# Patient Record
Sex: Female | Born: 1950 | Race: Black or African American | Hispanic: No | Smoking: Never smoker
Health system: Southern US, Community
[De-identification: ages and names within clinical notes are randomized; demographics above are authoritative.]

## PROBLEM LIST (undated history)

## (undated) DIAGNOSIS — E785 Hyperlipidemia, unspecified: Secondary | ICD-10-CM

## (undated) DIAGNOSIS — I10 Essential (primary) hypertension: Secondary | ICD-10-CM

## (undated) DIAGNOSIS — D72829 Elevated white blood cell count, unspecified: Secondary | ICD-10-CM

## (undated) DIAGNOSIS — E119 Type 2 diabetes mellitus without complications: Secondary | ICD-10-CM

## (undated) DIAGNOSIS — M1990 Unspecified osteoarthritis, unspecified site: Secondary | ICD-10-CM

## (undated) DIAGNOSIS — R1310 Dysphagia, unspecified: Secondary | ICD-10-CM

## (undated) DIAGNOSIS — R413 Other amnesia: Secondary | ICD-10-CM

## (undated) HISTORY — PX: TUBAL LIGATION: SHX77

## (undated) HISTORY — DX: Type 2 diabetes mellitus without complications: E11.9

## (undated) HISTORY — DX: Unspecified osteoarthritis, unspecified site: M19.90

## (undated) HISTORY — DX: Other amnesia: R41.3

## (undated) HISTORY — DX: Essential (primary) hypertension: I10

## (undated) HISTORY — DX: Hyperlipidemia, unspecified: E78.5

## (undated) HISTORY — DX: Dysphagia, unspecified: R13.10

## (undated) HISTORY — DX: Elevated white blood cell count, unspecified: D72.829

---

## 1997-03-07 DIAGNOSIS — M1990 Unspecified osteoarthritis, unspecified site: Secondary | ICD-10-CM

## 1997-03-07 HISTORY — DX: Unspecified osteoarthritis, unspecified site: M19.90

## 1998-07-24 ENCOUNTER — Encounter: Payer: Self-pay | Admitting: Emergency Medicine

## 1998-07-24 ENCOUNTER — Emergency Department (HOSPITAL_COMMUNITY): Admission: EM | Admit: 1998-07-24 | Discharge: 1998-07-24 | Payer: Self-pay | Admitting: Emergency Medicine

## 1999-04-16 ENCOUNTER — Encounter: Payer: Self-pay | Admitting: Obstetrics and Gynecology

## 1999-04-16 ENCOUNTER — Encounter: Admission: RE | Admit: 1999-04-16 | Discharge: 1999-04-16 | Payer: Self-pay | Admitting: Obstetrics and Gynecology

## 1999-10-22 ENCOUNTER — Encounter: Payer: Self-pay | Admitting: Obstetrics and Gynecology

## 1999-10-22 ENCOUNTER — Other Ambulatory Visit: Admission: RE | Admit: 1999-10-22 | Discharge: 1999-10-22 | Payer: Self-pay | Admitting: Obstetrics and Gynecology

## 1999-10-22 ENCOUNTER — Encounter: Admission: RE | Admit: 1999-10-22 | Discharge: 1999-10-22 | Payer: Self-pay | Admitting: Obstetrics and Gynecology

## 2000-11-27 ENCOUNTER — Encounter: Admission: RE | Admit: 2000-11-27 | Discharge: 2000-11-27 | Payer: Self-pay | Admitting: Family Medicine

## 2000-11-27 ENCOUNTER — Encounter: Payer: Self-pay | Admitting: Family Medicine

## 2002-02-13 ENCOUNTER — Ambulatory Visit (HOSPITAL_COMMUNITY): Admission: RE | Admit: 2002-02-13 | Discharge: 2002-02-13 | Payer: Self-pay | Admitting: Family Medicine

## 2003-03-08 DIAGNOSIS — E119 Type 2 diabetes mellitus without complications: Secondary | ICD-10-CM

## 2003-03-08 HISTORY — DX: Type 2 diabetes mellitus without complications: E11.9

## 2003-05-29 ENCOUNTER — Ambulatory Visit (HOSPITAL_COMMUNITY): Admission: RE | Admit: 2003-05-29 | Discharge: 2003-05-29 | Payer: Self-pay | Admitting: Family Medicine

## 2003-06-11 ENCOUNTER — Ambulatory Visit (HOSPITAL_COMMUNITY): Admission: RE | Admit: 2003-06-11 | Discharge: 2003-06-11 | Payer: Self-pay | Admitting: Family Medicine

## 2003-07-03 ENCOUNTER — Ambulatory Visit (HOSPITAL_COMMUNITY): Admission: RE | Admit: 2003-07-03 | Discharge: 2003-07-03 | Payer: Self-pay | Admitting: Family Medicine

## 2003-09-08 ENCOUNTER — Inpatient Hospital Stay (HOSPITAL_COMMUNITY): Admission: EM | Admit: 2003-09-08 | Discharge: 2003-09-11 | Payer: Self-pay | Admitting: Emergency Medicine

## 2003-09-09 ENCOUNTER — Encounter (INDEPENDENT_AMBULATORY_CARE_PROVIDER_SITE_OTHER): Payer: Self-pay | Admitting: Cardiology

## 2003-11-24 ENCOUNTER — Ambulatory Visit: Payer: Self-pay | Admitting: Family Medicine

## 2003-11-27 ENCOUNTER — Ambulatory Visit: Payer: Self-pay | Admitting: Family Medicine

## 2004-02-23 ENCOUNTER — Ambulatory Visit: Payer: Self-pay | Admitting: Family Medicine

## 2004-03-16 ENCOUNTER — Ambulatory Visit: Payer: Self-pay | Admitting: Family Medicine

## 2004-03-23 ENCOUNTER — Ambulatory Visit: Payer: Self-pay | Admitting: Family Medicine

## 2004-03-30 ENCOUNTER — Ambulatory Visit: Payer: Self-pay | Admitting: Family Medicine

## 2004-06-10 ENCOUNTER — Ambulatory Visit: Payer: Self-pay | Admitting: Family Medicine

## 2004-06-22 ENCOUNTER — Ambulatory Visit (HOSPITAL_COMMUNITY): Admission: RE | Admit: 2004-06-22 | Discharge: 2004-06-22 | Payer: Self-pay | Admitting: Family Medicine

## 2004-07-30 ENCOUNTER — Ambulatory Visit: Payer: Self-pay | Admitting: *Deleted

## 2004-10-01 ENCOUNTER — Ambulatory Visit: Payer: Self-pay | Admitting: Family Medicine

## 2004-12-21 ENCOUNTER — Ambulatory Visit: Payer: Self-pay | Admitting: Family Medicine

## 2005-05-05 ENCOUNTER — Ambulatory Visit: Payer: Self-pay | Admitting: Family Medicine

## 2005-05-05 LAB — CONVERTED CEMR LAB: Urinalysis: NORMAL

## 2005-08-09 ENCOUNTER — Ambulatory Visit (HOSPITAL_COMMUNITY): Admission: RE | Admit: 2005-08-09 | Discharge: 2005-08-09 | Payer: Self-pay | Admitting: Family Medicine

## 2005-09-19 ENCOUNTER — Encounter (INDEPENDENT_AMBULATORY_CARE_PROVIDER_SITE_OTHER): Payer: Self-pay | Admitting: Family Medicine

## 2005-09-19 ENCOUNTER — Ambulatory Visit: Payer: Self-pay | Admitting: Nurse Practitioner

## 2005-09-19 LAB — CONVERTED CEMR LAB
TSH: 1.048 microintl units/mL
WBC, blood: 9.6 10*3/uL

## 2005-10-20 ENCOUNTER — Ambulatory Visit: Payer: Self-pay | Admitting: Family Medicine

## 2006-01-16 ENCOUNTER — Ambulatory Visit: Payer: Self-pay | Admitting: Family Medicine

## 2006-02-22 ENCOUNTER — Ambulatory Visit: Payer: Self-pay | Admitting: Family Medicine

## 2006-03-27 ENCOUNTER — Ambulatory Visit: Payer: Self-pay | Admitting: Internal Medicine

## 2006-05-22 ENCOUNTER — Ambulatory Visit: Payer: Self-pay | Admitting: Family Medicine

## 2006-05-22 LAB — CONVERTED CEMR LAB
Hgb A1c MFr Bld: 5.7 %
Microalbumin U total vol: 0.53 mg/L

## 2006-07-27 ENCOUNTER — Ambulatory Visit: Payer: Self-pay | Admitting: Family Medicine

## 2006-08-15 ENCOUNTER — Ambulatory Visit (HOSPITAL_COMMUNITY): Admission: RE | Admit: 2006-08-15 | Discharge: 2006-08-15 | Payer: Self-pay | Admitting: Family Medicine

## 2006-11-16 ENCOUNTER — Encounter (INDEPENDENT_AMBULATORY_CARE_PROVIDER_SITE_OTHER): Payer: Self-pay | Admitting: Family Medicine

## 2006-11-16 DIAGNOSIS — E7800 Pure hypercholesterolemia, unspecified: Secondary | ICD-10-CM | POA: Insufficient documentation

## 2006-11-22 ENCOUNTER — Encounter (INDEPENDENT_AMBULATORY_CARE_PROVIDER_SITE_OTHER): Payer: Self-pay | Admitting: *Deleted

## 2006-12-21 ENCOUNTER — Ambulatory Visit: Payer: Self-pay | Admitting: Family Medicine

## 2006-12-21 LAB — CONVERTED CEMR LAB
ALT: 19 units/L (ref 0–35)
Calcium: 9.3 mg/dL (ref 8.4–10.5)
Creatinine, Ser: 0.69 mg/dL (ref 0.40–1.20)
HDL: 62 mg/dL (ref >39)
LDL Cholesterol: 88 mg/dL (ref 0–99)
Total CHOL/HDL Ratio: 2.6
VLDL: 13 mg/dL (ref 0–40)

## 2006-12-25 DIAGNOSIS — E669 Obesity, unspecified: Secondary | ICD-10-CM | POA: Insufficient documentation

## 2006-12-25 DIAGNOSIS — E119 Type 2 diabetes mellitus without complications: Secondary | ICD-10-CM | POA: Insufficient documentation

## 2006-12-25 DIAGNOSIS — M76899 Other specified enthesopathies of unspecified lower limb, excluding foot: Secondary | ICD-10-CM | POA: Insufficient documentation

## 2006-12-25 DIAGNOSIS — J8409 Other alveolar and parieto-alveolar conditions: Secondary | ICD-10-CM | POA: Insufficient documentation

## 2007-03-19 ENCOUNTER — Ambulatory Visit: Payer: Self-pay | Admitting: Internal Medicine

## 2007-03-21 ENCOUNTER — Ambulatory Visit: Payer: Self-pay | Admitting: Family Medicine

## 2007-04-26 ENCOUNTER — Ambulatory Visit: Payer: Self-pay | Admitting: Family Medicine

## 2007-04-26 ENCOUNTER — Encounter (INDEPENDENT_AMBULATORY_CARE_PROVIDER_SITE_OTHER): Payer: Self-pay | Admitting: Family Medicine

## 2007-07-02 ENCOUNTER — Ambulatory Visit: Payer: Self-pay | Admitting: Internal Medicine

## 2007-07-02 ENCOUNTER — Encounter (INDEPENDENT_AMBULATORY_CARE_PROVIDER_SITE_OTHER): Payer: Self-pay | Admitting: Family Medicine

## 2007-07-02 LAB — CONVERTED CEMR LAB
ALT: 16 units/L (ref 0–35)
AST: 19 units/L (ref 0–37)
Albumin: 4.3 g/dL (ref 3.5–5.2)
CO2: 24 meq/L (ref 19–32)
Calcium: 9.2 mg/dL (ref 8.4–10.5)
Potassium: 4.5 meq/L (ref 3.5–5.3)
Sodium: 140 meq/L (ref 135–145)

## 2007-08-21 ENCOUNTER — Ambulatory Visit (HOSPITAL_COMMUNITY): Admission: RE | Admit: 2007-08-21 | Discharge: 2007-08-21 | Payer: Self-pay | Admitting: Family Medicine

## 2007-08-28 ENCOUNTER — Emergency Department (HOSPITAL_COMMUNITY): Admission: EM | Admit: 2007-08-28 | Discharge: 2007-08-28 | Payer: Self-pay | Admitting: Family Medicine

## 2007-09-21 ENCOUNTER — Ambulatory Visit: Payer: Self-pay | Admitting: Family Medicine

## 2007-09-28 ENCOUNTER — Ambulatory Visit (HOSPITAL_COMMUNITY): Admission: RE | Admit: 2007-09-28 | Discharge: 2007-09-28 | Payer: Self-pay | Admitting: Family Medicine

## 2008-02-08 ENCOUNTER — Ambulatory Visit: Payer: Self-pay | Admitting: Internal Medicine

## 2008-05-19 ENCOUNTER — Ambulatory Visit: Payer: Self-pay | Admitting: Internal Medicine

## 2008-05-19 ENCOUNTER — Encounter: Payer: Self-pay | Admitting: Internal Medicine

## 2008-05-19 DIAGNOSIS — J019 Acute sinusitis, unspecified: Secondary | ICD-10-CM | POA: Insufficient documentation

## 2008-05-19 LAB — CONVERTED CEMR LAB: Blood Glucose, Fingerstick: 140

## 2008-06-07 ENCOUNTER — Emergency Department (HOSPITAL_COMMUNITY): Admission: EM | Admit: 2008-06-07 | Discharge: 2008-06-07 | Payer: Self-pay | Admitting: Emergency Medicine

## 2008-06-09 ENCOUNTER — Ambulatory Visit: Payer: Self-pay | Admitting: Family Medicine

## 2008-06-09 ENCOUNTER — Encounter: Payer: Self-pay | Admitting: Internal Medicine

## 2008-06-09 DIAGNOSIS — R519 Headache, unspecified: Secondary | ICD-10-CM | POA: Insufficient documentation

## 2008-06-09 DIAGNOSIS — R51 Headache: Secondary | ICD-10-CM | POA: Insufficient documentation

## 2008-06-09 LAB — CONVERTED CEMR LAB: Blood Glucose, Fingerstick: 127

## 2008-06-12 ENCOUNTER — Telehealth: Payer: Self-pay | Admitting: Internal Medicine

## 2008-06-19 ENCOUNTER — Ambulatory Visit: Payer: Self-pay | Admitting: Family Medicine

## 2008-06-19 LAB — CONVERTED CEMR LAB
ALT: 18 units/L (ref 0–35)
LDL Cholesterol: 112 mg/dL — ABNORMAL HIGH (ref 0–99)

## 2008-08-26 ENCOUNTER — Ambulatory Visit (HOSPITAL_COMMUNITY): Admission: RE | Admit: 2008-08-26 | Discharge: 2008-08-26 | Payer: Self-pay | Admitting: Family Medicine

## 2008-09-18 ENCOUNTER — Ambulatory Visit: Payer: Self-pay | Admitting: Family Medicine

## 2009-01-05 ENCOUNTER — Ambulatory Visit: Payer: Self-pay | Admitting: Family Medicine

## 2009-01-06 ENCOUNTER — Encounter (INDEPENDENT_AMBULATORY_CARE_PROVIDER_SITE_OTHER): Payer: Self-pay | Admitting: Family Medicine

## 2009-01-06 LAB — CONVERTED CEMR LAB
Chloride: 107 meq/L (ref 96–112)
Cholesterol: 145 mg/dL (ref 0–200)
HDL: 46 mg/dL (ref >39)
Potassium: 4.1 meq/L (ref 3.5–5.3)
Sodium: 143 meq/L (ref 135–145)
Total CHOL/HDL Ratio: 3.2
Triglycerides: 67 mg/dL (ref <150)
VLDL: 13 mg/dL (ref 0–40)

## 2009-01-20 ENCOUNTER — Ambulatory Visit: Payer: Self-pay | Admitting: Family Medicine

## 2009-04-07 ENCOUNTER — Ambulatory Visit: Payer: Self-pay | Admitting: Internal Medicine

## 2009-05-20 ENCOUNTER — Ambulatory Visit: Payer: Self-pay | Admitting: Family Medicine

## 2009-05-20 LAB — CONVERTED CEMR LAB
BUN: 18 mg/dL (ref 6–23)
CO2: 30 meq/L (ref 19–32)
Creatinine, Ser: 0.77 mg/dL (ref 0.40–1.20)
Glucose, Bld: 100 mg/dL — ABNORMAL HIGH (ref 70–99)
Sodium: 142 meq/L (ref 135–145)
Total Bilirubin: 0.4 mg/dL (ref 0.3–1.2)
Total Protein: 6.3 g/dL (ref 6.0–8.3)

## 2009-06-11 ENCOUNTER — Ambulatory Visit: Payer: Self-pay | Admitting: Family Medicine

## 2009-06-12 ENCOUNTER — Encounter (INDEPENDENT_AMBULATORY_CARE_PROVIDER_SITE_OTHER): Payer: Self-pay | Admitting: Family Medicine

## 2009-06-23 ENCOUNTER — Encounter (INDEPENDENT_AMBULATORY_CARE_PROVIDER_SITE_OTHER): Payer: Self-pay | Admitting: Adult Health

## 2009-06-23 ENCOUNTER — Ambulatory Visit (HOSPITAL_COMMUNITY): Admission: RE | Admit: 2009-06-23 | Discharge: 2009-06-23 | Payer: Self-pay | Admitting: Family Medicine

## 2009-06-23 ENCOUNTER — Telehealth (INDEPENDENT_AMBULATORY_CARE_PROVIDER_SITE_OTHER): Payer: Self-pay | Admitting: *Deleted

## 2009-06-23 ENCOUNTER — Ambulatory Visit: Payer: Self-pay | Admitting: Family Medicine

## 2009-06-23 LAB — CONVERTED CEMR LAB
Eosinophils Absolute: 0 10*3/uL (ref 0.0–0.7)
Hemoglobin: 14 g/dL (ref 12.0–15.0)
Lymphs Abs: 3.3 10*3/uL (ref 0.7–4.0)
MCV: 90.2 fL (ref 78.0–100.0)
Monocytes Relative: 10 % (ref 3–12)
Neutrophils Relative %: 57 % (ref 43–77)
RBC: 4.78 M/uL (ref 3.87–5.11)
WBC: 9.9 10*3/uL (ref 4.0–10.5)

## 2009-06-25 ENCOUNTER — Ambulatory Visit: Payer: Self-pay | Admitting: Internal Medicine

## 2009-06-25 ENCOUNTER — Emergency Department (HOSPITAL_COMMUNITY): Admission: EM | Admit: 2009-06-25 | Discharge: 2009-06-25 | Payer: Self-pay | Admitting: Emergency Medicine

## 2009-08-28 ENCOUNTER — Ambulatory Visit (HOSPITAL_COMMUNITY): Admission: RE | Admit: 2009-08-28 | Discharge: 2009-08-28 | Payer: Self-pay | Admitting: Family Medicine

## 2010-02-02 ENCOUNTER — Emergency Department (HOSPITAL_COMMUNITY)
Admission: EM | Admit: 2010-02-02 | Discharge: 2010-02-02 | Payer: Self-pay | Source: Home / Self Care | Admitting: Emergency Medicine

## 2010-02-22 ENCOUNTER — Ambulatory Visit (HOSPITAL_COMMUNITY)
Admission: RE | Admit: 2010-02-22 | Discharge: 2010-02-22 | Payer: Self-pay | Source: Home / Self Care | Attending: Family Medicine | Admitting: Family Medicine

## 2010-03-04 ENCOUNTER — Encounter (INDEPENDENT_AMBULATORY_CARE_PROVIDER_SITE_OTHER): Payer: Self-pay | Admitting: Family Medicine

## 2010-03-04 LAB — CONVERTED CEMR LAB
ALT: 9 units/L (ref 0–35)
CO2: 29 meq/L (ref 19–32)
Chloride: 103 meq/L (ref 96–112)
Creatinine, Ser: 0.75 mg/dL (ref 0.40–1.20)
HDL: 55 mg/dL (ref >39)
LDL Cholesterol: 85 mg/dL (ref 0–99)
Sodium: 142 meq/L (ref 135–145)
Total CHOL/HDL Ratio: 2.8

## 2010-03-28 ENCOUNTER — Encounter: Payer: Self-pay | Admitting: Internal Medicine

## 2010-04-06 NOTE — Progress Notes (Signed)
Summary: triage/weak dizzy  Phone Note Call from Patient   Caller: Patient Reason for Call: Talk to Nurse Summary of Call: patient states she "feels bad" dizzy and weak...patient is lying down and sounds weak over the phone..Voice strained.Marland KitchenMarland KitchenShe denies sore throat, coughing, and states she started feeling bad on Saturday night after eating 2 hotdogs at a friends.Marland KitchenMarland KitchenShe denies any ETOH use.  She says her son visited last week and he had a stomach virus..Patient denies diarrhea and says she vomited one Sunday morning but none since. Her urine is straw yellow and he last BM was 4/18 and normal..She denies any pain but states her chest feels full...she denies any heart problems or numbness or tingling in arms or hands.Marland KitchenMarland KitchenPatient is a diabetic and states her CBG's have been 93-130...she lives alone.Marland KitchenMarland KitchenShe was in the office on 06/11/09 with greenish sputum and sore throat cough/and feeling dizzy then.Marland KitchenMarland KitchenAppointment made for patient today..possible pneumonia or heart problems.Marland KitchenMarland KitchenShe hasn't taken her blood pressure medicine because her blood pressures have been around 112/70.. Initial call taken by: Conchita Paris,  June 23, 2009 9:21 AM

## 2010-05-25 LAB — URINALYSIS, ROUTINE W REFLEX MICROSCOPIC
Glucose, UA: NEGATIVE mg/dL
Ketones, ur: NEGATIVE mg/dL
Nitrite: NEGATIVE
Protein, ur: NEGATIVE mg/dL

## 2010-05-25 LAB — DIFFERENTIAL
Basophils Relative: 0 % (ref 0–1)
Eosinophils Absolute: 0 10*3/uL (ref 0.0–0.7)
Lymphs Abs: 3 10*3/uL (ref 0.7–4.0)
Monocytes Absolute: 0.8 10*3/uL (ref 0.1–1.0)
Monocytes Relative: 7 % (ref 3–12)
Neutro Abs: 6.8 10*3/uL (ref 1.7–7.7)

## 2010-05-25 LAB — BASIC METABOLIC PANEL
CO2: 24 mEq/L (ref 19–32)
Chloride: 103 mEq/L (ref 96–112)
GFR calc Af Amer: >60 mL/min (ref >60)
Sodium: 135 mEq/L (ref 135–145)

## 2010-05-25 LAB — CBC
Hemoglobin: 13.4 g/dL (ref 12.0–15.0)
MCHC: 33.9 g/dL (ref 30.0–36.0)
MCV: 91.1 fL (ref 78.0–100.0)
RBC: 4.33 MIL/uL (ref 3.87–5.11)
WBC: 10.7 10*3/uL — ABNORMAL HIGH (ref 4.0–10.5)

## 2010-05-25 LAB — GLUCOSE, CAPILLARY: Glucose-Capillary: 81 mg/dL (ref 70–99)

## 2010-05-25 LAB — URINE MICROSCOPIC-ADD ON

## 2010-05-25 LAB — POCT CARDIAC MARKERS: CKMB, poc: 1.6 ng/mL (ref 1.0–8.0)

## 2010-06-16 LAB — DIFFERENTIAL
Eosinophils Absolute: 0 10*3/uL (ref 0.0–0.7)
Eosinophils Relative: 0 % (ref 0–5)
Lymphocytes Relative: 4 % — ABNORMAL LOW (ref 12–46)
Lymphs Abs: 0.4 10*3/uL — ABNORMAL LOW (ref 0.7–4.0)
Monocytes Absolute: 0.2 10*3/uL (ref 0.1–1.0)
Monocytes Relative: 2 % — ABNORMAL LOW (ref 3–12)

## 2010-06-16 LAB — CBC
HCT: 38.8 % (ref 36.0–46.0)
Hemoglobin: 13.2 g/dL (ref 12.0–15.0)
MCV: 89.8 fL (ref 78.0–100.0)
Platelets: 178 10*3/uL (ref 150–400)
RBC: 4.32 MIL/uL (ref 3.87–5.11)
WBC: 10.4 10*3/uL (ref 4.0–10.5)

## 2010-06-16 LAB — POCT I-STAT, CHEM 8
BUN: 11 mg/dL (ref 6–23)
Creatinine, Ser: 0.8 mg/dL (ref 0.4–1.2)
Glucose, Bld: 123 mg/dL — ABNORMAL HIGH (ref 70–99)
Hemoglobin: 13.9 g/dL (ref 12.0–15.0)
Sodium: 139 mEq/L (ref 135–145)
TCO2: 25 mmol/L (ref 0–100)

## 2010-06-16 LAB — URINALYSIS, ROUTINE W REFLEX MICROSCOPIC
Bilirubin Urine: NEGATIVE
Glucose, UA: NEGATIVE mg/dL
Hgb urine dipstick: NEGATIVE
Ketones, ur: NEGATIVE mg/dL
Nitrite: NEGATIVE
Specific Gravity, Urine: 1.008 (ref 1.005–1.030)
pH: 8 (ref 5.0–8.0)

## 2010-06-16 LAB — POCT CARDIAC MARKERS
CKMB, poc: <1 ng/mL — ABNORMAL LOW (ref 1.0–8.0)
Myoglobin, poc: 53.4 ng/mL (ref 12–200)

## 2010-06-16 LAB — GLUCOSE, CAPILLARY

## 2010-07-23 NOTE — Discharge Summary (Signed)
NAME:  Linda Arnold, Linda Arnold                         ACCOUNT NO.:  [id number]   MEDICAL RECORD NO.:  [id number]                   PATIENT TYPE:  INP   LOCATION:  5506                                 FACILITY:  [facility]   PHYSICIAN:  Isla Pence, M.D.             DATE OF BIRTH:  12-12-1950   DATE OF ADMISSION:  09/05/2003  DATE OF DISCHARGE:  09/11/2003                                 DISCHARGE SUMMARY   DISCHARGE DIAGNOSES:  1. Newly diagnosed diabetes mellitus, type 2, aggravated by high-dose     prednisone.  2. 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  3. Gastrointestinal reflux disease by symptoms.  4. Hyperlipidemia.  5. Chronic prednisone use with risk for osteoporosis.  6. The patient is status post tubal ligation in 1980.  7. Mild pneumomediastinum with no further intervention recommended by     pulmonary.   DISCHARGE MEDICATIONS:  1. The patient is to continue her Pravachol as she has been taking at home.     She did not tell us the dose, but per Dr. Thurston Hole notes says 10 mg p.o.     daily.  2. Nexium 40 mg p.o. b.i.d.  3. Ecotrin 81 mg p.o. daily since she has hyperlipidemia.  4. Calcium carbonate one tablet p.o. t.i.d.  The patient is to take it with     meals.  5. Vitamin D 400 international units p.o. daily.  6. Miacalcin nasal spray one spray alternating nostrils daily.  7. Glyburide 2.5 mg p.o. b.i.d.  8. Lantus 20 units subcutaneously q.h.s.  Both the glyburide and the Lantus     are to be adjusted once the prednisone is tapered down.  This adjustment     will be made by Dr. Audria Nine.  9. Prednisone 20 mg p.o. t.i.d. x 5 more days, then 40 mg p.o. q.a.m. and 10     mg p.o. each evening for three days, then 40 mg p.o. daily for four days,     then 30 mg p.o. daily for three days, then 20 mg p.o. daily for three     days, then 10 mg p.o. daily for two days, and then to discontinue.  The     patient is to take the prednisone with  meals.   DISCHARGE ACTIVITY:  As tolerated.   DISCHARGE DIET:  An 1800 calorie diabetic diet.   SPECIAL INSTRUCTIONS:  The patient has been taught how to check her sugars  and also to give herself her insulin.  The patient is to check her sugars  twice a day and she has been instructed that once her prednisone is tapered  down her diabetes medications will need to be adjusted.   FOLLOWUP:  Follow up with Dr. Audria Nine at [facility] in one week.  She is  to bring her sugar readings with her.  Follow up with Dr. Sherene Sires in two weeks.  The patient has been  given his clinic number to set up the appointment with  Dr. Sherene Sires.   HOSPITAL COURSE:  This is a 60 year old African American female with a past  medical history of hyperlipidemia.  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  Now that we have  Dr. 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  The patient was supposed to have followed up with him  and somehow missed that appointment.  In any case, the patient had been on  prednisone 20 mg p.o. q.i.d.  The patient presented to the emergency room  with high sugars.  Please refer to Dr. Bobbye Charleston initial H&P for  the details of her presentation.  In any case, the patient was brought in  and started on IV fluids and insulin.  The patient was subsequently switched  to Lantus.  Her sugars have trended down very nicely.  On the day of  discharge they are running in the mid 200 to upper 200 range.  The patient's  Lantus was subsequently increased.  At the time of discharge, she is to be  taking 20 units subcutaneously q.h.s.  In addition, she was started on  glyburide.  She is to remain on 2.5 mg p.o. b.i.d. for the time being.  I  temporarily started her on metformin, however, since we needed to do IV  contrast for her CT scan, I have since taken her off of metformin.  The  decision  for discontinuing the metformin was also based on the fact that  since her prednisone is going to be tapered down as was recommended by Dr.  Sherene Sires, her sugars will come down nicely and therefore she may not necessarily  need two oral medications for her diabetes mellitus.  Her hemoglobin A1C of  11.9 certainly suggests that she will need medications for treatment of her  diabetes mellitus.  Dr. Audria Nine can make further adjustments on her  diabetes medications.   In regards to her underlying respiratory disease, the patient was continued  on her high-dose prednisone since she had apparently told us she had been on  this for at least two months.  Reviewing Dr. Thurston Hole records, I gather this  prednisone was actually started on Jul 16, 2003, and she was to take 40 mg  b.i.d.  At that time, when he had initially seen her on Jul 16, 2003, he had  diagnosed her with the possibility of having bronchitis obliterans with  organized pneumonia.  He had also at that time recommended that she take  Nexium 40 mg p.o. b.i.d., which she had failed to do.  Some of this was  because most of her presenting symptoms were cough in addition to the chest  discomfort that she has continued to have.  I officially consulted pulmonary  on Jul 11, 2003, because of the fact that she has been on high-dose  prednisone and the patient was concerned about this, especially with her now  being a diabetic.  Pulmonary had recommended that we go ahead and start  tapering her prednisone and to do a CT of her chest.  She had an initial CT  done in April of 2005.  The repeat CT was done on Jul 12, 2003, and the  unofficial report that we have shows mild pneumomediastinum, no  pneumothorax, and pulmonary fibrosis similar to the one in April.  There was  no adenopathy or infiltrate.  This CT was read by Dr. Purcell Mouton.  This information was discussed with Dr. Sherene Sires and he was okay with the patient  being discharged.  In regards to her  pneumomediastinum, he did not recommend  any further treatment.  He was also comfortable with discharging the patient  since she is medically stable for that.  He had recommended sedimentation  rate and BNP to be done.  The BNP had come back at 46.1, which is  essentially unremarkable.  Her sedimentation rate is still pending.  She did  have an echocardiogram done as part of the workup to ensure that this was  not suggestive of congestive heart failure and overall her echocardiogram  showed no evidence of cardiac involvement causing changes in her pulmonary  status.  Her echocardiogram showed left ventricular size was normal, overall  left ventricular systolic function was normal, her EF was 55-65%, and her  left ventricular wall thickness was also normal.  There was just mild  tricuspid valvular regurgitation, but otherwise negative.  There was mention  of trivial pericardial effusion circumferential to the heart, but it  certainly did not effect her left ventricular function at all.  Per my  discussion with Dr. Sherene Sires, he recommended continued tapering off of her  prednisone to off and for her to follow up with him in his clinic in  approximately two weeks.  The patient was placed on O2 just because of the  diagnosis of pneumomediastinum as noted on the chest x-ray.  The repeat  chest x-ray did show decrease in the pneumomediastinum and as mentioned in  the CT scan from today this is very mild and pulmonary and recommended no  further treatment.  The patient will be discharged without O2 as I was  advised that this was no longer needed.  There should be resolution of the  pneumomediastinum with time.   Her BMP done on the day of discharge shows a sodium of 135, potassium 3.8,  chloride 99, CO2 31, BUN 13, creatinine 0.7, and glucose 263.   Since the patient has been on chronic prednisone for a period of time, it  puts her at higher risk for osteoporosis.  Therefore, calcium and vitamin  D  were initiated.  Because of the concern for heartburn, which she describes,  instead of using Fosamax, I have given her a prescription for Miacalcin  nasal spray.   The patient is stable for discharge.  Her sugars have come down into a more  manageable level.  She has been once again taught on how to give herself  insulin and to check sugars.  We do know that she has done well with  previous administration of IV contrast and except no further trouble with  this.  I had started her on IV fluids an hour before the contrast and had  kept her on IV fluids for a few hours post contrast to ensure everything  will be okay.  Once again, her BNP on the morning of discharge is 0.7.  Dr.  Audria Nine can certainly do a repeat BMET on her at the time of her visit  with her.  Once again, the patient has been advised to bring her sugar  readings at the appointment date with Dr. Audria Nine.  The patient is being discharged to home in stable condition.  Isla Pence, M.D.    RRV/MEDQ  D:  09/11/2003  T:  09/12/2003  Job:  [id number]   cc:   Maurice March, M.D.  [address] [place]  [place] [address]  Fax: [phone]   Charlaine Dalton. Sherene Sires, M.D. [facility]

## 2010-07-23 NOTE — H&P (Signed)
NAME:  Linda Arnold, Linda Arnold                         ACCOUNT NO.:  [id number]   MEDICAL RECORD NO.:  [id number]                   PATIENT TYPE:  OBV   LOCATION:  1823                                 FACILITY:  [facility]   PHYSICIAN:  Hollice Espy, M.D.            DATE OF BIRTH:  [birth year]   DATE OF ADMISSION:  09/05/2003  DATE OF DISCHARGE:                                HISTORY & PHYSICAL   PRIMARY CARE PHYSICIAN:  Dr. Dow Adolph, [facility].   HISTORY OF PRESENT ILLNESS:  This is a 60 year old African American female  with a past medical history of hyperlipidemia and was recently diagnosed  with rheumatic disease of the lung two months ago.  At that time, she  started being followed by Dr. Sherene Sires of [facility] and was started on  high-dose prednisone, specifically 20 mg p.o. four times a day.  The patient  tells me that since that time, she has continued to have episodes of chest  pain, but they sort of come and go.  She describes it as just sort of a  spasm or sharp pain in her chest occuring for pretty much the past two  months.  She has noticed it increasing and worsening as of late, in  severity, but again almost continuous.  She followed up with her primary  care physician today, Dr. Audria Nine.  At that time during routine lab work,  her blood sugar was noted to be in the 400's.  Reported Dr. Audria Nine had  done a screening diabetes test in the patient a few months back, and it was  normal.  They were concerned because of the high sugars, the patient was  brought into the emergency room.  In the emergency room, the patient was  noted to have a blood sugar of 939.  In addition, also of concern is the  patient's chest x-ray showed evidence of a pneumomediastinum.  The patient  had a blood gas checked which was actually found to be normal essentially  with a pO2 of 79 which is at the low-end of normal.  The patient was started  on oxygen and [facility] were  consulted.  The patient currently  feels okay. She denies any current chest pains.  She does have some  occasional spasms in the same area.  She denies any shortness of breath,  pain radiation, diaphoresis, or syncope or presyncope with these symptoms.  An EKG and cardiac enzymes were checked and found to be normal.  The patient  otherwise had no complaints.  She denies any shortness of breath, wheeze or  cough.  She denies any palpitations.  She denies any headaches, visual  changes, dysphagia.  She denies any abdominal pain, hematuria, dysuria,  constipation, diarrhea, nausea, vomiting or focal extremity weakness.  She  does feel quite drained.  She has had a poor appetite, and tells me she has  lost weight over the  past few months since being on the steroids.   PAST MEDICAL HISTORY:  Hyperlipidemia, recently diagnoses rheumatic disease  of the lung.   MEDICATIONS:  1. Prednisone 20 mg four times a day.  2. Pravachol although she is not sure of the dose.   ALLERGIES:  No known drug allergies.   SOCIAL HISTORY:  She denies any tobacco, alcohol or drug use.   FAMILY HISTORY:  Noncontributory.   PHYSICAL EXAMINATION:  VITAL SIGNS:  On admission, temperature 97.8, heart  rate 97, blood pressure 131/82, respirations initially were 44, but now at  20.  O2 saturations 98% on room air.  GENERAL:  She is in no apparent distress, appeared slightly fatigued, alert  and oriented x3.  HEENT:  Normocephalic, atraumatic.  She has slightly dry mucous membranes.  She has no carotid bruits.  HEART:  Regular rate and rhythm. S1, S2.  LUNGS:  She has some slightly-decreased breath sounds on the upper apices of  her left lung, but otherwise clear to auscultation bilaterally.  ABDOMEN:  Soft, nontender, nondistended.  Positive bowel sounds.  EXTREMITIES:  No cyanosis, clubbing or edema.  PULSES:  She has approximately 1+ pulses and no sensory deficits.   LABORATORY DATA:  Lipase 29, sodium 135,  potassium 4.4, chloride 100,  bicarbonate 24, BUN 12, creatinine 1, glucose 592, calcium 8.7.  Total  protein 6.6, and the rest of her LFT's are within normal limits.  White  count 9.8.  H&H 13.2 and 39.3.  MCV of 86.  Platelet count 231,000.  She had  a 79% shift.  CPK-MB is 31.9 and 1.9.  Troponin I is less than 0.05.  PH is  7.44, PCO2 of 38, PO2 79, bicarbonate 26, O2 saturations 96%.  Further set  of cardiac enzymes are normal as well.  EKG is normal sinus rhythm.  A chest  x-ray shows again evidence of some bibasilar atelectasis which is present on  previous chest x-rays checked in the past. However, she does have new  pneumomediastinum.   ASSESSMENT/PLAN:  1. Pneumomediastinum, likely a complications of her rheumatic lung disease.     It appears to be stable.  We will repeat a chest x-ray in the morning,     put her on oxygen, and ask pulmonary, Dr. Sherene Sires, to see who already     follows he as an outpatient setting.  2. Rheumatic lung disease diagnosed two months ago.  She is on high-dose     prednisone.  We will continue this for now.  3. Hyperglycemia, likely secondary to the patient's chronic high-dose     steroids.  We will start with 15 units of subcu insulin, plus sliding     scale.  If we still have continued persistent problems with her sugars,     we may have to change her over to an insulin glucomander, although     suspect it is chronic, and that is a problem with checking an A1c.  I can     not rule out diabetes at this point, given that if she has been on high-     dose prednisone for the past two months, likely her A1c will be elevated.  4. Hyperlipidemia.  We will go ahead and continue a statin.  Hollice Espy, M.D.    SKK/MEDQ  D:  09/06/2003  T:  09/06/2003  Job:  [id number]   cc:   Maurice March, M.D.  [address] [place]  [place] [address]  Fax: [phone]  Charlaine Dalton. Sherene Sires, M.D. [facility]

## 2010-07-28 ENCOUNTER — Other Ambulatory Visit (HOSPITAL_COMMUNITY): Payer: Self-pay | Admitting: Family Medicine

## 2010-07-28 DIAGNOSIS — Z1231 Encounter for screening mammogram for malignant neoplasm of breast: Secondary | ICD-10-CM

## 2010-08-03 ENCOUNTER — Other Ambulatory Visit: Payer: Self-pay | Admitting: Family Medicine

## 2010-08-31 ENCOUNTER — Ambulatory Visit (HOSPITAL_COMMUNITY)
Admission: RE | Admit: 2010-08-31 | Discharge: 2010-08-31 | Disposition: A | Discharge status: Home / Self Care | Payer: Self-pay | Source: Ambulatory Visit | Attending: Family Medicine | Admitting: Family Medicine

## 2010-08-31 DIAGNOSIS — Z1231 Encounter for screening mammogram for malignant neoplasm of breast: Secondary | ICD-10-CM | POA: Insufficient documentation

## 2010-12-02 LAB — POCT URINALYSIS DIP (DEVICE)
Bilirubin Urine: NEGATIVE
Glucose, UA: NEGATIVE
Nitrite: NEGATIVE
Operator id: 235561
Specific Gravity, Urine: 1.025
Urobilinogen, UA: 0.2

## 2011-07-26 ENCOUNTER — Other Ambulatory Visit (HOSPITAL_COMMUNITY): Payer: Self-pay | Admitting: Family Medicine

## 2011-07-26 DIAGNOSIS — Z1231 Encounter for screening mammogram for malignant neoplasm of breast: Secondary | ICD-10-CM

## 2011-09-01 ENCOUNTER — Ambulatory Visit (HOSPITAL_COMMUNITY)
Admission: RE | Admit: 2011-09-01 | Discharge: 2011-09-01 | Disposition: A | Discharge status: Home / Self Care | Payer: Self-pay | Source: Ambulatory Visit | Attending: Family Medicine | Admitting: Family Medicine

## 2011-09-01 DIAGNOSIS — Z1231 Encounter for screening mammogram for malignant neoplasm of breast: Secondary | ICD-10-CM | POA: Insufficient documentation

## 2012-02-06 ENCOUNTER — Ambulatory Visit (INDEPENDENT_AMBULATORY_CARE_PROVIDER_SITE_OTHER): Payer: Self-pay | Admitting: Family Medicine

## 2012-02-06 ENCOUNTER — Encounter: Payer: Self-pay | Admitting: Family Medicine

## 2012-02-06 VITALS — BP 147/85 | HR 81 | Temp 98.0°F | Ht 63.0 in | Wt 234.9 lb

## 2012-02-06 DIAGNOSIS — J8409 Other alveolar and parieto-alveolar conditions: Secondary | ICD-10-CM

## 2012-02-06 DIAGNOSIS — M7989 Other specified soft tissue disorders: Secondary | ICD-10-CM

## 2012-02-06 DIAGNOSIS — I10 Essential (primary) hypertension: Secondary | ICD-10-CM

## 2012-02-06 DIAGNOSIS — E119 Type 2 diabetes mellitus without complications: Secondary | ICD-10-CM

## 2012-02-06 DIAGNOSIS — E669 Obesity, unspecified: Secondary | ICD-10-CM

## 2012-02-06 LAB — CBC WITH DIFFERENTIAL/PLATELET
Basophils Absolute: 0 10*3/uL (ref 0.0–0.1)
Basophils Relative: 0 % (ref 0–1)
Eosinophils Absolute: 0.1 10*3/uL (ref 0.0–0.7)
Eosinophils Relative: 1 % (ref 0–5)
MCH: 30.6 pg (ref 26.0–34.0)
MCHC: 33.1 g/dL (ref 30.0–36.0)
MCV: 92.5 fL (ref 78.0–100.0)
Platelets: 192 10*3/uL (ref 150–400)
RDW: 13.3 % (ref 11.5–15.5)

## 2012-02-06 LAB — COMPREHENSIVE METABOLIC PANEL
ALT: 18 U/L (ref 0–35)
Albumin: 4.4 g/dL (ref 3.5–5.2)
Alkaline Phosphatase: 61 U/L (ref 39–117)
Potassium: 4.1 mEq/L (ref 3.5–5.3)
Sodium: 143 mEq/L (ref 135–145)
Total Bilirubin: 0.5 mg/dL (ref 0.3–1.2)
Total Protein: 6.8 g/dL (ref 6.0–8.3)

## 2012-02-06 LAB — POCT GLYCOSYLATED HEMOGLOBIN (HGB A1C): Hemoglobin A1C: 5.9

## 2012-02-06 MED ORDER — INSULIN GLARGINE 100 UNIT/ML ~~LOC~~ SOLN: 15.0000 [IU] | Freq: Every day | SUBCUTANEOUS | Status: DC

## 2012-02-06 MED ORDER — CELECOXIB 200 MG PO CAPS: 200.0000 mg | ORAL_CAPSULE | Freq: Two times a day (BID) | ORAL | Status: DC

## 2012-02-06 MED ORDER — LISINOPRIL 10 MG PO TABS: 10.0000 mg | ORAL_TABLET | Freq: Every day | ORAL | Status: DC

## 2012-02-06 MED ORDER — PRAVASTATIN SODIUM 20 MG PO TABS: 20.0000 mg | ORAL_TABLET | Freq: Every day | ORAL | Status: DC

## 2012-02-06 NOTE — Assessment & Plan Note (Signed)
Uncertain etiology, could be a bunion type reaction. Does not appear infectious or inflammatory. Check plain film and f/u results for now.

## 2012-02-06 NOTE — Assessment & Plan Note (Addendum)
Slightly above goal but only for past week or so during time of over-eating. We discussed trial of diet and exercise. F/u in one month and consider medications changes if indicated, could increase ACEi. Check labs today.

## 2012-02-06 NOTE — Assessment & Plan Note (Signed)
A1c is well below goal. Given lack of benefit from over-treatment, we agree to decrease lantus to 15 units from 20 units. Will re-assess in 1 month with CBG log. Check renal function, CBC, etc. Unsure why metformin was stopped, may be a candidate for this if control worsens.

## 2012-02-06 NOTE — Assessment & Plan Note (Signed)
Off steroids for some time now. She denies pulmonary symptoms. Is UTD on pneumovax and she refuses flu shot today.

## 2012-02-06 NOTE — Patient Instructions (Addendum)
Make an appointment with Linda Arnold for new orange card. Go to the Health department or [facility] for your medications. Apply for Medication Assistance Program at [facility] Department for Lantus prescription. Please submit stool card to check for blood yearly. Try to return to your healthy pre-holiday diet to see if blood pressure gets better. You can decrease your lantus to 15 units daily because your A1c is very low goal is <7.0. Get your foot xray done when you can. Make appointment in 1-2 months for check up.

## 2012-02-06 NOTE — Assessment & Plan Note (Signed)
Offered nutrition counseling, patient prefers to implement her knowledge before she returns to this as she thinks is more a will power issue for dietary control. F/u in 1-2 months.

## 2012-02-06 NOTE — Progress Notes (Signed)
  Subjective:    Patient ID: Linda Arnold, female    DOB: 1950/09/30, 61 y.o.   MRN: [id number]  HPI  New patient to establish care. Previous [facility].  1. DM2. Diagnosed in 2005 after hospitalization for bad pneumonia requiring steroids. She has been on lantus 30 and now down to 20 units 3 months ago. Has poor dietary compliance during thanksgiving but states CBGs range at highest in 200 range. Had pneumonia vaccine in 2011. No episodes of hypoglycemia. Denies polyuria, polyphagia, polydipsia.  2. HTN. Normally is well controlled in 110s systolic. Brings log from past one month and notices elevation to 140s over the past 2 weeks only. She admits has been eating salty and heavy foods over thanksgiving.   3. Knot on left foot. She is concerned noticing a prominent knot on dorsum of left foot. This is non-tender but is getting larger. Denies any trauma, tissue edema, redness, falls, numbness, tingling.   Past Medical History  Diagnosis Date  . Arthritis 1999    knees  . Diabetes mellitus without complication 2005  . Hyperlipidemia   . Hypertension    Family History  Problem Relation Age of Onset  . Diabetes Mother   . Diabetes Sister   . Hyperlipidemia Sister   . Hypertension Sister   . Cancer Brother 51    Lung cancer x2 brothers  . Hypertension Brother    Past Surgical History  Procedure Date  . Cesarean section   . Tubal ligation    History   Social History  . Marital Status: Widowed    Spouse Name: N/A    Number of Children: N/A  . Years of Education: N/A   Occupational History  . Not on file.   Social History Main Topics  . Smoking status: Not on file  . Smokeless tobacco: Not on file  . Alcohol Use: No  . Drug Use: No  . Sexually Active: Not Currently   Other Topics Concern  . Not on file   Social History Narrative   Widowed and unemployed. Attended some college. Walks infrequently for exercise. No tobacco or drug use. Previously went to [employer]  until 2013.   Review of Systems Denies dyspnea, cough, chest pain, abd pain, diarrhea, blood in stool, weight changes, edema, visual changes, fatigue, fever, chills, rash, mood changes, depression, dry mouth, myalgias.    Objective:   Physical Exam  Vitals reviewed. Constitutional: She is oriented to person, place, and time. She appears well-developed and well-nourished. No distress.  HENT:  Head: Normocephalic and atraumatic.  Mouth/Throat: Oropharynx is clear and moist.  Eyes: Pupils are equal, round, and reactive to light.       Right eye exotropia  Neck: Neck supple. No tracheal deviation present. No thyromegaly present.  Cardiovascular: Normal rate, regular rhythm and normal heart sounds.   No murmur heard. Pulmonary/Chest: Effort normal and breath sounds normal. No respiratory distress. She has no wheezes. She has no rales.  Abdominal: Soft.  Musculoskeletal: She exhibits no edema and no tenderness.       Left foot dorsum has nodular/bony prominence at dorsum near distal 3rd metatarsal. Nontender, no edema or skin changes. Distal toe movement intact.   Neurological: She is alert and oriented to person, place, and time.  Skin: No rash noted. She is not diaphoretic.  Psychiatric: She has a normal mood and affect. Her behavior is normal.        Assessment & Plan:

## 2012-02-07 ENCOUNTER — Encounter: Payer: Self-pay | Admitting: Family Medicine

## 2012-02-08 ENCOUNTER — Ambulatory Visit (HOSPITAL_COMMUNITY)
Admission: RE | Admit: 2012-02-08 | Discharge: 2012-02-08 | Disposition: A | Discharge status: Home / Self Care | Payer: Self-pay | Source: Ambulatory Visit | Attending: Family Medicine | Admitting: Family Medicine

## 2012-02-08 DIAGNOSIS — M7989 Other specified soft tissue disorders: Secondary | ICD-10-CM | POA: Insufficient documentation

## 2012-02-14 LAB — POC HEMOCCULT BLD/STL (HOME/3-CARD/SCREEN)
Card #2 Fecal Occult Blod, POC: NEGATIVE
Card #3 Fecal Occult Blood, POC: NEGATIVE
Fecal Occult Blood, POC: NEGATIVE

## 2012-02-14 NOTE — Addendum Note (Signed)
Addended by: [place], Tatem Holsonback on: 02/14/2012 01:42 PM   Modules accepted: Orders

## 2012-03-14 ENCOUNTER — Other Ambulatory Visit: Payer: Self-pay | Admitting: Family Medicine

## 2012-03-14 MED ORDER — CELECOXIB 200 MG PO CAPS: 200.0000 mg | ORAL_CAPSULE | Freq: Every day | ORAL | Status: DC | PRN

## 2012-03-15 ENCOUNTER — Encounter: Payer: Self-pay | Admitting: Family Medicine

## 2012-03-15 ENCOUNTER — Ambulatory Visit (INDEPENDENT_AMBULATORY_CARE_PROVIDER_SITE_OTHER): Payer: Self-pay | Admitting: Family Medicine

## 2012-03-15 VITALS — BP 137/69 | HR 71 | Temp 98.6°F | Ht 63.0 in | Wt 228.2 lb

## 2012-03-15 DIAGNOSIS — Z23 Encounter for immunization: Secondary | ICD-10-CM

## 2012-03-15 DIAGNOSIS — E119 Type 2 diabetes mellitus without complications: Secondary | ICD-10-CM

## 2012-03-15 DIAGNOSIS — I10 Essential (primary) hypertension: Secondary | ICD-10-CM

## 2012-03-15 NOTE — Assessment & Plan Note (Signed)
At goal, no changes today. Labs look good last month. F/u 3 months.

## 2012-03-15 NOTE — Progress Notes (Signed)
  Subjective:    Patient ID: Linda Arnold, female    DOB: 1950/07/24, 62 y.o.   MRN: [id number]  HPI  1. F/u DM2. Reports good glycemic values 155 post prandial in evening and fasting 90-102 after decreasing her lantus dose. Feels well. No polyuria, polydipsia, foot wounds, numbness or extremitiy pain. Has seen opthalmologist in November-has cataracts but no retinopathy. Requests dental referral for cleaning.   2. HTN. Improved today without any increased activity or med changes. No visual changes, edema, chest pain, palplitiations.   Review of Systems See HPI otherwise negative.  reports that she has never smoked. She does not have any smokeless tobacco history on file.     Objective:   Physical Exam  Vitals reviewed. Constitutional: She appears well-developed and well-nourished. No distress.  HENT:  Head: Normocephalic and atraumatic.  Mouth/Throat: Oropharynx is clear and moist.  Pulmonary/Chest: Effort normal.  Musculoskeletal: She exhibits no edema and no tenderness.       Left bone spur on dorsum of foot. No edema or tenderness. Left bunion-no tenderness. Normal monofilament test B feet.  Skin: She is not diaphoretic.          Assessment & Plan:

## 2012-03-15 NOTE — Patient Instructions (Signed)
Your blood pressure is good today. Goal is <140/90. Sounds like your blood sugars are well controlled. Your feet look good. No sign of neuropathy. Your foot xray shows spur.  Make follow in 4 months for diabetes.  Diabetes and Exercise Regular exercise is important and can help:   Control blood glucose (sugar).  Decrease blood pressure.    Control blood lipids (cholesterol, triglycerides).  Improve overall health. BENEFITS FROM EXERCISE  Improved fitness.  Improved flexibility.  Improved endurance.  Increased bone density.  Weight control.  Increased muscle strength.  Decreased body fat.  Improvement of the body's use of insulin, a hormone.  Increased insulin sensitivity.  Reduction of insulin needs.  Reduced stress and tension.  Helps you feel better. People with diabetes who add exercise to their lifestyle gain additional benefits, including:  Weight loss.  Reduced appetite.  Improvement of the body's use of blood glucose.  Decreased risk factors for heart disease:  Lowering of cholesterol and triglycerides.  Raising the level of good cholesterol (high-density lipoproteins, HDL).  Lowering blood sugar.  Decreased blood pressure. TYPE 1 DIABETES AND EXERCISE  Exercise will usually lower your blood glucose.  If blood glucose is greater than 240 mg/dl, check urine ketones. If ketones are present, do not exercise.  Location of the insulin injection sites may need to be adjusted with exercise. Avoid injecting insulin into areas of the body that will be exercised. For example, avoid injecting insulin into:  The arms when playing tennis.  The legs when jogging. For more information, discuss this with your caregiver.  Keep a record of:  Food intake.  Type and amount of exercise.  Expected peak times of insulin action.  Blood glucose levels. Do this before, during, and after exercise. Review your records with your caregiver. This will help you  to develop guidelines for adjusting food intake and insulin amounts.  TYPE 2 DIABETES AND EXERCISE  Regular physical activity can help control blood glucose.  Exercise is important because it may:  Increase the body's sensitivity to insulin.  Improve blood glucose control.  Exercise reduces the risk of heart disease. It decreases serum cholesterol and triglycerides. It also lowers blood pressure.  Those who take insulin or oral hypoglycemic agents should watch for signs of hypoglycemia. These signs include dizziness, shaking, sweating, chills, and confusion.  Body water is lost during exercise. It must be replaced. This will help to avoid loss of body fluids (dehydration) or heat stroke. Be sure to talk to your caregiver before starting an exercise program to make sure it is safe for you. Remember, any activity is better than none.  Document Released: 05/14/2003 Document Revised: 05/16/2011 Document Reviewed: 08/28/2008 [facility] Patient Information 2013 [place], [place].

## 2012-03-15 NOTE — Assessment & Plan Note (Signed)
Good glycemic control with decrease in lantus. F/u in 3-4 months for A1c. Encouraged exercise. Flu shot today. Foot exam wnl.

## 2012-08-06 ENCOUNTER — Other Ambulatory Visit: Payer: Self-pay | Admitting: Family Medicine

## 2012-08-06 DIAGNOSIS — Z1231 Encounter for screening mammogram for malignant neoplasm of breast: Secondary | ICD-10-CM

## 2012-08-06 MED ORDER — LISINOPRIL 10 MG PO TABS: 10.0000 mg | ORAL_TABLET | Freq: Every day | ORAL | Status: DC

## 2012-08-06 NOTE — Telephone Encounter (Signed)
Faxed back refill request to [facility].

## 2012-08-08 ENCOUNTER — Encounter: Payer: Self-pay | Admitting: Family Medicine

## 2012-08-08 ENCOUNTER — Ambulatory Visit (INDEPENDENT_AMBULATORY_CARE_PROVIDER_SITE_OTHER): Payer: PRIVATE HEALTH INSURANCE | Admitting: Family Medicine

## 2012-08-08 VITALS — BP 115/72 | HR 75 | Temp 98.7°F | Ht 63.0 in | Wt 228.1 lb

## 2012-08-08 DIAGNOSIS — M159 Polyosteoarthritis, unspecified: Secondary | ICD-10-CM | POA: Insufficient documentation

## 2012-08-08 DIAGNOSIS — E119 Type 2 diabetes mellitus without complications: Secondary | ICD-10-CM

## 2012-08-08 DIAGNOSIS — I10 Essential (primary) hypertension: Secondary | ICD-10-CM

## 2012-08-08 DIAGNOSIS — M25569 Pain in unspecified knee: Secondary | ICD-10-CM

## 2012-08-08 DIAGNOSIS — M25561 Pain in right knee: Secondary | ICD-10-CM | POA: Insufficient documentation

## 2012-08-08 LAB — BASIC METABOLIC PANEL
CO2: 28 mEq/L (ref 19–32)
Chloride: 105 mEq/L (ref 96–112)
Creat: 0.75 mg/dL (ref 0.50–1.10)
Sodium: 141 mEq/L (ref 135–145)

## 2012-08-08 MED ORDER — CELECOXIB 200 MG PO CAPS: 200.0000 mg | ORAL_CAPSULE | Freq: Every day | ORAL | Status: DC | PRN

## 2012-08-08 MED ORDER — LISINOPRIL 10 MG PO TABS: 10.0000 mg | ORAL_TABLET | Freq: Every day | ORAL | Status: DC

## 2012-08-08 NOTE — Patient Instructions (Addendum)
If you have arthritis pains, can try tylenol up to 650 mg four times a day.  Try working out in a pool, or on a bike to take load off your knees. Exercise at least 15-30 minutes daily. Use celebrex when you need to, not every day.  Your blood pressure is good.  Make an appointment in 6 months.

## 2012-08-08 NOTE — Assessment & Plan Note (Signed)
>>  ASSESSMENT AND PLAN FOR RIGHT KNEE PAIN WRITTEN ON 08/08/2012  9:20 AM BY KONKOL, JILL N, MD  This is a chronic intermittent problem. No effusion or signs of inflammatory condition on exam today. We discussed coping strategies for likely osteoarthritis. She can use Tylenol  for daily pain. Reserve Celebrex  for as needed use to avoid renal issues. Increase activity with less weight bearing including pool work or recumbent bicycle. F/u if worsens.

## 2012-08-08 NOTE — Assessment & Plan Note (Signed)
A1c remains well below goal at 6.0. Discussed with patient that if she does notice hypoglycemia episodes, then she can decrease her Lantus dose further, by 2 units 13 units. Continue her statin and hypertension treatment. She refuses nutrition counseling. She can call for problems in the meantime. Followup 6 months.

## 2012-08-08 NOTE — Assessment & Plan Note (Addendum)
This is a chronic intermittent problem. No effusion or signs of inflammatory condition on exam today. We discussed coping strategies for likely osteoarthritis. She can use Tylenol for daily pain. Reserve Celebrex for as needed use to avoid renal issues. Increase activity with less weight bearing including pool work or recumbent bicycle. F/u if worsens.

## 2012-08-08 NOTE — Progress Notes (Signed)
  Subjective:    Patient ID: Linda Arnold, female    DOB: [birth year], 62 y.o.   MRN: [id number]  HPI  1. DM2. Patient reports her typical blood sugars each bedtime range 100 to 160s. One night this week, she had a 90 and 8 a snack. Other than that there have been no hypoglycemic episodes. She is struggling with her increased appetite. States she lost 8 pounds, then regained it. She has arguments nutrition counseling and refuses this today. She is compliant with Lantus 15 units which is a reduced dose from last year. No polyuria, polydipsia, unhealing wounds, foot numbness tingling or pain.  2. HTN. Reports values at home range systolic 110 to 115. She does not have any episodes of lightheadedness, syncope, or feeling blood pressure is too low. She is compliant with her medications. She needs a refill today  3. Right knee pain. Reports intermittent right knee pain that keeps her from walking long distances. She feels pain in the medial posterior aspect. There may been mild swelling associated, but no warmth or redness. No significant effusion. She takes Celebrex for chronic low back pain and knee pain that does help. States she was diagnosed previously with arthritis  Review of Systems See HPI otherwise negative.  reports that she has never smoked. She does not have any smokeless tobacco history on file.     Objective:   Physical Exam  Vitals reviewed. Constitutional: She appears well-developed and well-nourished. No distress.  Cardiovascular: Normal rate, regular rhythm and normal heart sounds.   Pulmonary/Chest: Effort normal and breath sounds normal. No respiratory distress. She has no wheezes. She has no rales.  Musculoskeletal:  Right knee exhibits mild crepitus and slight decrease in extension. There is no definite effusion, warmth, or erythema. She has no tenderness to palpation today.  Skin: No rash noted. She is not diaphoretic.  Psychiatric: She has a normal mood and affect.           Assessment & Plan:

## 2012-08-08 NOTE — Assessment & Plan Note (Signed)
At goal. She does not report any symptoms of over treatment, may consider decreasing her doses if necessary in the future. Check bmet. Refill printed for health department. Followup 6 months

## 2012-08-09 ENCOUNTER — Encounter: Payer: Self-pay | Admitting: Family Medicine

## 2012-09-04 ENCOUNTER — Ambulatory Visit (HOSPITAL_COMMUNITY)
Admission: RE | Admit: 2012-09-04 | Discharge: 2012-09-04 | Disposition: A | Discharge status: Home / Self Care | Payer: PRIVATE HEALTH INSURANCE | Source: Ambulatory Visit | Attending: Family Medicine | Admitting: Family Medicine

## 2012-09-04 DIAGNOSIS — Z1231 Encounter for screening mammogram for malignant neoplasm of breast: Secondary | ICD-10-CM | POA: Insufficient documentation

## 2012-09-13 ENCOUNTER — Other Ambulatory Visit: Payer: Self-pay

## 2012-10-01 ENCOUNTER — Encounter: Payer: Self-pay | Admitting: Family Medicine

## 2012-10-01 ENCOUNTER — Ambulatory Visit (INDEPENDENT_AMBULATORY_CARE_PROVIDER_SITE_OTHER): Payer: PRIVATE HEALTH INSURANCE | Admitting: Family Medicine

## 2012-10-01 VITALS — BP 123/72 | HR 91 | Temp 99.6°F | Ht 63.0 in | Wt 227.0 lb

## 2012-10-01 DIAGNOSIS — M25569 Pain in unspecified knee: Secondary | ICD-10-CM

## 2012-10-01 DIAGNOSIS — M25561 Pain in right knee: Secondary | ICD-10-CM

## 2012-10-01 NOTE — Assessment & Plan Note (Signed)
>>  ASSESSMENT AND PLAN FOR RIGHT KNEE PAIN WRITTEN ON 10/01/2012 11:38 PM BY PILOTO DE LA PAZ, DAYARMYS, MD  Evaluated last month for same condition. Phsysical exam unchanged. Not much improvement with Tylenol  and Celebrex . P/ Discussion about condition, management and expectations for full recovery. Discussed escalating therapy with tramadol but pt declined. Other options discussed as knee steroid injection. Pt also decline this procedure. She stated her pain is not as bad.

## 2012-10-01 NOTE — Assessment & Plan Note (Signed)
Evaluated last month for same condition. Phsysical exam unchanged. Not much improvement with Tylenol and Celebrex. P/ Discussion about condition, management and expectations for full recovery. Discussed escalating therapy with tramadol but pt declined. Other options discussed as knee steroid injection. Pt also decline this procedure. She stated her pain is not as bad.

## 2012-10-01 NOTE — Patient Instructions (Signed)
Wear and Tear Disorders of the Knee (Arthritis, Osteoarthritis)  Everyone will experience wear and tear injuries (arthritis, osteoarthritis) of the knee. These are the changes we all get as we age. They come from the joint stress of daily living. The amount of cartilage damage in your knee and your symptoms determine if you need surgery. Mild problems require approximately two months recovery time. More severe problems take several months to recover. With mild problems, your surgeon may find worn and rough cartilage surfaces. With severe changes, your surgeon may find cartilage that has completely worn away and exposed the bone. Loose bodies of bone and cartilage, bone spurs (excess bone growth), and injuries to the menisci (cushions between the large bones of your leg) are also common. All of these problems can cause pain.  For a mild wear and tear problem, rough cartilage may simply need to be shaved and smoothed. For more severe problems with areas of exposed bone, your surgeon may use an instrument for roughing up the bone surfaces to stimulate new cartilage growth. Loose bodies are usually removed. Torn menisci may be trimmed or repaired.  ABOUT THE ARTHROSCOPIC PROCEDURE  Arthroscopy is a surgical technique. It allows your orthopedic surgeon to diagnose and treat your knee injury with accuracy. The surgeon looks into your knee through a small scope. The scope is like a small (pencil-sized) telescope. Arthroscopy is less invasive than open knee surgery. You can expect a more rapid recovery. After the procedure, you will be moved to a recovery area until most of the effects of the medication have worn off. Your caregiver will discuss the test results with you.  RECOVERY  The severity of the arthritis and the type of procedure performed will determine recovery time. Other important factors include age, physical condition, medical conditions, and the type of rehabilitation program. Strengthening your muscles after  arthroscopy helps guarantee a better recovery. Follow your caregiver's instructions. Use crutches, rest, elevate, ice, and do knee exercises as instructed. Your caregivers will help you and instruct you with exercises and other physical therapy required to regain your mobility, muscle strength, and functioning following surgery. Only take over-the-counter or prescription medicines for pain, discomfort, or fever as directed by your caregiver.   SEEK MEDICAL CARE IF:   · There is increased bleeding (more than a small spot) from the wound.  · You notice redness, swelling, or increasing pain in the wound.  · Pus is coming from wound.  · You develop an unexplained oral temperature above 102° F (38.9° C) , or as your caregiver suggests.  · You notice a foul smell coming from the wound or dressing.  · You have severe pain with motion of the knee.  SEEK IMMEDIATE MEDICAL CARE IF:   · You develop a rash.  · You have difficulty breathing.  · You have any allergic problems.  MAKE SURE YOU:   · Understand these instructions.  · Will watch your condition.  · Will get help right away if you are not doing well or get worse.  Document Released: 02/19/2000 Document Revised: 05/16/2011 Document Reviewed: 07/18/2007  [employer]® Patient Information ©2014 [employer].

## 2012-10-01 NOTE — Progress Notes (Signed)
Family Medicine Office Visit Note   Subjective:   Patient ID: Linda Arnold, female  DOB: [birth year], 62 y.o.. MRN: [id number]   Pt that comes today for same day appointment complaining of right knee pain since 1st of May. Have taken tylenol, celebex with no improvement.  The pain is located on R knee  with no radiation  It is described as intermittent and dull with max Intensity of 8/10, but does not wake her up from sleep or limits her function.  The associated symptoms are stiffness when not moving for long periods of time(seatting, laying down)      No other joint involved. No erythema or edema reported. Denies hx of trauma to that knee.  Review of Systems:  Per HPI  Objective:   Physical Exam: Gen:  Obese pt. NAD HEENT: Moist mucous membranes  CV: Regular rate and rhythm, no murmurs rubs or gallops PULM: Clear to auscultation bilaterally. No wheezes/rales/rhonchi MSK: Right knee with no exudate, calor or erythema. Tender to palpation of lateral aspect of joint line. No joint laxity. Normal ROM. Normal pulses.  Neuro: Alert and oriented x3. No focalization  Assessment & Plan:

## 2012-10-25 ENCOUNTER — Ambulatory Visit: Payer: PRIVATE HEALTH INSURANCE

## 2012-10-29 ENCOUNTER — Encounter: Payer: Self-pay | Admitting: Family Medicine

## 2012-10-29 ENCOUNTER — Ambulatory Visit (INDEPENDENT_AMBULATORY_CARE_PROVIDER_SITE_OTHER): Payer: PRIVATE HEALTH INSURANCE | Admitting: Family Medicine

## 2012-10-29 VITALS — BP 123/73 | HR 77 | Temp 98.4°F | Ht 63.0 in | Wt 224.5 lb

## 2012-10-29 DIAGNOSIS — M1710 Unilateral primary osteoarthritis, unspecified knee: Secondary | ICD-10-CM

## 2012-10-29 DIAGNOSIS — E785 Hyperlipidemia, unspecified: Secondary | ICD-10-CM

## 2012-10-29 DIAGNOSIS — E119 Type 2 diabetes mellitus without complications: Secondary | ICD-10-CM

## 2012-10-29 DIAGNOSIS — IMO0002 Reserved for concepts with insufficient information to code with codable children: Secondary | ICD-10-CM

## 2012-10-29 DIAGNOSIS — M1711 Unilateral primary osteoarthritis, right knee: Secondary | ICD-10-CM | POA: Insufficient documentation

## 2012-10-29 DIAGNOSIS — E7800 Pure hypercholesterolemia, unspecified: Secondary | ICD-10-CM

## 2012-10-29 LAB — LDL CHOLESTEROL, DIRECT: Direct LDL: 112 mg/dL — ABNORMAL HIGH

## 2012-10-29 MED ORDER — MELOXICAM 7.5 MG PO TABS: 7.5000 mg | ORAL_TABLET | Freq: Every day | ORAL | Status: DC

## 2012-10-29 MED ORDER — INSULIN GLARGINE 100 UNIT/ML ~~LOC~~ SOLN: 10.0000 [IU] | Freq: Every day | SUBCUTANEOUS | Status: DC

## 2012-10-29 NOTE — Patient Instructions (Signed)
It was very nice to meet you. Your diabetes is doing very well, and you are taking very good care of yourself. Keep trying to eat well and losing weight. Adding exercise will help your diabetes as well as your knee pain, so try to do as much walking as possible. You are being prescribe meloxicam (mobic) to take every day that should help ease your pain. We will discuss physical therapy at your next visit.   Let's have another office visit in 3 months Lower night time lantus to 10 units Keep checking sugars twice a day Start taking mobic once a day for knee pain Start walking more and keep eating well   Thank you!

## 2012-10-29 NOTE — Assessment & Plan Note (Signed)
History of diagnosis suggests possible misdiagnosis of steroid-induced hyperglycemia during a hospitalization in 2005. Has no documented abnormal A1c. A1c (5.9) remains well below goal on 15 units of lantus, with no hypoglycemic episodes reported. We will decrease lantus to 10 units at bedtime and continue monitoring twice daily. We will follow up in 3 months with another A1c and titrate from there.   Plan for hypoglycemic readings was reviewed. Continuing ACE inhibitor (HTN at goal), aspirin, statin (prev. LDL at goal), and healthy eating habits. Encourage walking. Hopefully continue losing weight.

## 2012-10-29 NOTE — Assessment & Plan Note (Signed)
No improvement with celebrex, minimal help with tylenol. Creatinine is good and stable. Will prescribe meloxicam 7.5mg  daily with tylenol for breakthrough pain, encouraged walking and weight loss. Pt would like not to be on any opioid at all, and refuses joint injections. We spoke about physical therapy which would be covered with the orange card, but she would rather just do exercises on her own. We will revisit this option on 3 month follow up.

## 2012-10-29 NOTE — Progress Notes (Signed)
  Subjective:    Patient ID: Linda Arnold, female    DOB: 01/23/1951, 62 y.o.   MRN: [id number]  Diabetes Pertinent negatives for hypoglycemia include no dizziness. Pertinent negatives for diabetes include no chest pain.  Knee Pain    Linda Arnold is a 62 year old female here for diabetes follow-up and right knee pain  Diabetes mellitus: Diagnosed in 2005 during a hospitalization during which she was on steroids. Has been on oral medications in the past, but currently only takes 15 units of lantus at bedtime. Checks sugars twice a day, AM readings 100-110s, postprandial readings <160s. Last A1c 6.0, A1c today is 5.9. Denies hypoglycemic episodes, dizziness, sweating, nausea, syncope. Takes ASA, pravastatin, lisinopril daily. Retinal exam scheduled December, regular dental exams, no podiatrist and no history of foot wounds. Denies numbness/tingling in feet/legs.   Right knee pain: Chronic complaint, treated previously with celebrex and tylenol. Pain is severe, "aching," present at rest, lateral > medial, exercise-limiting, and not helped by celebrex, barely helped by tylenol. Has been putting anti-inflammatory cream on it with some improvement. No L sided complaint. Never had an X ray.   Review of Systems  Constitutional: Negative for fever, chills and diaphoresis.  Respiratory: Negative for shortness of breath.   Cardiovascular: Negative for chest pain.  Gastrointestinal: Negative for abdominal pain.  Genitourinary: Negative for vaginal bleeding and pelvic pain.  Neurological: Negative for dizziness, syncope and light-headedness.       Objective:   Physical Exam  Constitutional: She is oriented to person, place, and time. She appears well-developed and well-nourished.  Obese  HENT:  Head: Normocephalic.  Mouth/Throat: Oropharynx is clear and moist.  Eyes: Conjunctivae are normal. Pupils are equal, round, and reactive to light.  Right eye lateral disconjugate gaze.   Neck: Neck  supple. No thyromegaly present.  Cardiovascular: Normal rate, regular rhythm and intact distal pulses.   No murmur heard. Pulmonary/Chest: Effort normal and breath sounds normal. No respiratory distress.  Abdominal: Soft. She exhibits no distension. There is no tenderness.  Musculoskeletal:  R knee with crepitus, full ROM, lateral joint line pain without effusion or erythema. Negative anterior drawer. L knee also with some crepitus, full ROM, no tenderness.   Lymphadenopathy:    She has no cervical adenopathy.  Neurological: She is alert and oriented to person, place, and time. She has normal reflexes.  Skin: Skin is warm and dry.  Foot: monofilament testing shows intact sensation on great toe, 5th toe, arch bilaterally. 2+ DP pulses. Cap refill <2sec. No wounds.      Assessment & Plan:  Linda Arnold is a 62 year-old here for diabetes and right knee pain follow up.

## 2012-10-29 NOTE — Assessment & Plan Note (Signed)
Continue pravastatin 20mg . Check direct LDL today. Most recent lipid profile was 2011 where LDL was 85 and HDL was 55. If not at goal, will consider increasing intensity of therapy.

## 2012-11-03 ENCOUNTER — Telehealth: Payer: Self-pay | Admitting: Family Medicine

## 2012-11-03 NOTE — Telephone Encounter (Signed)
[facility] After Hours Line.   Blood drawn Monday on clinic. Started mobic on Tuesday for OA. Noted rash around site of blood draw Tuesday or Wednesday. Concerned could eb from blood draw or medicine. Does appear to be bruised but also red and itchy. Advised patient to stop meloxicam and can take benadryl for itching. Advised patient to come to clinic on Tuesday for reevaluation.   Aldine Contes. Marti Sleigh, MD, PGY3 11/03/2012 12:48 PM

## 2012-11-05 ENCOUNTER — Encounter (HOSPITAL_COMMUNITY): Payer: Self-pay | Admitting: Emergency Medicine

## 2012-11-05 ENCOUNTER — Emergency Department (INDEPENDENT_AMBULATORY_CARE_PROVIDER_SITE_OTHER)
Admission: EM | Admit: 2012-11-05 | Discharge: 2012-11-05 | Disposition: A | Discharge status: Home / Self Care | Payer: PRIVATE HEALTH INSURANCE | Source: Home / Self Care | Attending: Emergency Medicine | Admitting: Emergency Medicine

## 2012-11-05 DIAGNOSIS — L259 Unspecified contact dermatitis, unspecified cause: Secondary | ICD-10-CM

## 2012-11-05 MED ORDER — METHYLPREDNISOLONE ACETATE 80 MG/ML IJ SUSP
80.0000 mg | Freq: Once | INTRAMUSCULAR | Status: AC
Start: 2012-11-05 — End: 2012-11-05
  Administered 2012-11-05: 80 mg via INTRAMUSCULAR

## 2012-11-05 MED ORDER — METHYLPREDNISOLONE ACETATE 80 MG/ML IJ SUSP
INTRAMUSCULAR | Status: AC
  Filled 2012-11-05: qty 1

## 2012-11-05 MED ORDER — HYDROXYZINE HCL 25 MG PO TABS: 25.0000 mg | ORAL_TABLET | Freq: Four times a day (QID) | ORAL | Status: DC | PRN

## 2012-11-05 MED ORDER — TRIAMCINOLONE ACETONIDE 0.1 % EX CREA: TOPICAL_CREAM | Freq: Three times a day (TID) | CUTANEOUS | Status: DC

## 2012-11-05 NOTE — ED Notes (Signed)
C/o rash of the right antecubital space since Tuesday.  Rash is red/itchy with mild swelling. Denies fever and drainage. Has used benadryl with some relief in itch.

## 2012-11-05 NOTE — ED Provider Notes (Signed)
Chief Complaint:   Chief Complaint  Patient presents with  . Rash    right antecubital since tuesday,.    History of Present Illness:   Linda Arnold is a 62 year old female who went to see her primary care physician this past Monday, week ago. She had some blood drawn from her right antecubital fossa. The next day she broke out in a pruritic rash in her right antecubital fossa which has gotten worse since then. She has a couple of bumps on her left shoulder as well. She denies any difficulty breathing or swelling of her lips, throat, her tongue.  Review of Systems:  Other than noted above, the patient denies any of the following symptoms: Systemic:  No fever, chills, sweats, weight loss, or fatigue. ENT:  No nasal congestion, rhinorrhea, sore throat, swelling of lips, tongue or throat. Resp:  No cough, wheezing, or shortness of breath. Skin:  No rash, itching, nodules, or suspicious lesions.  PMFSH:  Past medical history, family history, social history, meds, and allergies were reviewed. Has a history of arthritis, diabetes, hyperlipidemia, and hypertension. She takes aspirin, Lantus, lisinopril, pravastatin.  Physical Exam:   Vital signs:  BP 112/70  Pulse 80  Temp(Src) 97.3 F (36.3 C) (Oral)  Resp 16  SpO2 97% Gen:  Alert, oriented, in no distress. ENT:  Pharynx clear, no intraoral lesions, moist mucous membranes. Lungs:  Clear to auscultation. Skin:  There was an erythematous maculopapular to vesicular rash in the right antecubital fossa in a very localized area. She has 2 bumps on her left shoulder as well. Skin was otherwise clear.  Course in Urgent Care Center:   Given Depo-Medrol 80 mg IM.  Assessment:  The encounter diagnosis was Contact dermatitis.  I think this is probably a contact dermatitis due to the anesthetic use when her blood was drawn, probably Betadine. I do not think it was due to the meloxicam and I think she can continue on with this. I told her to avoid  Betadine or any iodine containing products in the future.  Plan:   1.  The following meds were prescribed:   Discharge Medication List as of 11/05/2012  3:57 PM    START taking these medications   Details  hydrOXYzine (ATARAX/VISTARIL) 25 MG tablet Take 1 tablet (25 mg total) by mouth every 6 (six) hours as needed for itching., Starting 11/05/2012, Until Discontinued, Normal    triamcinolone cream (KENALOG) 0.1 % Apply topically 3 (three) times daily., Starting 11/05/2012, Until Discontinued, Normal       2.  The patient was instructed in symptomatic care and handouts were given. 3.  The patient was told to return if becoming worse in any way, if no better in 3 or 4 days, and given some red flag symptoms such as difficulty breathing or spreading of the rash that would indicate earlier return. 4.  Follow up here or with her primary care physician as needed.     Reuben Likes, MD 11/05/12 [id number]

## 2012-11-06 ENCOUNTER — Telehealth: Payer: Self-pay | Admitting: *Deleted

## 2012-11-06 NOTE — Telephone Encounter (Signed)
Pt reports coming in on Monday and started breaking out in a rash - she believed to be meloxicam - stopped taking it - did not get any better - went to urgent care was told that it was a reaction to betadine ( given shot, cream and pills) - reports now that it is no better Linda Arnold - instructed to continue taking meds as directed if no better by Thursday morning call for appointment - if symptoms become worse then to call clinic immediately - pt verbalized understanding. Wyatt Haste, RN-BSN

## 2012-11-14 ENCOUNTER — Telehealth: Payer: Self-pay | Admitting: *Deleted

## 2012-11-14 ENCOUNTER — Ambulatory Visit (INDEPENDENT_AMBULATORY_CARE_PROVIDER_SITE_OTHER): Payer: PRIVATE HEALTH INSURANCE | Admitting: Family Medicine

## 2012-11-14 VITALS — BP 109/79 | HR 79 | Temp 98.1°F | Ht 63.0 in | Wt 221.0 lb

## 2012-11-14 DIAGNOSIS — L259 Unspecified contact dermatitis, unspecified cause: Secondary | ICD-10-CM

## 2012-11-14 MED ORDER — MOMETASONE FUROATE 0.1 % EX SOLN: Freq: Every day | CUTANEOUS | Status: DC

## 2012-11-14 MED ORDER — PREDNISONE 20 MG PO TABS: 20.0000 mg | ORAL_TABLET | Freq: Every day | ORAL | Status: DC

## 2012-11-14 NOTE — Patient Instructions (Addendum)
You have a generalized allergic reaction. Please take medication as prescribed. Prednisone take 2 tablets daily for 3 days then 1 tablet daily for 3 days and half a tablet daily for 3 days and then stop. Given a use mometasone solution on the affected areas are no open.

## 2012-11-14 NOTE — Telephone Encounter (Signed)
Patient needs a something cheaper than the elocan lotion sent into her pharmacy, will froward to Dr Aviva Signs to change Rx.Busick, Rodena Medin

## 2012-11-14 NOTE — Progress Notes (Signed)
Family Medicine Office Visit Note   Subjective:   Patient ID: Linda Arnold, female  DOB: 1950-07-01, 62 y.o.. MRN: [id number]   Pt that comes today for same day appointment complaining of R upper extremity lesion. Pt reports noticed redness on her R antecubital area the same day she had labs drawn. After that she developed swelling, itchiness and more redness of the area. She came for evaluation and topical triamcinolone as well as antihistaminic was prescribed. Lesion has not resolved and nw she reports had symptoms extended to her right side of her trunk. We discussed with lab to identify possible causal agent and they only use isopropyl alcohol to clean area before flevotomy and covered with regular band aid afterwards.   Review of Systems:  Per HPI  Objective:   Physical Exam: Gen:  NAD Skin: right antecubital area with erythematous papulo-macular lesion about 5 cm diameter with 3 superficial lacerations (from scratching) in the process of healing. No drainage.  maculopapular rash also observed on R aspect of trunk. No other areas or mucosal involvement.   Assessment & Plan:

## 2012-11-16 DIAGNOSIS — L259 Unspecified contact dermatitis, unspecified cause: Secondary | ICD-10-CM | POA: Insufficient documentation

## 2012-11-16 NOTE — Assessment & Plan Note (Signed)
Contact dermatitis clinically with spread that can be secondary to generalized allergic reaction. P/ Oral Prednisone Topical mometasone F/u as needed. Instructed to avoid latex or tape since alcohol is less likely to be the cause.

## 2012-11-16 NOTE — Telephone Encounter (Signed)
Left message for patient to return call, please read message from Dr Aviva Signs.Linda Arnold, Linda Arnold

## 2012-11-16 NOTE — Telephone Encounter (Signed)
She can continue using Triamcinolone cream instead.

## 2012-11-21 NOTE — Telephone Encounter (Signed)
Pt was already informed

## 2012-11-22 ENCOUNTER — Ambulatory Visit (INDEPENDENT_AMBULATORY_CARE_PROVIDER_SITE_OTHER): Payer: PRIVATE HEALTH INSURANCE | Admitting: Family Medicine

## 2012-11-22 ENCOUNTER — Encounter: Payer: Self-pay | Admitting: Family Medicine

## 2012-11-22 VITALS — BP 138/93 | HR 91 | Ht 63.0 in | Wt 222.0 lb

## 2012-11-22 DIAGNOSIS — Z23 Encounter for immunization: Secondary | ICD-10-CM

## 2012-11-22 DIAGNOSIS — L259 Unspecified contact dermatitis, unspecified cause: Secondary | ICD-10-CM

## 2012-11-22 NOTE — Assessment & Plan Note (Addendum)
Sensitivity to tape/rubbing alcohol/band-aid possible. Improving s/p oral prednisone, continue mometasone solution and triamcinolone cream for no more than 2 weeks to minimize dermal atrophy and hypopigmentation. RTC if worsens/develops S/Sx of infection as bacterial infection is the most worrisome differential, especially given the history of previous blood draws from the same lab without reactions.

## 2012-11-22 NOTE — Progress Notes (Signed)
  Subjective:     Linda Arnold is a 62 y.o. female who complains of a rash in the right antecubital fossa. Symptoms began after a blood draw on 8/25. Patient describes the rash as red, excoriated, localized. Has received topical triamcinolone and a steroid injection a couple days after it began which only slightly helped and is currently finishing a course of oral prednisone. The rash was pruritic and burning, but has since become asymptomatic. It had also spread to lateral right breast, but that has since subsided as well. She has no history of eczema, psoriasis, or allergic reactions to any component of previous blood draws including latex. She denies fevers, chills, nausea, vomiting, diarrhea.   The following portions of the patient's history were reviewed and updated as appropriate: allergies, current medications, past family history, past medical history, past social history, past surgical history and problem list.   Review of Systems Pertinent items are noted in HPI. All other systems reviewed and are negative.    Objective:    BP 138/93  Pulse 91  Ht 5\' 3"  (1.6 m)  Wt 222 lb (100.699 kg)  BMI 39.34 kg/m2 Physical Exam  General:  alert and no distress  HEENT:  sclera clear, anicteric and oropharynx clear, no lesions  Lymph Nodes:  Cervical, supraclavicular, and axillary nodes normal.  Lungs:  clear to auscultation bilaterally  Heart:  regular rate and rhythm, S1, S2 normal, no murmur, click, rub or gallop  Extremities:  extremities normal, atraumatic, no cyanosis or edema  Skin:   Rash located on the right antecubital fossa.   Shape:   Irregular ovoid   Consistency:   nontender  Color:   Central erythematous papules (about 5), hyperpigmented  Type: Papules without vesicles or warmth  Size: Hyperpigmented area: 6 in x 4 in    The remainder of the skin exam:  color normal, no rashes or suspicious lesions    Assessment:   Linda Arnold is a 62 year old female with contact  dermatitis to a component involved in a blood draw. Isopropyl alcohol was used without betadine with non-latex PPE.    Plan:    1.  topical steroid: triamcinolone, mometasone 2.  Verbal patient instruction given. 3. Follow up as previously scheduled. Discussed RTC criteria.

## 2012-11-22 NOTE — Patient Instructions (Addendum)
   It was good to see you today. Your rash seems to be getting better. Stop the oral steroid today and continue applying the 2 topical steroids to the affected area only, for no longer than 2 more weeks. Give [place] a call immediately if it gets worse or you begin experiencing fever, chills, nausea, diarrhea.  Follow up with me at your regularly scheduled appointment or sooner if needed.    SEEK MEDICAL CARE IF:   Your condition is not better after 3 days of treatment.  You seem to be getting worse.  You see signs of infection such as swelling, tenderness, redness, soreness, or warmth in the affected area.  You have any problems related to your medicines.

## 2012-11-22 NOTE — Addendum Note (Signed)
Addended by: Burna Mortimer E on: 11/22/2012 12:17 PM   Modules accepted: Orders

## 2013-01-28 ENCOUNTER — Other Ambulatory Visit: Payer: Self-pay | Admitting: Family Medicine

## 2013-01-28 DIAGNOSIS — E119 Type 2 diabetes mellitus without complications: Secondary | ICD-10-CM

## 2013-01-28 MED ORDER — INSULIN GLARGINE 100 UNIT/ML ~~LOC~~ SOLN: 10.0000 [IU] | Freq: Every day | SUBCUTANEOUS | Status: DC

## 2013-02-05 ENCOUNTER — Telehealth: Payer: Self-pay | Admitting: Family Medicine

## 2013-02-05 DIAGNOSIS — E119 Type 2 diabetes mellitus without complications: Secondary | ICD-10-CM

## 2013-02-05 MED ORDER — LISINOPRIL 10 MG PO TABS: 10.0000 mg | ORAL_TABLET | Freq: Every day | ORAL | Status: DC

## 2013-02-05 MED ORDER — PRAVASTATIN SODIUM 20 MG PO TABS: 20.0000 mg | ORAL_TABLET | Freq: Every day | ORAL | Status: DC

## 2013-02-05 NOTE — Telephone Encounter (Signed)
Refill request for lisinopril and pravastatin.

## 2013-02-08 ENCOUNTER — Encounter: Payer: Self-pay | Admitting: Family Medicine

## 2013-02-08 ENCOUNTER — Ambulatory Visit (INDEPENDENT_AMBULATORY_CARE_PROVIDER_SITE_OTHER): Payer: PRIVATE HEALTH INSURANCE | Admitting: Family Medicine

## 2013-02-08 VITALS — BP 151/89 | HR 79 | Ht 63.0 in | Wt 217.0 lb

## 2013-02-08 DIAGNOSIS — E119 Type 2 diabetes mellitus without complications: Secondary | ICD-10-CM

## 2013-02-08 DIAGNOSIS — E7800 Pure hypercholesterolemia, unspecified: Secondary | ICD-10-CM

## 2013-02-08 DIAGNOSIS — I10 Essential (primary) hypertension: Secondary | ICD-10-CM

## 2013-02-08 DIAGNOSIS — IMO0002 Reserved for concepts with insufficient information to code with codable children: Secondary | ICD-10-CM

## 2013-02-08 DIAGNOSIS — M1711 Unilateral primary osteoarthritis, right knee: Secondary | ICD-10-CM

## 2013-02-08 DIAGNOSIS — M1710 Unilateral primary osteoarthritis, unspecified knee: Secondary | ICD-10-CM

## 2013-02-08 DIAGNOSIS — Z1211 Encounter for screening for malignant neoplasm of colon: Secondary | ICD-10-CM

## 2013-02-08 MED ORDER — MELOXICAM 7.5 MG PO TABS: 7.5000 mg | ORAL_TABLET | Freq: Every day | ORAL | Status: DC

## 2013-02-08 MED ORDER — INSULIN GLARGINE 100 UNIT/ML ~~LOC~~ SOLN: 15.0000 [IU] | Freq: Every day | SUBCUTANEOUS | Status: DC

## 2013-02-08 NOTE — Assessment & Plan Note (Signed)
Future order placed for fasting lipid profile. Continue pravastatin for now, will likely increase dose, as only prava/simva covered under [facility].

## 2013-02-08 NOTE — Assessment & Plan Note (Addendum)
Continue 15u lantus qHS. Recheck Hb A1c at next lab visit, as prednisone has been discontinued for only 2 months. No hypoglycemia.

## 2013-02-08 NOTE — Assessment & Plan Note (Signed)
Continue mobic and tylenol prn. Continues to decline PT.

## 2013-02-08 NOTE — Assessment & Plan Note (Addendum)
151/89 today after not taking lisinopril for 1 week. Checking BP at home, and those were at goal. No change in therapy at this time. Lisinopril refilled. Check BMP

## 2013-02-08 NOTE — Progress Notes (Signed)
Patient ID: Linda Arnold, female   DOB: October 09, 1950, 62 y.o.   MRN: [id number] Subjective:  HPI:   Linda Arnold is a 62 y.o. female with h/o T2DM, HTN, hypercholesterolemia, and osteoarthritis here for follow up.   She reportsrare episodes of her right knee "catching" with flashes of sharp pain without preceding symptoms or pain after a moment of waiting. No falls. Aching pain is worse at the end of the day, improved with mobic and tylenol. It is intermittent and no better or worse than last visit. She is getting minimal exercise by walking. No other joint pains.   Fasting blood sugars range 100-115 on 15 units lantus qHS. No hypoglycemic symptoms. Not on prednisone for past 2 months.   Denies fever, chills, HA, vision changes, cough, CP, COB, palpitations, dizziness, N/V/D/C, has about 1 BM every 2-3 days at baseline. No change in bowel habits or stool shape/caliber. No blood in stool. No dysuria/urinary frequency.    Of note, she has been out of BP medications for 7 days. She checks BP every day and normally gets SBP's 115-130.   Saw ophthalmologist 3 weeks ago. No retinopathy. She does have bilateral cataracts R > L, but no treatment was suggested at this time.   Review of Systems:  Per HPI. All other systems reviewed and are negative.    Objective:  Physical Exam: BP 151/89  Pulse 79  Ht 5\' 3"  (1.6 m)  Wt 217 lb (98.431 kg)  BMI 38.45 kg/m2  Gen: 61 y.o. female in NAD HEENT: MMM, EOMI, PERRL, + cataracts R > L without appreciable loss of acuity CV: RRR, no MRG Resp: Non-labored, CTAB, no wheezes noted Abd: Soft, NTND, BS present, no guarding or organomegaly MSK: No edema noted, full ROM. R knee with crepitus, no deformity Neuro: Alert and oriented, speech normal, patellar DTRs 2+    Assessment:     Linda Arnold is a 62 y.o. female with h/o T2DM, HTN, hypercholesterolemia, and osteoarthritis here for follow up.   Plan:     See problem list for problem-specific  plans.

## 2013-02-08 NOTE — Patient Instructions (Signed)
Thank you for coming in today!  [facility] because this seems to help with your right knee pain. You can also take tylenol for pain.   Your diabetes sounds like it's under good control. Continue 15 units at bedtime and continue checking your blood sugars. If you have consistent readings above 140 or below 70, schedule an appointment to modify your regimen.   Come in at your convenience to get some lab work done while fasting. You can come in in the morning after not eating since midnight.  You can bring back your hemoccult cards at that time as well.   Continue taking pravachol 20mg  and lipitor 10mg  for cholesterol and blood pressure.   As you leave, make an appointment to follow up with me in 6 months.  - Bring a log of your fasting morning blood sugars.  Take care and seek immediate care sooner than 6 months if you develop any concerns.  Please feel free to call with any questions or concerns at any time, at [phone]. - Dr. Jarvis Newcomer

## 2013-02-18 ENCOUNTER — Other Ambulatory Visit (INDEPENDENT_AMBULATORY_CARE_PROVIDER_SITE_OTHER): Payer: PRIVATE HEALTH INSURANCE

## 2013-02-18 DIAGNOSIS — E119 Type 2 diabetes mellitus without complications: Secondary | ICD-10-CM

## 2013-02-18 DIAGNOSIS — E7800 Pure hypercholesterolemia, unspecified: Secondary | ICD-10-CM

## 2013-02-18 LAB — LIPID PANEL
HDL: 53 mg/dL (ref >39)
Total CHOL/HDL Ratio: 3.2 Ratio
VLDL: 12 mg/dL (ref 0–40)

## 2013-02-18 LAB — BASIC METABOLIC PANEL
BUN: 11 mg/dL (ref 6–23)
Calcium: 9.1 mg/dL (ref 8.4–10.5)
Glucose, Bld: 114 mg/dL — ABNORMAL HIGH (ref 70–99)
Sodium: 140 mEq/L (ref 135–145)

## 2013-02-18 LAB — POC HEMOCCULT BLD/STL (HOME/3-CARD/SCREEN)

## 2013-02-18 LAB — POCT GLYCOSYLATED HEMOGLOBIN (HGB A1C): Hemoglobin A1C: 5.8

## 2013-02-18 NOTE — Progress Notes (Signed)
BMP,FLP AND A1C DONE TODAY Arra Connaughton 

## 2013-02-18 NOTE — Addendum Note (Signed)
Addended by: [place], Nalayah Hitt on: 02/18/2013 04:59 PM   Modules accepted: Orders

## 2013-08-23 ENCOUNTER — Other Ambulatory Visit (HOSPITAL_COMMUNITY): Payer: Self-pay | Admitting: Nurse Practitioner

## 2013-12-09 ENCOUNTER — Emergency Department (HOSPITAL_COMMUNITY): Payer: Self-pay

## 2013-12-09 ENCOUNTER — Encounter (HOSPITAL_COMMUNITY): Payer: Self-pay | Admitting: Emergency Medicine

## 2013-12-09 ENCOUNTER — Emergency Department (HOSPITAL_COMMUNITY)
Admission: EM | Admit: 2013-12-09 | Discharge: 2013-12-09 | Disposition: A | Discharge status: Home / Self Care | Payer: Self-pay | Attending: Emergency Medicine | Admitting: Emergency Medicine

## 2013-12-09 ENCOUNTER — Ambulatory Visit (INDEPENDENT_AMBULATORY_CARE_PROVIDER_SITE_OTHER): Payer: Self-pay | Admitting: Family Medicine

## 2013-12-09 VITALS — BP 128/88 | HR 85 | Temp 98.6°F | Resp 18

## 2013-12-09 DIAGNOSIS — D72829 Elevated white blood cell count, unspecified: Secondary | ICD-10-CM

## 2013-12-09 DIAGNOSIS — R519 Headache, unspecified: Secondary | ICD-10-CM

## 2013-12-09 DIAGNOSIS — Z794 Long term (current) use of insulin: Secondary | ICD-10-CM | POA: Insufficient documentation

## 2013-12-09 DIAGNOSIS — I10 Essential (primary) hypertension: Secondary | ICD-10-CM | POA: Insufficient documentation

## 2013-12-09 DIAGNOSIS — E119 Type 2 diabetes mellitus without complications: Secondary | ICD-10-CM | POA: Insufficient documentation

## 2013-12-09 DIAGNOSIS — R079 Chest pain, unspecified: Secondary | ICD-10-CM

## 2013-12-09 DIAGNOSIS — Z7982 Long term (current) use of aspirin: Secondary | ICD-10-CM | POA: Insufficient documentation

## 2013-12-09 DIAGNOSIS — M542 Cervicalgia: Secondary | ICD-10-CM

## 2013-12-09 DIAGNOSIS — R51 Headache: Secondary | ICD-10-CM

## 2013-12-09 DIAGNOSIS — M1990 Unspecified osteoarthritis, unspecified site: Secondary | ICD-10-CM | POA: Insufficient documentation

## 2013-12-09 DIAGNOSIS — E785 Hyperlipidemia, unspecified: Secondary | ICD-10-CM | POA: Insufficient documentation

## 2013-12-09 DIAGNOSIS — Z79899 Other long term (current) drug therapy: Secondary | ICD-10-CM | POA: Insufficient documentation

## 2013-12-09 DIAGNOSIS — M62838 Other muscle spasm: Secondary | ICD-10-CM | POA: Insufficient documentation

## 2013-12-09 DIAGNOSIS — M436 Torticollis: Secondary | ICD-10-CM | POA: Insufficient documentation

## 2013-12-09 LAB — POCT CBC
Granulocyte percent: 74 % (ref 37–80)
HCT, POC: 42.2 % (ref 37.7–47.9)
Hemoglobin: 13.7 g/dL (ref 12.2–16.2)
Lymph, poc: 2.5 (ref 0.6–3.4)
MCH, POC: 30.1 pg (ref 27–31.2)
MCHC: 32.3 g/dL (ref 31.8–35.4)
MCV: 93.1 fL (ref 80–97)
MID (cbc): 0.8 (ref 0–0.9)
MPV: 8.3 fL (ref 0–99.8)
POC Granulocyte: 9.4 — AB (ref 2–6.9)
POC LYMPH PERCENT: 19.8 % (ref 10–50)
POC MID %: 6.2 %M (ref 0–12)
Platelet Count, POC: 185 10*3/uL (ref 142–424)
RBC: 4.54 M/uL (ref 4.04–5.48)
RDW, POC: 14.3 %
WBC: 12.7 10*3/uL — AB (ref 4.6–10.2)

## 2013-12-09 LAB — I-STAT TROPONIN, ED: TROPONIN I, POC: 0 ng/mL (ref 0.00–0.08)

## 2013-12-09 MED ORDER — HYDROCODONE-ACETAMINOPHEN 5-325 MG PO TABS: 1.0000 | ORAL_TABLET | ORAL | Status: DC | PRN

## 2013-12-09 MED ORDER — KETOROLAC TROMETHAMINE 60 MG/2ML IM SOLN
30.0000 mg | Freq: Once | INTRAMUSCULAR | Status: AC
  Administered 2013-12-09: 30 mg via INTRAMUSCULAR
  Filled 2013-12-09: qty 2

## 2013-12-09 MED ORDER — CYCLOBENZAPRINE HCL 10 MG PO TABS
5.0000 mg | ORAL_TABLET | Freq: Once | ORAL | Status: AC
  Administered 2013-12-09: 5 mg via ORAL
  Filled 2013-12-09: qty 1

## 2013-12-09 MED ORDER — CYCLOBENZAPRINE HCL 10 MG PO TABS: 10.0000 mg | ORAL_TABLET | Freq: Two times a day (BID) | ORAL | Status: DC | PRN

## 2013-12-09 MED ORDER — IBUPROFEN 600 MG PO TABS: 600.0000 mg | ORAL_TABLET | Freq: Four times a day (QID) | ORAL | Status: DC | PRN

## 2013-12-09 MED ORDER — HYDROCODONE-ACETAMINOPHEN 5-325 MG PO TABS
1.0000 | ORAL_TABLET | Freq: Once | ORAL | Status: AC
  Administered 2013-12-09: 1 via ORAL
  Filled 2013-12-09: qty 1

## 2013-12-09 NOTE — ED Notes (Signed)
Per EMS- pt woke with sharp intermittent neck pain on the left side. No known injury. Pt went to [facility]. Pt reported some chest discomfort to them. Pt received 324mg  of asprin. NAD noted. A&Ox4

## 2013-12-09 NOTE — ED Notes (Signed)
Pt placed into gown and on monitor upon arrival to room. PT monitored by blood pressure, pulse ox, and 12 lead. Pts EKG given to and signed by DR. Horton

## 2013-12-09 NOTE — Progress Notes (Signed)
Chief Complaint:  Chief Complaint  Patient presents with  . Neck Pain    L side neck x this am  . Headache  . Chest Pain    HPI: Linda Arnold is a 63 y.o. female who is here for neck pain, HA, CP  Woke up this morning with left sided neck pain,intermittent but is 10/10 pain and  has  HA  And inability to move her neck, she started having CP radiating from neck over left side in the waiting room. No nausea or vomiting. She has risk factors for CAD, DM and also Hyperlipidemia. She is on ASA 81 mg and is complaint with all her meds.  Takes all meds at night. She has had no fvers or chills or URI sxs. No prior hx of meninigitis. No rashes, She denies light or noise sensitivity. No sick contacts. She lives alone. NKI to neck. No recent travels. No prior neck pain like this .    Past Medical History  Diagnosis Date  . Arthritis 1999    knees  . Diabetes mellitus without complication 2005  . Hyperlipidemia   . Hypertension    Past Surgical History  Procedure Laterality Date  . Cesarean section    . Tubal ligation     History   Social History  . Marital Status: Widowed    Spouse Name: N/A    Number of Children: N/A  . Years of Education: N/A   Social History Main Topics  . Smoking status: Never Smoker   . Smokeless tobacco: None  . Alcohol Use: No  . Drug Use: No  . Sexual Activity: Not Currently   Other Topics Concern  . None   Social History Narrative   Widowed and unemployed. Attended some college. Walks infrequently for exercise. No tobacco or drug use. Previously went to [employer] until 2013.   Family History  Problem Relation Age of Onset  . Diabetes Mother   . Diabetes Sister   . Hyperlipidemia Sister   . Hypertension Sister   . Cancer Brother 24    Lung cancer x2 brothers  . Hypertension Brother    Allergies  Allergen Reactions  . Betadine [Povidone Iodine] Rash   Prior to Admission medications   Medication Sig Start Date End Date Taking?  Authorizing Provider  aspirin 81 MG tablet Take 81 mg by mouth daily.   Yes Historical Provider, MD  calcium citrate-vitamin D 200-200 MG-UNIT TABS Take 2 tablets by mouth daily.   Yes Historical Provider, MD  insulin glargine (LANTUS) 100 UNIT/ML injection Inject 20 Units into the skin at bedtime. 02/08/13  Yes Tyrone Nine, MD  lisinopril (PRINIVIL,ZESTRIL) 10 MG tablet Take 1 tablet (10 mg total) by mouth daily. 02/05/13  Yes Tyrone Nine, MD  pravastatin (PRAVACHOL) 20 MG tablet Take 1 tablet (20 mg total) by mouth daily. 02/05/13  Yes Tyrone Nine, MD  hydrOXYzine (ATARAX/VISTARIL) 25 MG tablet Take 1 tablet (25 mg total) by mouth every 6 (six) hours as needed for itching. 11/05/12   Reuben Likes, MD  meloxicam (MOBIC) 7.5 MG tablet Take 1 tablet (7.5 mg total) by mouth daily. 02/08/13   Tyrone Nine, MD  mometasone (ELOCON) 0.1 % lotion Apply topically daily. 11/14/12   Dayarmys [place] de Criselda Peaches, MD  triamcinolone cream (KENALOG) 0.1 % Apply topically 3 (three) times daily. 11/05/12   Reuben Likes, MD     ROS: The patient denies fevers, chills, night sweats,  unintentional weight loss, palpitations, wheezing, dyspnea on exertion, nausea, vomiting, abdominal pain, dysuria, hematuria, melena, numbness, weakness, or tingling.    All other systems have been reviewed and were otherwise negative with the exception of those mentioned in the HPI and as above.    PHYSICAL EXAM: Filed Vitals:   12/09/13 1244  BP: 128/88  Pulse: 85  Temp: 98.6 F (37 C)  Resp: 18  Spo2 95% There were no vitals filed for this visit. There is no weight on file to calculate BMI.  General: Alert, Moderate acute distress HEENT:  Normocephalic, atraumatic, oropharynx patent. She has strabismus, neg for nystagmus. PERRLA.  Neg exudates Cardiovascular:  Regular rate and rhythm, no rubs murmurs or gallops.  No Carotid bruits, radial pulse intact. No pedal edema.  Respiratory: Clear to auscultation bilaterally.  Minimal  basilar wheeze, No rales, or rhonchi.  No cyanosis, no use of accessory musculature GI: No organomegaly, abdomen is soft and non-tender, positive bowel sounds.  No masses. Skin: No rashes. Neurologic: Facial musculature symmetric. She has no slurred speech.  Psychiatric: Patient is appropriate throughout our interaction. Lymphatic: No cervical lymphadenopathy Musculoskeletal: Gait intact. ROM-decrease ROM in flexion, ext and rottion  nuchal rigidity. Unable to bring her chin down to chest at all.  She is unable to move her neck    LABS: Results for orders placed in visit on 12/09/13  POCT CBC      Result Value Ref Range   WBC 12.7 (*) 4.6 - 10.2 K/uL   Lymph, poc 2.5  0.6 - 3.4   POC LYMPH PERCENT 19.8  10 - 50 %L   MID (cbc) 0.8  0 - 0.9   POC MID % 6.2  0 - 12 %M   POC Granulocyte 9.4 (*) 2 - 6.9   Granulocyte percent 74.0  37 - 80 %G   RBC 4.54  4.04 - 5.48 M/uL   Hemoglobin 13.7  12.2 - 16.2 g/dL   HCT, POC 16.142.2  09.637.7 - 47.9 %   MCV 93.1  80 - 97 fL   MCH, POC 30.1  27 - 31.2 pg   MCHC 32.3  31.8 - 35.4 g/dL   RDW, POC 04.514.3     Platelet Count, POC 185  142 - 424 K/uL   MPV 8.3  0 - 99.8 fL     EKG/XRAY:   Primary read [facility]. Nonspecific ST changes,     ASSESSMENT/PLAN: Encounter Diagnoses  Name Primary?  . Neck pain Yes  . Chest pain, unspecified chest pain type   . Headache, unspecified headache type    63 y/o female with DM, hyperlipidemia who is here for acute onset of left sided neck pain with HA and then CP . NKI. No URI sxs or rashes. Denies feers, chill, n.v/abd pain or light or noise sensitivity.  She was given ASA 8 1mg  x 3 tabs in office Nuchal pain and decrease neck ROM  With leukocystosis ? Meningitis vs torticollis vs subluxation Risk factors  For heart disease, send to Er for further evaluation neck pain and chest pain rule out.  Charge nurse notified.  D/w patient and sister, they are both in agreement with  plan   Gross sideeffects, risk and benefits, and alternatives of medications d/w patient. Patient is aware that all medications have potential sideeffects and we are unable to predict every sideeffect or drug-drug interaction that may occur.  LE, THAO PHUONG, DO 12/09/2013 1:46 PM  81mg  x4 given by me Okey Regal, CMA per Dr. Conley Rolls

## 2013-12-09 NOTE — Discharge Instructions (Signed)
Cervical Sprain with spasm A cervical sprain is an injury in the neck in which the strong, fibrous tissues (ligaments) that connect your neck bones stretch or tear. Cervical sprains can range from mild to severe. Severe cervical sprains can cause the neck vertebrae to be unstable. This can lead to damage of the spinal cord and can result in serious nervous system problems. The amount of time it takes for a cervical sprain to get better depends on the cause and extent of the injury. Most cervical sprains heal in 1 to 3 weeks. CAUSES  Severe cervical sprains may be caused by:   Contact sport injuries (such as from football, rugby, wrestling, hockey, auto racing, gymnastics, diving, martial arts, or boxing).   Motor vehicle collisions.   Whiplash injuries. This is an injury from a sudden forward and backward whipping movement of the head and neck.  Falls.  Mild cervical sprains may be caused by:   Being in an awkward position, such as while cradling a telephone between your ear and shoulder.   Sitting in a chair that does not offer proper support.   Working at a poorly Marketing executivedesigned computer station.   Looking up or down for long periods of time.  SYMPTOMS   Pain, soreness, stiffness, or a burning sensation in the front, back, or sides of the neck. This discomfort may develop immediately after the injury or slowly, 24 hours or more after the injury.   Pain or tenderness directly in the middle of the back of the neck.   Shoulder or upper back pain.   Limited ability to move the neck.   Headache.   Dizziness.   Weakness, numbness, or tingling in the hands or arms.   Muscle spasms.   Difficulty swallowing or chewing.   Tenderness and swelling of the neck.  DIAGNOSIS  Most of the time your health care provider can diagnose a cervical sprain by taking your history and doing a physical exam. Your health care provider will ask about previous neck injuries and any known  neck problems, such as arthritis in the neck. X-rays may be taken to find out if there are any other problems, such as with the bones of the neck. Other tests, such as a CT scan or MRI, may also be needed.  TREATMENT  Treatment depends on the severity of the cervical sprain. Mild sprains can be treated with rest, keeping the neck in place (immobilization), and pain medicines. Severe cervical sprains are immediately immobilized. Further treatment is done to help with pain, muscle spasms, and other symptoms and may include:  Medicines, such as pain relievers, numbing medicines, or muscle relaxants.   Physical therapy. This may involve stretching exercises, strengthening exercises, and posture training. Exercises and improved posture can help stabilize the neck, strengthen muscles, and help stop symptoms from returning.  HOME CARE INSTRUCTIONS   Put ice on the injured area.   Put ice in a plastic bag.   Place a towel between your skin and the bag.   Leave the ice on for 15-20 minutes, 3-4 times a day.   If your injury was severe, you may have been given a cervical collar to wear. A cervical collar is a two-piece collar designed to keep your neck from moving while it heals.  Do not remove the collar unless instructed by your health care provider.  If you have long hair, keep it outside of the collar.  Ask your health care provider before making any adjustments to your  collar. Minor adjustments may be required over time to improve comfort and reduce pressure on your chin or on the back of your head.  Ifyou are allowed to remove the collar for cleaning or bathing, follow your health care provider's instructions on how to do so safely.  Keep your collar clean by wiping it with mild soap and water and drying it completely. If the collar you have been given includes removable pads, remove them every 1-2 days and hand wash them with soap and water. Allow them to air dry. They should be  completely dry before you wear them in the collar.  If you are allowed to remove the collar for cleaning and bathing, wash and dry the skin of your neck. Check your skin for irritation or sores. If you see any, tell your health care provider.  Do not drive while wearing the collar.   Only take over-the-counter or prescription medicines for pain, discomfort, or fever as directed by your health care provider.   Keep all follow-up appointments as directed by your health care provider.   Keep all physical therapy appointments as directed by your health care provider.   Make any needed adjustments to your workstation to promote good posture.   Avoid positions and activities that make your symptoms worse.   Warm up and stretch before being active to help prevent problems.  SEEK MEDICAL CARE IF:   Your pain is not controlled with medicine.   You are unable to decrease your pain medicine over time as planned.   Your activity level is not improving as expected.  SEEK IMMEDIATE MEDICAL CARE IF:   You develop any bleeding.  You develop stomach upset.  You have signs of an allergic reaction to your medicine.   Your symptoms get worse.   You develop new, unexplained symptoms.   You have numbness, tingling, weakness, or paralysis in any part of your body.  MAKE SURE YOU:   Understand these instructions.  Will watch your condition.  Will get help right away if you are not doing well or get worse. Document Released: 12/19/2006 Document Revised: 02/26/2013 Document Reviewed: 08/29/2012 [facility] Patient Information 2015 [place], [place]. This information is not intended to replace advice given to you by your health care provider. Make sure you discuss any questions you have with your health care provider.

## 2013-12-09 NOTE — ED Provider Notes (Signed)
CSN: [id number]     Arrival date & time 12/09/13  1407 History   First MD Initiated Contact with Patient 12/09/13 1411     Chief Complaint  Patient presents with  . Neck Pain  . Chest Pain     (Consider location/radiation/quality/duration/timing/severity/associated sxs/prior Treatment) HPI  This is a 63 year old female with history of diabetes, hypertension, hyperlipidemia who presents with neck pain. Patient reports onset of neck pain this morning when she woke up. She states that she woke up and felt a twinge in her left neck. She felt that she slept on it wrong. She took a shower and was feeling better. However, after getting out of shower, she had acute worsening of pain. It is worse with movement. It now radiates into her anterior chest. She's not taking anything for the pain.  Currently her pain is 10 out of 10. She was seen at urgent care and given chest pain was referred here for further evaluation. Patient denies any shortness of breath, nausea, or diaphoresis.  Past Medical History  Diagnosis Date  . Arthritis 1999    knees  . Diabetes mellitus without complication 2005  . Hyperlipidemia   . Hypertension    Past Surgical History  Procedure Laterality Date  . Cesarean section    . Tubal ligation     Family History  Problem Relation Age of Onset  . Diabetes Mother   . Diabetes Sister   . Hyperlipidemia Sister   . Hypertension Sister   . Cancer Brother 16    Lung cancer x2 brothers  . Hypertension Brother    History  Substance Use Topics  . Smoking status: Never Smoker   . Smokeless tobacco: Not on file  . Alcohol Use: No   OB History   Grav Para Term Preterm Abortions TAB SAB Ect Mult Living                 Review of Systems  Constitutional: Negative for fever.  HENT: Negative for sore throat and trouble swallowing.   Respiratory: Negative for cough, chest tightness and shortness of breath.   Cardiovascular: Positive for chest pain.  Gastrointestinal:  Negative for nausea, vomiting and abdominal pain.  Genitourinary: Negative for dysuria.  Musculoskeletal: Positive for neck pain. Negative for back pain and neck stiffness.  Skin: Negative for wound.  Neurological: Negative for headaches.  All other systems reviewed and are negative.     Allergies  Betadine  Home Medications   Prior to Admission medications   Medication Sig Start Date End Date Taking? Authorizing Provider  aspirin 81 MG tablet Take 81 mg by mouth daily.   Yes Historical Provider, MD  calcium citrate-vitamin D 200-200 MG-UNIT TABS Take 2 tablets by mouth daily.   Yes Historical Provider, MD  insulin glargine (LANTUS) 100 UNIT/ML injection Inject 20 Units into the skin at bedtime. 02/08/13  Yes Tyrone Nine, MD  lisinopril (PRINIVIL,ZESTRIL) 10 MG tablet Take 1 tablet (10 mg total) by mouth daily. 02/05/13  Yes Tyrone Nine, MD  pravastatin (PRAVACHOL) 20 MG tablet Take 1 tablet (20 mg total) by mouth daily. 02/05/13  Yes Tyrone Nine, MD  cyclobenzaprine (FLEXERIL) 10 MG tablet Take 1 tablet (10 mg total) by mouth 2 (two) times daily as needed for muscle spasms. 12/09/13   Shon Baton, MD  HYDROcodone-acetaminophen (NORCO/VICODIN) 5-325 MG per tablet Take 1 tablet by mouth every 4 (four) hours as needed for moderate pain or severe pain. 12/09/13   Toni Amend  F Horton, MD  ibuprofen (ADVIL,MOTRIN) 600 MG tablet Take 1 tablet (600 mg total) by mouth every 6 (six) hours as needed for moderate pain. Limit to 3-5 days. 12/09/13   Shon Batonourtney F Horton, MD   BP 128/76  Pulse 76  Temp(Src) 97.9 F (36.6 C) (Oral)  Resp 24  Ht 5\' 3"  (1.6 m)  Wt 210 lb (95.255 kg)  BMI 37.21 kg/m2  SpO2 99% Physical Exam  Nursing note and vitals reviewed. Constitutional: She is oriented to person, place, and time. She appears well-developed and well-nourished. No distress.  HENT:  Head: Normocephalic and atraumatic.  Mouth/Throat: Oropharynx is clear and moist. No oropharyngeal exudate.   Eyes: Pupils are equal, round, and reactive to light.  Neck:  Range of motion limited secondary to pain, limited range of motion to the left, tenderness palpation of the sternocleidomastoid extending into the anterior chest with spasm, mild torticollis noted  Cardiovascular: Normal rate, regular rhythm and normal heart sounds.   No murmur heard. Pulmonary/Chest: Effort normal and breath sounds normal. No respiratory distress. She has no wheezes. She exhibits tenderness.  Abdominal: Soft. Bowel sounds are normal. There is no tenderness. There is no rebound.  Lymphadenopathy:    She has no cervical adenopathy.  Neurological: She is alert and oriented to person, place, and time.  Skin: Skin is warm and dry.  Psychiatric: She has a normal mood and affect.    ED Course  Procedures (including critical care time) Labs Review Labs Reviewed  Rosezena Sensor-STAT TROPOININ, ED    Imaging Review Dg Neck Soft Tissue  12/09/2013   CLINICAL DATA:  Acute onset intermittent left-sided neck pain this morning.  EXAM: NECK SOFT TISSUES - 1+ VIEW  COMPARISON:  None.  FINDINGS: Prevertebral soft tissues are within normal limits. Epiglottic and aryepiglottic fold shadows are sharp. Degenerative changes are seen in the spine.  IMPRESSION: 1. No acute findings to explain the patient's left neck pain. 2. Cervical spondylosis, incidentally imaged.   Electronically Signed   By: Leanna BattlesMelinda  Blietz M.D.   On: 12/09/2013 15:27   Dg Chest 2 View  12/09/2013   CLINICAL DATA:  Left chest pain today.  EXAM: CHEST  2 VIEW  COMPARISON:  02/22/2010.  FINDINGS: Stable borderline enlarged cardiac silhouette. Mild residual linear scarring in the right lower lung zone. Mild thoracic spine degenerative changes. No fracture, pneumothorax or pleural fluid seen.  IMPRESSION: No acute abnormality.  Stable borderline cardiomegaly.   Electronically Signed   By: Gordan PaymentSteve  Reid M.D.   On: 12/09/2013 15:27     EKG Interpretation None      MDM    Final diagnoses:  Muscle spasms of neck    Patient presents with neck pain. Occurred when she woke up this morning and acutely worsened. It is now radiating into her chest. Pain is reproducible on exam and she has evidence of muscle spasm and mild torticollis. EKG is reassuring.  Low suspicion for ACS given physical exam and history; however, troponin and chest x-ray obtained and reassuring. Patient given Toradol, Flexeril, and Norco. Patient had improvement of her symptoms. Range of motion improved on repeat exam.  Suspect muscle spasm and cervical strain. No oropharyngeal changes suggestive of deep space infection.  After history, exam, and medical workup I feel the patient has been appropriately medically screened and is safe for discharge home. Pertinent diagnoses were discussed with the patient. Patient was given return precautions.     Shon Batonourtney F Horton, MD 12/09/13 [phone]

## 2014-01-02 ENCOUNTER — Other Ambulatory Visit (HOSPITAL_COMMUNITY): Payer: Self-pay | Admitting: Nurse Practitioner

## 2014-01-02 DIAGNOSIS — Z1231 Encounter for screening mammogram for malignant neoplasm of breast: Secondary | ICD-10-CM

## 2014-02-10 ENCOUNTER — Ambulatory Visit (HOSPITAL_COMMUNITY)
Admission: RE | Admit: 2014-02-10 | Discharge: 2014-02-10 | Disposition: A | Discharge status: Home / Self Care | Payer: Medicare HMO | Source: Ambulatory Visit | Attending: Nurse Practitioner | Admitting: Nurse Practitioner

## 2014-02-10 DIAGNOSIS — Z1231 Encounter for screening mammogram for malignant neoplasm of breast: Secondary | ICD-10-CM | POA: Diagnosis not present

## 2015-01-06 ENCOUNTER — Other Ambulatory Visit: Payer: Self-pay

## 2015-01-06 DIAGNOSIS — Z1231 Encounter for screening mammogram for malignant neoplasm of breast: Secondary | ICD-10-CM

## 2015-02-12 ENCOUNTER — Ambulatory Visit
Admission: RE | Admit: 2015-02-12 | Discharge: 2015-02-12 | Disposition: A | Discharge status: Home / Self Care | Payer: Medicare HMO | Source: Ambulatory Visit

## 2015-02-12 DIAGNOSIS — Z1231 Encounter for screening mammogram for malignant neoplasm of breast: Secondary | ICD-10-CM

## 2016-01-13 ENCOUNTER — Other Ambulatory Visit: Payer: Self-pay | Admitting: Specialist

## 2016-01-13 DIAGNOSIS — Z1231 Encounter for screening mammogram for malignant neoplasm of breast: Secondary | ICD-10-CM

## 2016-02-17 ENCOUNTER — Ambulatory Visit
Admission: RE | Admit: 2016-02-17 | Discharge: 2016-02-17 | Disposition: A | Discharge status: Home / Self Care | Payer: Medicare HMO | Source: Ambulatory Visit | Attending: Specialist | Admitting: Specialist

## 2016-02-17 DIAGNOSIS — Z1231 Encounter for screening mammogram for malignant neoplasm of breast: Secondary | ICD-10-CM

## 2017-01-06 ENCOUNTER — Other Ambulatory Visit: Payer: Self-pay | Admitting: Specialist

## 2017-01-06 DIAGNOSIS — Z1231 Encounter for screening mammogram for malignant neoplasm of breast: Secondary | ICD-10-CM

## 2017-01-12 ENCOUNTER — Encounter: Payer: Self-pay | Admitting: Gastroenterology

## 2017-02-17 ENCOUNTER — Ambulatory Visit
Admission: RE | Admit: 2017-02-17 | Discharge: 2017-02-17 | Disposition: A | Discharge status: Home / Self Care | Payer: Medicare HMO | Source: Ambulatory Visit | Attending: Specialist | Admitting: Specialist

## 2017-02-17 DIAGNOSIS — Z1231 Encounter for screening mammogram for malignant neoplasm of breast: Secondary | ICD-10-CM

## 2017-02-27 ENCOUNTER — Ambulatory Visit (HOSPITAL_COMMUNITY)
Admission: EM | Admit: 2017-02-27 | Discharge: 2017-02-27 | Disposition: A | Discharge status: Home / Self Care | Payer: Medicare HMO | Attending: Internal Medicine | Admitting: Internal Medicine

## 2017-02-27 ENCOUNTER — Encounter (HOSPITAL_COMMUNITY): Payer: Self-pay | Admitting: Emergency Medicine

## 2017-02-27 DIAGNOSIS — R220 Localized swelling, mass and lump, head: Secondary | ICD-10-CM | POA: Diagnosis not present

## 2017-02-27 MED ORDER — PREDNISONE 20 MG PO TABS: 40.0000 mg | ORAL_TABLET | Freq: Every day | ORAL | 0 refills | Status: AC

## 2017-02-27 MED ORDER — PREDNISONE 20 MG PO TABS
60.0000 mg | ORAL_TABLET | Freq: Once | ORAL | Status: AC
  Administered 2017-02-27: 60 mg via ORAL

## 2017-02-27 MED ORDER — PREDNISONE 20 MG PO TABS
ORAL_TABLET | ORAL | Status: AC
  Filled 2017-02-27: qty 3

## 2017-02-27 MED ORDER — CETIRIZINE HCL 10 MG PO TABS: 10.0000 mg | ORAL_TABLET | Freq: Every day | ORAL | 0 refills | Status: DC

## 2017-02-27 NOTE — ED Provider Notes (Addendum)
[facility]    CSN: [id number] Arrival date & time: 02/27/17  1531     History   Chief Complaint Chief Complaint  Patient presents with  . Oral Swelling    HPI Linda Arnold is a 66 y.o. female.   HPI  Past Medical History:  Diagnosis Date  . Arthritis 1999   knees  . Diabetes mellitus without complication (HCC) 2005  . Dysphagia   . Hyperlipidemia   . Hypertension     Patient Active Problem List   Diagnosis Date Noted  . Contact dermatitis 11/16/2012  . Osteoarthritis of right knee 10/29/2012  . Right knee pain 08/08/2012  . Hypertension 02/06/2012  . HEADACHE 06/09/2008  . DIABETES MELLITUS, TYPE II, CONTROLLED, W/O COMPLICATIONS 12/25/2006  . OBESITY, MODERATE 12/25/2006  . 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 TROCHANTERIC BURSITIS, LEFT 12/25/2006  . HYPERCHOLESTEROLEMIA 11/16/2006    Past Surgical History:  Procedure Laterality Date  . CESAREAN SECTION    . TUBAL LIGATION      OB History    No data available       Home Medications    Prior to Admission medications   Medication Sig Start Date End Date Taking? Authorizing Provider  aspirin 81 MG tablet Take 81 mg by mouth daily.   Yes [provider]  calcium citrate-vitamin D 200-200 MG-UNIT TABS Take 2 tablets by mouth daily.   Yes [provider]  metFORMIN (GLUCOPHAGE) 500 MG tablet Take 500 mg by mouth daily.   Yes [provider]  pravastatin (PRAVACHOL) 20 MG tablet Take 1 tablet (20 mg total) by mouth daily. 02/05/13  Yes Tyrone NineGrunz, Ryan B, MD  cetirizine (ZYRTEC) 10 MG tablet Take 1 tablet (10 mg total) by mouth daily. 02/27/17   Georgetta HaberBurky, Marua Qin B, NP  ibuprofen (ADVIL,MOTRIN) 600 MG tablet Take 1 tablet (600 mg total) by mouth every 6 (six) hours as needed for moderate pain. Limit to 3-5 days. 12/09/13   Horton, Mayer Maskerourtney F, MD  predniSONE (DELTASONE) 20 MG tablet Take 2 tablets (40 mg total) by mouth daily with breakfast for 5 days. 02/27/17 03/04/17   Georgetta HaberBurky, Shoshanna Mcquitty B, NP    Family History Family History  Problem Relation Age of Onset  . Diabetes Mother   . Diabetes Sister   . Hyperlipidemia Sister   . Hypertension Sister   . Cancer Brother [id number]       Lung cancer x2 brothers  . Hypertension Brother     Social History Social History   Tobacco Use  . Smoking status: Never Smoker  . Smokeless tobacco: Never Used  Substance Use Topics  . Alcohol use: Not on file  . Drug use: No     Allergies   Betadine [povidone iodine] and Lisinopril   Review of Systems Review of Systems   Physical Exam Triage Vital Signs ED Triage Vitals [02/27/17 1552]  Enc Vitals Group     BP 139/82     Pulse Rate 82     Resp 16     Temp 98.5 F (36.9 C)     Temp Source Oral     SpO2 100 %     Weight      Height      Head Circumference      Peak Flow      Pain Score      Pain Loc      Pain Edu?      Excl. in GC?    No data found.  Updated Vital Signs BP 139/82 (BP Location: Left Arm)   Pulse 82   Temp 98.5 F (36.9 C) (Oral)   Resp 16   SpO2 100%   Visual Acuity Right Eye Distance:   Left Eye Distance:   Bilateral Distance:    Right Eye Near:   Left Eye Near:    Bilateral Near:     Physical Exam  Constitutional: She is oriented to person, place, and time. She appears well-developed and well-nourished. No distress.  HENT:  Mouth/Throat: Uvula is midline and oropharynx is clear and moist.  Cardiovascular: Normal rate, regular rhythm and normal heart sounds.  Pulmonary/Chest: Effort normal and breath sounds normal. She has no wheezes.  Neurological: She is alert and oriented to person, place, and time.  Skin: Skin is warm and dry.  Left upper lip with localized area of redness swelling and firm to touch, approximately 1inch in diameter, with surrounding lip and face swelling; right clavicle with similar area of redness and firmness with minimal swelling; non tender, without drainage or vesicle         UC  Treatments / Results  Labs (all labs ordered are listed, but only abnormal results are displayed) Labs Reviewed - No data to display  EKG  EKG Interpretation None       Radiology No results found.  Procedures Procedures (including critical care time)  Medications Ordered in UC Medications  predniSONE (DELTASONE) tablet 60 mg (60 mg Oral Given 02/27/17 1658)     Initial Impression / Assessment and Plan / UC Course  I have reviewed the triage vital signs and the nursing notes.  Pertinent labs & imaging results that were available during my care of the patient were reviewed by me and considered in my medical decision making (see chart for details).     Swelling and redness appears to be allergic in nature, although patient does not know of any exposures or bug bites. Will treat with prednisone. Take another dose of benadryl tonight, followed by daily zyrtec. Return precautions provided. Patient verbalized understanding and agreeable to plan.    Final Clinical Impressions(s) / UC Diagnoses   Final diagnoses:  Swelling of upper lip    ED Discharge Orders        Ordered    predniSONE (DELTASONE) 20 MG tablet  Daily with breakfast     02/27/17 1650    cetirizine (ZYRTEC) 10 MG tablet  Daily     02/27/17 1650       Controlled Substance Prescriptions [place] Controlled Substance Registry consulted? Not Applicable   Georgetta HaberBurky, Llewelyn Sheaffer B, NP 02/27/17 1701    Georgetta HaberBurky, Mahin Guardia B, NP 02/27/17 [phone]

## 2017-02-27 NOTE — ED Triage Notes (Signed)
PT C/O: reports swelling of upper lip and left side of face.... Also reports knot on right shoulder ... Sts she took Theraflu last night around 2130 for a scratchy throat  [place]  DENIES: inj/trauma, Dyspnea, dysphagia, SOB  TAKING MEDS: Benadryl   A&O x4... NAD... Ambulatory

## 2017-02-27 NOTE — Discharge Instructions (Signed)
The two areas of redness and swelling look likely to be allergic in nature, possibly even a bug bite? Please take a daily zyrtec as well as 5 days of prednisone to help with swelling. Start prednisone tomorrow, we will provide first dose here today. Cold application may help.  If develop increased swelling, swelling to tongue, throat, difficulty swallowing, speaking or breathing please go to Er.

## 2017-03-06 ENCOUNTER — Ambulatory Visit: Payer: Medicare HMO | Admitting: Gastroenterology

## 2017-10-24 DIAGNOSIS — R5383 Other fatigue: Secondary | ICD-10-CM | POA: Diagnosis not present

## 2017-10-24 DIAGNOSIS — I10 Essential (primary) hypertension: Secondary | ICD-10-CM | POA: Diagnosis not present

## 2017-10-24 DIAGNOSIS — E1100 Type 2 diabetes mellitus with hyperosmolarity without nonketotic hyperglycemic-hyperosmolar coma (NKHHC): Secondary | ICD-10-CM | POA: Diagnosis not present

## 2017-10-24 DIAGNOSIS — E118 Type 2 diabetes mellitus with unspecified complications: Secondary | ICD-10-CM | POA: Diagnosis not present

## 2017-10-24 DIAGNOSIS — Z6834 Body mass index (BMI) 34.0-34.9, adult: Secondary | ICD-10-CM | POA: Diagnosis not present

## 2017-10-24 DIAGNOSIS — E783 Hyperchylomicronemia: Secondary | ICD-10-CM | POA: Diagnosis not present

## 2017-11-07 DIAGNOSIS — Z1211 Encounter for screening for malignant neoplasm of colon: Secondary | ICD-10-CM | POA: Diagnosis not present

## 2017-11-07 DIAGNOSIS — Z1212 Encounter for screening for malignant neoplasm of rectum: Secondary | ICD-10-CM | POA: Diagnosis not present

## 2017-11-10 DIAGNOSIS — I10 Essential (primary) hypertension: Secondary | ICD-10-CM | POA: Diagnosis not present

## 2017-11-10 DIAGNOSIS — E782 Mixed hyperlipidemia: Secondary | ICD-10-CM | POA: Diagnosis not present

## 2017-11-10 DIAGNOSIS — E1100 Type 2 diabetes mellitus with hyperosmolarity without nonketotic hyperglycemic-hyperosmolar coma (NKHHC): Secondary | ICD-10-CM | POA: Diagnosis not present

## 2017-11-10 DIAGNOSIS — Z6834 Body mass index (BMI) 34.0-34.9, adult: Secondary | ICD-10-CM | POA: Diagnosis not present

## 2017-11-15 ENCOUNTER — Encounter (HOSPITAL_COMMUNITY): Payer: Self-pay

## 2017-11-15 ENCOUNTER — Ambulatory Visit (HOSPITAL_COMMUNITY)
Admission: EM | Admit: 2017-11-15 | Discharge: 2017-11-15 | Disposition: A | Discharge status: Home / Self Care | Payer: Medicare HMO | Attending: Family Medicine | Admitting: Family Medicine

## 2017-11-15 DIAGNOSIS — R5383 Other fatigue: Secondary | ICD-10-CM | POA: Diagnosis not present

## 2017-11-15 DIAGNOSIS — R531 Weakness: Secondary | ICD-10-CM | POA: Diagnosis not present

## 2017-11-15 DIAGNOSIS — R51 Headache: Secondary | ICD-10-CM | POA: Diagnosis not present

## 2017-11-15 DIAGNOSIS — R42 Dizziness and giddiness: Secondary | ICD-10-CM

## 2017-11-15 LAB — POCT I-STAT, CHEM 8
BUN: 11 mg/dL (ref 8–23)
CHLORIDE: 99 mmol/L (ref 98–111)
Calcium, Ion: 1.07 mmol/L — ABNORMAL LOW (ref 1.15–1.40)
Creatinine, Ser: 0.7 mg/dL (ref 0.44–1.00)
Glucose, Bld: 145 mg/dL — ABNORMAL HIGH (ref 70–99)
HCT: 45 % (ref 36.0–46.0)
HEMOGLOBIN: 15.3 g/dL — AB (ref 12.0–15.0)
POTASSIUM: 3.8 mmol/L (ref 3.5–5.1)
SODIUM: 137 mmol/L (ref 135–145)
TCO2: 26 mmol/L (ref 22–32)

## 2017-11-15 LAB — POCT URINALYSIS DIP (DEVICE)
Bilirubin Urine: NEGATIVE
GLUCOSE, UA: NEGATIVE mg/dL
Hgb urine dipstick: NEGATIVE
LEUKOCYTES UA: NEGATIVE
Nitrite: NEGATIVE
PROTEIN: NEGATIVE mg/dL
SPECIFIC GRAVITY, URINE: 1.015 (ref 1.005–1.030)
UROBILINOGEN UA: 0.2 mg/dL (ref 0.0–1.0)
pH: 8.5 — ABNORMAL HIGH (ref 5.0–8.0)

## 2017-11-15 LAB — GLUCOSE, CAPILLARY: Glucose-Capillary: 143 mg/dL — ABNORMAL HIGH (ref 70–99)

## 2017-11-15 NOTE — ED Triage Notes (Signed)
Pt presents with headache and  body weakness.

## 2017-11-15 NOTE — Discharge Instructions (Addendum)
You have been seen at the [facility] today for weakness Your evaluation today was not suggestive of any emergent condition requiring medical intervention at this time. Your EKG did not show any acute changes. However, some medical problems make take more time to appear. Therefore, it is important for you to watch for any new symptoms or worsening of your current condition.  Please return the Urgent Care or to the Emergency Department immediately should you feel worse in any way.  Please do your best to ensure adequate fluid intake in order to avoid dehydration.

## 2017-11-15 NOTE — ED Notes (Signed)
Pt blood glucose level was 143

## 2017-11-15 NOTE — ED Notes (Addendum)
Pt's family came to the front desk to say that the patient was feeling really poorly, was slumped in her wheelchair and needed to lie down.  Pt was breathing fast and heavy and was leaning to one side.  She motioned to her head, face and chest and stated she just feels bad and needs to lie down.  Pt was brought to room 10 and put on a bed.  A provider was asked to come to the room to assess the pt.  Pt is A&O w/ VSS.  Pt reports that her Metformin was doubled from 500mg  to 1000mg  on the fourth along with doubling her dose of Pravachol from 20mg  to 40mg .  She reports dizziness since changing her medications.

## 2017-11-16 DIAGNOSIS — Z6834 Body mass index (BMI) 34.0-34.9, adult: Secondary | ICD-10-CM | POA: Diagnosis not present

## 2017-11-16 DIAGNOSIS — E1100 Type 2 diabetes mellitus with hyperosmolarity without nonketotic hyperglycemic-hyperosmolar coma (NKHHC): Secondary | ICD-10-CM | POA: Diagnosis not present

## 2017-11-16 DIAGNOSIS — R112 Nausea with vomiting, unspecified: Secondary | ICD-10-CM | POA: Diagnosis not present

## 2017-11-16 DIAGNOSIS — R42 Dizziness and giddiness: Secondary | ICD-10-CM | POA: Diagnosis not present

## 2017-11-16 DIAGNOSIS — E782 Mixed hyperlipidemia: Secondary | ICD-10-CM | POA: Diagnosis not present

## 2017-11-16 DIAGNOSIS — I10 Essential (primary) hypertension: Secondary | ICD-10-CM | POA: Diagnosis not present

## 2017-11-27 DIAGNOSIS — I10 Essential (primary) hypertension: Secondary | ICD-10-CM | POA: Diagnosis not present

## 2017-11-27 DIAGNOSIS — E1100 Type 2 diabetes mellitus with hyperosmolarity without nonketotic hyperglycemic-hyperosmolar coma (NKHHC): Secondary | ICD-10-CM | POA: Diagnosis not present

## 2017-11-27 DIAGNOSIS — E782 Mixed hyperlipidemia: Secondary | ICD-10-CM | POA: Diagnosis not present

## 2017-11-27 DIAGNOSIS — Z6834 Body mass index (BMI) 34.0-34.9, adult: Secondary | ICD-10-CM | POA: Diagnosis not present

## 2017-11-28 NOTE — ED Provider Notes (Signed)
[facility]   [id number] 11/15/17 Arrival Time: 1526  ASSESSMENT & PLAN:  1. Weakness   2. Dizziness    Suspect positional vertigo. Discussed.  The patient is reassured that these symptoms do not appear to represent a serious or threatening condition. This is generally a self-limited temporary but uncomfortable situation. Rest, avoid potentially dangerous activities (such as driving or working with machinery or at heights). Use OTC Meclizine prn. Will proceed to the ED if he develops other symptoms such as alterations of speech, swallowing, vision, motor/sensory systems, or if dizziness worsens.  Reviewed expectations re: course of current medical issues. Questions answered. Outlined signs and symptoms indicating need for more acute intervention. Patient verbalized understanding. After Visit Summary given.   SUBJECTIVE:  Linda Arnold is a 67 y.o. female who presents for evaluation of fatigue and "feeling a little weak". Also reports transient and sporadic "feeling like I'm a little dizzy." Dizziness described as mild vertigo. Symptoms began on and off over the past few days and have stabilized. Frequency: intermittently lasting a few seconds. Related to certain movements. Positions that worsen symptoms: bending over. Previous workup/treatments: none. Associated ear symptoms: none. Associated CNS symptoms: none. Patient denies otalgia tinnitus hearing loss. Recent infections: none. Head trauma: denied. Drug ingestion: none. Noise exposure: no occupational exposure. Family history: no h/o stroke/TIA/vertigo reported. No headaches or visual changes. Normal bowel/bladder habits. Ambulatory without difficulty.  No new medications.  ROS: As per HPI. All other systems negative.   OBJECTIVE:  Vitals:   11/15/17 1536 11/15/17 1604 11/15/17 1610 11/15/17 1614  BP: 100/70 122/63 114/76 109/84  Pulse: 92 90 87 (!) 107  Resp: 20 20 18  (!) 24  Temp:  98.4 F (36.9 C)    TempSrc:  Oral Oral    SpO2: 100% 100% 100% 100%    General appearance: alert; no distress Eyes: PERRLA; EOMI; conjunctiva normal HENT: normocephalic; atraumatic; TMs normal; nasal mucosa normal; oral mucosa normal Neck: supple with FROM Lungs: clear to auscultation bilaterally Heart: regular rate and rhythm Abdomen: soft, non-tender; bowel sounds normal Extremities: no cyanosis or edema; symmetrical with no gross deformities Skin: warm and dry Neurologic: normal gait; DTR's normal and symmetric.; CN 2-12 grossly intact; rapid changes in position during the exam do precipitate brief dizziness without nystagmus; recovers quickly Psychological: alert and cooperative; normal mood and affect  Investigations: Results for orders placed or performed during the hospital encounter of 11/15/17  Glucose, capillary  Result Value Ref Range   Glucose-Capillary 143 (H) 70 - 99 mg/dL  I-STAT, chem 8  Result Value Ref Range   Sodium 137 135 - 145 mmol/L   Potassium 3.8 3.5 - 5.1 mmol/L   Chloride 99 98 - 111 mmol/L   BUN 11 8 - 23 mg/dL   Creatinine, Ser 4.090.70 0.44 - 1.00 mg/dL   Glucose, Bld 811145 (H) 70 - 99 mg/dL   Calcium, Ion 9.141.07 (L) 1.15 - 1.40 mmol/L   TCO2 26 22 - 32 mmol/L   Hemoglobin 15.3 (H) 12.0 - 15.0 g/dL   HCT 78.245.0 95.636.0 - 21.346.0 %  POCT urinalysis dip (device)  Result Value Ref Range   Glucose, UA NEGATIVE NEGATIVE mg/dL   Bilirubin Urine NEGATIVE NEGATIVE   Ketones, ur TRACE (A) NEGATIVE mg/dL   Specific Gravity, Urine 1.015 1.005 - 1.030   Hgb urine dipstick NEGATIVE NEGATIVE   pH 8.5 (H) 5.0 - 8.0   Protein, ur NEGATIVE NEGATIVE mg/dL   Urobilinogen, UA 0.2 0.0 - 1.0 mg/dL  Nitrite NEGATIVE NEGATIVE   Leukocytes, UA NEGATIVE NEGATIVE   Labs Reviewed  GLUCOSE, CAPILLARY - Abnormal; Notable for the following components:      Result Value   Glucose-Capillary 143 (*)    All other components within normal limits  POCT I-STAT, CHEM 8 - Abnormal; Notable for the following components:     Glucose, Bld 145 (*)    Calcium, Ion 1.07 (*)    Hemoglobin 15.3 (*)    All other components within normal limits  POCT URINALYSIS DIP (DEVICE) - Abnormal; Notable for the following components:   Ketones, ur TRACE (*)    pH 8.5 (*)    All other components within normal limits   No results found.  Allergies  Allergen Reactions  . Betadine [Povidone Iodine] Rash  . Lisinopril     Past Medical History:  Diagnosis Date  . Arthritis 1999   knees  . Diabetes mellitus without complication (HCC) 2005  . Dysphagia   . Hyperlipidemia   . Hypertension    Social History   Socioeconomic History  . Marital status: Widowed    Spouse name: Not on file  . Number of children: Not on file  . Years of education: Not on file  . Highest education level: Not on file  Occupational History  . Occupation: retired  Engineer, production  . Financial resource strain: Not on file  . Food insecurity:    Worry: Not on file    Inability: Not on file  . Transportation needs:    Medical: Not on file    Non-medical: Not on file  Tobacco Use  . Smoking status: Never Smoker  . Smokeless tobacco: Never Used  Substance and Sexual Activity  . Alcohol use: Not on file  . Drug use: No  . Sexual activity: Not Currently  Lifestyle  . Physical activity:    Days per week: Not on file    Minutes per session: Not on file  . Stress: Not on file  Relationships  . Social connections:    Talks on phone: Not on file    Gets together: Not on file    Attends religious service: Not on file    Active member of club or organization: Not on file    Attends meetings of clubs or organizations: Not on file    Relationship status: Not on file  . Intimate partner violence:    Fear of current or ex partner: Not on file    Emotionally abused: Not on file    Physically abused: Not on file    Forced sexual activity: Not on file  Other Topics Concern  . Not on file  Social History Narrative   Widowed and unemployed.  Attended some college. Walks infrequently for exercise. No tobacco or drug use. Previously went to [employer] until 2013.   Family History  Problem Relation Age of Onset  . Diabetes Mother   . Diabetes Sister   . Hyperlipidemia Sister   . Hypertension Sister   . Cancer Brother 76       Lung cancer x2 brothers  . Hypertension Brother    Past Surgical History:  Procedure Laterality Date  . CESAREAN SECTION    . TUBAL LIGATION        Mardella Layman, MD 11/28/17 [phone]

## 2017-12-13 DIAGNOSIS — Z780 Asymptomatic menopausal state: Secondary | ICD-10-CM | POA: Diagnosis not present

## 2018-01-08 ENCOUNTER — Other Ambulatory Visit: Payer: Self-pay | Admitting: Family Medicine

## 2018-01-08 DIAGNOSIS — E782 Mixed hyperlipidemia: Secondary | ICD-10-CM | POA: Diagnosis not present

## 2018-01-08 DIAGNOSIS — Z6833 Body mass index (BMI) 33.0-33.9, adult: Secondary | ICD-10-CM | POA: Diagnosis not present

## 2018-01-08 DIAGNOSIS — I10 Essential (primary) hypertension: Secondary | ICD-10-CM | POA: Diagnosis not present

## 2018-01-08 DIAGNOSIS — R3915 Urgency of urination: Secondary | ICD-10-CM | POA: Diagnosis not present

## 2018-01-08 DIAGNOSIS — Z1231 Encounter for screening mammogram for malignant neoplasm of breast: Secondary | ICD-10-CM

## 2018-01-08 DIAGNOSIS — E1100 Type 2 diabetes mellitus with hyperosmolarity without nonketotic hyperglycemic-hyperosmolar coma (NKHHC): Secondary | ICD-10-CM | POA: Diagnosis not present

## 2018-02-16 DIAGNOSIS — I10 Essential (primary) hypertension: Secondary | ICD-10-CM | POA: Diagnosis not present

## 2018-02-16 DIAGNOSIS — E782 Mixed hyperlipidemia: Secondary | ICD-10-CM | POA: Diagnosis not present

## 2018-02-16 DIAGNOSIS — E1100 Type 2 diabetes mellitus with hyperosmolarity without nonketotic hyperglycemic-hyperosmolar coma (NKHHC): Secondary | ICD-10-CM | POA: Diagnosis not present

## 2018-02-19 ENCOUNTER — Ambulatory Visit
Admission: RE | Admit: 2018-02-19 | Discharge: 2018-02-19 | Disposition: A | Discharge status: Home / Self Care | Payer: Medicare HMO | Source: Ambulatory Visit | Attending: Family Medicine | Admitting: Family Medicine

## 2018-02-19 DIAGNOSIS — Z1231 Encounter for screening mammogram for malignant neoplasm of breast: Secondary | ICD-10-CM

## 2018-02-20 DIAGNOSIS — I10 Essential (primary) hypertension: Secondary | ICD-10-CM | POA: Diagnosis not present

## 2018-02-20 DIAGNOSIS — E1100 Type 2 diabetes mellitus with hyperosmolarity without nonketotic hyperglycemic-hyperosmolar coma (NKHHC): Secondary | ICD-10-CM | POA: Diagnosis not present

## 2018-02-20 DIAGNOSIS — Z6834 Body mass index (BMI) 34.0-34.9, adult: Secondary | ICD-10-CM | POA: Diagnosis not present

## 2018-02-20 DIAGNOSIS — E785 Hyperlipidemia, unspecified: Secondary | ICD-10-CM | POA: Diagnosis not present

## 2018-02-20 DIAGNOSIS — Z0000 Encounter for general adult medical examination without abnormal findings: Secondary | ICD-10-CM | POA: Diagnosis not present

## 2018-03-29 DIAGNOSIS — E119 Type 2 diabetes mellitus without complications: Secondary | ICD-10-CM | POA: Diagnosis not present

## 2018-03-29 DIAGNOSIS — E7800 Pure hypercholesterolemia, unspecified: Secondary | ICD-10-CM | POA: Diagnosis not present

## 2018-03-29 DIAGNOSIS — H5010 Unspecified exotropia: Secondary | ICD-10-CM | POA: Diagnosis not present

## 2018-03-29 DIAGNOSIS — Z0100 Encounter for examination of eyes and vision without abnormal findings: Secondary | ICD-10-CM | POA: Diagnosis not present

## 2018-08-14 ENCOUNTER — Other Ambulatory Visit: Payer: Self-pay

## 2018-08-14 ENCOUNTER — Emergency Department (HOSPITAL_COMMUNITY): Payer: Medicare HMO

## 2018-08-14 ENCOUNTER — Inpatient Hospital Stay (HOSPITAL_COMMUNITY)
Admission: EM | Admit: 2018-08-14 | Discharge: 2018-08-16 | DRG: 071 | Disposition: A | Discharge status: Home / Self Care | Payer: Medicare HMO | Attending: Internal Medicine | Admitting: Internal Medicine

## 2018-08-14 ENCOUNTER — Observation Stay (HOSPITAL_COMMUNITY): Payer: Medicare HMO

## 2018-08-14 DIAGNOSIS — Z833 Family history of diabetes mellitus: Secondary | ICD-10-CM | POA: Diagnosis not present

## 2018-08-14 DIAGNOSIS — Z1159 Encounter for screening for other viral diseases: Secondary | ICD-10-CM | POA: Diagnosis not present

## 2018-08-14 DIAGNOSIS — G454 Transient global amnesia: Secondary | ICD-10-CM | POA: Diagnosis present

## 2018-08-14 DIAGNOSIS — I10 Essential (primary) hypertension: Secondary | ICD-10-CM | POA: Diagnosis present

## 2018-08-14 DIAGNOSIS — D72829 Elevated white blood cell count, unspecified: Secondary | ICD-10-CM

## 2018-08-14 DIAGNOSIS — R4182 Altered mental status, unspecified: Secondary | ICD-10-CM

## 2018-08-14 DIAGNOSIS — R413 Other amnesia: Secondary | ICD-10-CM | POA: Diagnosis not present

## 2018-08-14 DIAGNOSIS — E872 Acidosis, unspecified: Secondary | ICD-10-CM

## 2018-08-14 DIAGNOSIS — I639 Cerebral infarction, unspecified: Secondary | ICD-10-CM | POA: Diagnosis not present

## 2018-08-14 DIAGNOSIS — R410 Disorientation, unspecified: Secondary | ICD-10-CM

## 2018-08-14 DIAGNOSIS — Z7984 Long term (current) use of oral hypoglycemic drugs: Secondary | ICD-10-CM

## 2018-08-14 DIAGNOSIS — G9340 Encephalopathy, unspecified: Secondary | ICD-10-CM | POA: Diagnosis not present

## 2018-08-14 DIAGNOSIS — G9341 Metabolic encephalopathy: Secondary | ICD-10-CM | POA: Diagnosis present

## 2018-08-14 DIAGNOSIS — Z791 Long term (current) use of non-steroidal anti-inflammatories (NSAID): Secondary | ICD-10-CM | POA: Diagnosis not present

## 2018-08-14 DIAGNOSIS — E119 Type 2 diabetes mellitus without complications: Secondary | ICD-10-CM | POA: Diagnosis present

## 2018-08-14 DIAGNOSIS — E785 Hyperlipidemia, unspecified: Secondary | ICD-10-CM | POA: Diagnosis present

## 2018-08-14 DIAGNOSIS — R001 Bradycardia, unspecified: Secondary | ICD-10-CM | POA: Diagnosis not present

## 2018-08-14 DIAGNOSIS — I672 Cerebral atherosclerosis: Secondary | ICD-10-CM | POA: Diagnosis not present

## 2018-08-14 DIAGNOSIS — Z6837 Body mass index (BMI) 37.0-37.9, adult: Secondary | ICD-10-CM

## 2018-08-14 DIAGNOSIS — Z7982 Long term (current) use of aspirin: Secondary | ICD-10-CM | POA: Diagnosis not present

## 2018-08-14 DIAGNOSIS — R404 Transient alteration of awareness: Secondary | ICD-10-CM | POA: Diagnosis not present

## 2018-08-14 DIAGNOSIS — E669 Obesity, unspecified: Secondary | ICD-10-CM | POA: Diagnosis present

## 2018-08-14 DIAGNOSIS — D72828 Other elevated white blood cell count: Secondary | ICD-10-CM | POA: Diagnosis not present

## 2018-08-14 DIAGNOSIS — Z79899 Other long term (current) drug therapy: Secondary | ICD-10-CM | POA: Diagnosis not present

## 2018-08-14 DIAGNOSIS — Z03818 Encounter for observation for suspected exposure to other biological agents ruled out: Secondary | ICD-10-CM | POA: Diagnosis not present

## 2018-08-14 LAB — CBC WITH DIFFERENTIAL/PLATELET
Abs Immature Granulocytes: 0.04 10*3/uL (ref 0.00–0.07)
Basophils Absolute: 0 10*3/uL (ref 0.0–0.1)
Basophils Relative: 0 %
Eosinophils Absolute: 0 10*3/uL (ref 0.0–0.5)
Eosinophils Relative: 0 %
HCT: 42.1 % (ref 36.0–46.0)
Hemoglobin: 13.6 g/dL (ref 12.0–15.0)
Immature Granulocytes: 0 %
Lymphocytes Relative: 13 %
Lymphs Abs: 1.7 10*3/uL (ref 0.7–4.0)
MCH: 29.9 pg (ref 26.0–34.0)
MCHC: 32.3 g/dL (ref 30.0–36.0)
MCV: 92.5 fL (ref 80.0–100.0)
Monocytes Absolute: 0.7 10*3/uL (ref 0.1–1.0)
Monocytes Relative: 6 %
Neutro Abs: 10.1 10*3/uL — ABNORMAL HIGH (ref 1.7–7.7)
Neutrophils Relative %: 81 %
Platelets: 192 10*3/uL (ref 150–400)
RBC: 4.55 MIL/uL (ref 3.87–5.11)
RDW: 13.3 % (ref 11.5–15.5)
WBC: 12.5 10*3/uL — ABNORMAL HIGH (ref 4.0–10.5)
nRBC: 0 % (ref 0.0–0.2)

## 2018-08-14 LAB — COMPREHENSIVE METABOLIC PANEL
ALT: 21 U/L (ref 0–44)
AST: 28 U/L (ref 15–41)
Albumin: 4.1 g/dL (ref 3.5–5.0)
Alkaline Phosphatase: 70 U/L (ref 38–126)
Anion gap: 13 (ref 5–15)
BUN: 12 mg/dL (ref 8–23)
CO2: 23 mmol/L (ref 22–32)
Calcium: 9.4 mg/dL (ref 8.9–10.3)
Chloride: 102 mmol/L (ref 98–111)
Creatinine, Ser: 0.75 mg/dL (ref 0.44–1.00)
GFR calc Af Amer: >60 mL/min (ref >60)
GFR calc non Af Amer: >60 mL/min (ref >60)
Glucose, Bld: 167 mg/dL — ABNORMAL HIGH (ref 70–99)
Potassium: 3.6 mmol/L (ref 3.5–5.1)
Sodium: 138 mmol/L (ref 135–145)
Total Bilirubin: 0.6 mg/dL (ref 0.3–1.2)
Total Protein: 7.2 g/dL (ref 6.5–8.1)

## 2018-08-14 LAB — RAPID URINE DRUG SCREEN, HOSP PERFORMED
Amphetamines: NOT DETECTED
Barbiturates: NOT DETECTED
Benzodiazepines: NOT DETECTED
Cocaine: NOT DETECTED
Opiates: NOT DETECTED
Tetrahydrocannabinol: NOT DETECTED

## 2018-08-14 LAB — URINALYSIS, ROUTINE W REFLEX MICROSCOPIC
Bilirubin Urine: NEGATIVE
Glucose, UA: NEGATIVE mg/dL
Hgb urine dipstick: NEGATIVE
Ketones, ur: 5 mg/dL — AB
Leukocytes,Ua: NEGATIVE
Nitrite: NEGATIVE
Protein, ur: NEGATIVE mg/dL
Specific Gravity, Urine: 1.019 (ref 1.005–1.030)
pH: 5 (ref 5.0–8.0)

## 2018-08-14 LAB — CBG MONITORING, ED: Glucose-Capillary: 147 mg/dL — ABNORMAL HIGH (ref 70–99)

## 2018-08-14 LAB — ETHANOL: Alcohol, Ethyl (B): <10 mg/dL (ref <10)

## 2018-08-14 LAB — LACTIC ACID, PLASMA: Lactic Acid, Venous: 2.5 mmol/L (ref 0.5–1.9)

## 2018-08-14 MED ORDER — ACETAMINOPHEN 650 MG RE SUPP: 650.0000 mg | Freq: Four times a day (QID) | RECTAL | Status: DC | PRN

## 2018-08-14 MED ORDER — INSULIN ASPART 100 UNIT/ML ~~LOC~~ SOLN
0.0000 [IU] | Freq: Three times a day (TID) | SUBCUTANEOUS | Status: DC
  Administered 2018-08-16: 3 [IU] via SUBCUTANEOUS

## 2018-08-14 MED ORDER — ONDANSETRON HCL 4 MG/2ML IJ SOLN: 4.0000 mg | Freq: Four times a day (QID) | INTRAMUSCULAR | Status: DC | PRN

## 2018-08-14 MED ORDER — ENOXAPARIN SODIUM 40 MG/0.4ML ~~LOC~~ SOLN
40.0000 mg | SUBCUTANEOUS | Status: DC
  Administered 2018-08-15 – 2018-08-16 (×2): 40 mg via SUBCUTANEOUS
  Filled 2018-08-14 (×2): qty 0.4

## 2018-08-14 MED ORDER — ACETAMINOPHEN 325 MG PO TABS
650.0000 mg | ORAL_TABLET | Freq: Four times a day (QID) | ORAL | Status: DC | PRN
  Administered 2018-08-15: 02:00:00 650 mg via ORAL
  Filled 2018-08-14: qty 2

## 2018-08-14 MED ORDER — ONDANSETRON HCL 4 MG PO TABS: 4.0000 mg | ORAL_TABLET | Freq: Four times a day (QID) | ORAL | Status: DC | PRN

## 2018-08-14 NOTE — ED Provider Notes (Signed)
68 year old lady last known normal yesterday presents with confusion and altered mental status without obvious source.  Labs, urine, and CT reviewed without any acute abnormality Discussed with Dr. Alcario Drought will admit for further evaluation and treatment   Pattricia Boss, MD 08/14/18 2235

## 2018-08-14 NOTE — ED Notes (Signed)
ED TO INPATIENT HANDOFF REPORT  ED Nurse Name and Phone #: Maralyn SagoSarah [phone]  S Name/Age/Gender Linda SchleinLillie Arnold 68 y.o. female Room/Bed: 054C/054C  Code Status   Code Status: Full Code  Home/SNF/Other Home Patient oriented to: self, place, time and situation Is this baseline? No   Triage Complete: Triage complete  Chief Complaint confusion  Triage Note Pt here for confusion. Family talking to her on the phone two hours ago and noticed her to be talking out of her mind. Pt repeating information and asking repetitive questions about her son, if she has passed out, and why she is here. 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 No other deficits other than confusion per family. Pt answering orientation questions appropriately.    Allergies Allergies  Allergen Reactions  . Betadine [Povidone Iodine] Rash  . Lisinopril     Level of Care/Admitting Diagnosis ED Disposition    ED Disposition Condition Comment   Admit  [facility] [[id number]]  Level of Care: Med-Surg [16]  I expect the patient will be discharged within 24 hours: No (not a candidate for 5C-Observation unit)  Covid Evaluation: Screening Protocol (No Symptoms)  Diagnosis: Acute encephalopathy [914782][[id number]]  Admitting Physician: Hillary BowGARDNER, JARED M [id number]]  Attending Physician: Hillary BowGARDNER, JARED M [[id number]]  PT Class (Do Not Modify): Observation [104]  PT Acc Code (Do Not Modify): Observation [10022]       B Medical/Surgery History Past Medical History:  Diagnosis Date  . Arthritis 1999   knees  . Diabetes mellitus without complication (HCC) 2005  . Dysphagia   . Hyperlipidemia   . Hypertension    Past Surgical History:  Procedure Laterality Date  . CESAREAN SECTION    . TUBAL LIGATION       A IV Location/Drains/Wounds Patient Lines/Drains/Airways Status   Active Line/Drains/Airways    Name:   Placement date:   Placement time:   Site:   Days:   Peripheral IV 08/14/18 Left Antecubital    08/14/18    1920    Antecubital   less than 1          Intake/Output Last 24 hours No intake or output data in the 24 hours ending 08/14/18 2319  Labs/Imaging Results for orders placed or performed during the hospital encounter of 08/14/18 (from the past 48 hour(s))  CBG monitoring, ED     Status: Abnormal   Collection Time: 08/14/18  7:44 PM  Result Value Ref Range   Glucose-Capillary 147 (H) 70 - 99 mg/dL  Comprehensive metabolic panel     Status: Abnormal   Collection Time: 08/14/18  7:46 PM  Result Value Ref Range   Sodium 138 135 - 145 mmol/L   Potassium 3.6 3.5 - 5.1 mmol/L   Chloride 102 98 - 111 mmol/L   CO2 23 22 - 32 mmol/L   Glucose, Bld 167 (H) 70 - 99 mg/dL   BUN 12 8 - 23 mg/dL   Creatinine, Ser 1.300.75 0.44 - 1.00 mg/dL   Calcium 9.4 8.9 - 86.510.3 mg/dL   Total Protein 7.2 6.5 - 8.1 g/dL   Albumin 4.1 3.5 - 5.0 g/dL   AST 28 15 - 41 U/L   ALT 21 0 - 44 U/L   Alkaline Phosphatase 70 38 - 126 U/L   Total Bilirubin 0.6 0.3 - 1.2 mg/dL   GFR calc non Af Amer >60 >60 mL/min   GFR calc Af Amer >60 >60 mL/min   Anion gap 13 5 - 15  Comment: Performed at [facility], [place] [address] [place], [place] [address]  CBC with Differential     Status: Abnormal   Collection Time: 08/14/18  7:46 PM  Result Value Ref Range   WBC 12.5 (H) 4.0 - 10.5 K/uL   RBC 4.55 3.87 - 5.11 MIL/uL   Hemoglobin 13.6 12.0 - 15.0 g/dL   HCT 42.1 36.0 - 46.0 %   MCV 92.5 80.0 - 100.0 fL   MCH 29.9 26.0 - 34.0 pg   MCHC 32.3 30.0 - 36.0 g/dL   RDW 13.3 11.5 - 15.5 %   Platelets 192 150 - 400 K/uL   nRBC 0.0 0.0 - 0.2 %   Neutrophils Relative % 81 %   Neutro Abs 10.1 (H) 1.7 - 7.7 K/uL   Lymphocytes Relative 13 %   Lymphs Abs 1.7 0.7 - 4.0 K/uL   Monocytes Relative 6 %   Monocytes Absolute 0.7 0.1 - 1.0 K/uL   Eosinophils Relative 0 %   Eosinophils Absolute 0.0 0.0 - 0.5 K/uL   Basophils Relative 0 %   Basophils Absolute 0.0 0.0 - 0.1 K/uL   Immature Granulocytes 0 %   Abs  Immature Granulocytes 0.04 0.00 - 0.07 K/uL    Comment: Performed at [facility] [address] [place], [place] [address]  Ethanol     Status: None   Collection Time: 08/14/18  7:46 PM  Result Value Ref Range   Alcohol, Ethyl (B) <10 <10 mg/dL    Comment: (NOTE) Lowest detectable limit for serum alcohol is 10 mg/dL. For medical purposes only. Performed at [facility], [place] [address], [place], [place] [address]   Urinalysis, Routine w reflex microscopic     Status: Abnormal   Collection Time: 08/14/18  7:50 PM  Result Value Ref Range   Color, Urine YELLOW YELLOW   APPearance CLEAR CLEAR   Specific Gravity, Urine 1.019 1.005 - 1.030   pH 5.0 5.0 - 8.0   Glucose, UA NEGATIVE NEGATIVE mg/dL   Hgb urine dipstick NEGATIVE NEGATIVE   Bilirubin Urine NEGATIVE NEGATIVE   Ketones, ur 5 (A) NEGATIVE mg/dL   Protein, ur NEGATIVE NEGATIVE mg/dL   Nitrite NEGATIVE NEGATIVE   Leukocytes,Ua NEGATIVE NEGATIVE    Comment: Performed at [facility] [address] [place], [place] [address]  Urine rapid drug screen (hosp performed)     Status: None   Collection Time: 08/14/18  7:50 PM  Result Value Ref Range   Opiates NONE DETECTED NONE DETECTED   Cocaine NONE DETECTED NONE DETECTED   Benzodiazepines NONE DETECTED NONE DETECTED   Amphetamines NONE DETECTED NONE DETECTED   Tetrahydrocannabinol NONE DETECTED NONE DETECTED   Barbiturates NONE DETECTED NONE DETECTED    Comment: (NOTE) DRUG SCREEN FOR MEDICAL PURPOSES ONLY.  IF CONFIRMATION IS NEEDED FOR ANY PURPOSE, NOTIFY LAB WITHIN 5 DAYS. LOWEST DETECTABLE LIMITS FOR URINE DRUG SCREEN Drug Class                     Cutoff (ng/mL) Amphetamine and metabolites    1000 Barbiturate and metabolites    200 Benzodiazepine                 419 Tricyclics and metabolites     300 Opiates and metabolites        300 Cocaine and metabolites        300 THC  50 Performed at [facility], [address] [place], [place] [address]    Ct Head Wo Contrast  Result Date: 08/14/2018 CLINICAL DATA:  Initial evaluation for acute memory loss. EXAM: CT HEAD WITHOUT CONTRAST TECHNIQUE: Contiguous axial images were obtained from the base of the skull through the vertex without intravenous contrast. COMPARISON:  None available. FINDINGS: Brain: Cerebral volume within normal limits for patient age. No evidence for acute intracranial hemorrhage. No findings to suggest acute large vessel territory infarct. No mass lesion, midline shift, or mass effect. Ventricles are normal in size without evidence for hydrocephalus. No extra-axial fluid collection identified. Vascular: No hyperdense vessel identified. Skull: Scalp soft tissues demonstrate no acute abnormality. Calvarium intact. Sinuses/Orbits: Globes and orbital soft tissues within normal limits. Right maxillary sinus retention cyst. Paranasal sinuses are otherwise clear. No mastoid effusion. IMPRESSION: Normal head CT.  No acute intracranial abnormality identified. Electronically Signed   By: Rise MuBenjamin  McClintock M.D.   On: 08/14/2018 22:09    Pending Labs Unresulted Labs (From admission, onward)    Start     Ordered   08/15/18 0500  CBC  Tomorrow morning,   R     08/14/18 2237   08/15/18 0500  Comprehensive metabolic panel  Tomorrow morning,   R     08/14/18 2237   08/14/18 2244  Lactic acid, plasma  Now then every 2 hours,   STAT     08/14/18 2243   08/14/18 2237  HIV antibody (Routine Testing)  Once,   R     08/14/18 2237   08/14/18 2236  Ammonia  Once,   R     08/14/18 2235   08/14/18 2217  SARS Coronavirus 2 (CEPHEID - Performed in [facility] lab), Hosp Order  (Asymptomatic Patients Labs)  Once,   R    Question:  Rule Out  Answer:  Yes   08/14/18 2216          Vitals/Pain Today's Vitals   08/14/18 2215 08/14/18 2230 08/14/18 2252 08/14/18 2258  BP: 137/70 (!) 145/64 128/75   Pulse:   82   Resp:      Temp:      TempSrc:       SpO2:   99%   Weight:      Height:      PainSc:    0-No pain    Isolation Precautions No active isolations  Medications Medications  insulin aspart (novoLOG) injection 0-15 Units (has no administration in time range)  acetaminophen (TYLENOL) tablet 650 mg (has no administration in time range)    Or  acetaminophen (TYLENOL) suppository 650 mg (has no administration in time range)  ondansetron (ZOFRAN) tablet 4 mg (has no administration in time range)    Or  ondansetron (ZOFRAN) injection 4 mg (has no administration in time range)  enoxaparin (LOVENOX) injection 40 mg (has no administration in time range)    Mobility walks Moderate fall risk   Focused Assessments Neuro Assessment Handoff:  Swallow screen pass? Yes    NIH Stroke Scale ( + Modified Stroke Scale Criteria)  Interval: Initial Level of Consciousness (1a.)   : Alert, keenly responsive LOC Questions (1b. )   +: Answers both questions correctly LOC Commands (1c. )   + : Performs both tasks correctly Best Gaze (2. )  +: Normal Visual (3. )  +: No visual loss Facial Palsy (4. )    : Normal symmetrical movements Motor Arm, Left (5a. )   +:  No drift Motor Arm, Right (5b. )   +: No drift Motor Leg, Left (6a. )   +: No drift Motor Leg, Right (6b. )   +: No drift Limb Ataxia (7. ): Absent Sensory (8. )   +: Normal, no sensory loss Best Language (9. )   +: No aphasia Dysarthria (10. ): Normal Extinction/Inattention (11.)   +: No Abnormality Modified SS Total  +: 0 Complete NIHSS TOTAL: 0 Last date known well: 08/14/18 Last time known well: 1700 Neuro Assessment: Within Defined Limits Neuro Checks:   Initial (08/14/18 2253)  Last Documented NIHSS Modified Score: 0 (08/14/18 2253) Has TPA been given? No If patient is a Neuro Trauma and patient is going to OR before floor call report to 4N Charge nurse: [phone] or [phone]     R Recommendations: See Admitting Provider Note  Report given to:    Additional Notes:

## 2018-08-14 NOTE — ED Provider Notes (Signed)
[facility] EM[facility] Provider Note   CSN: [id number] Arrival date & time: 08/14/18  [id number]    History   Chief Complaint Chief Complaint  Patient presents with  . Altered Mental Status    HPI Jeanett SchleinLillie Worm is a 68 y.o. female.     Patient is a 68 year old female with past medical history of hypertension, diabetes, obesity, hypercholesterolemia who presents the emergency department via EMS for altered mental status.  Patient is very confused and is unsure why she is here or how she got here.  History is obtained from the patient's sister Christena Flake(Nellie Alston)  whom I talked with over the phone.  Patient's sister states that she was on the phone with the patient around 5:30 PM today and realize her sister was repeating her questions and having memory loss.  Patient sister states that the patient lives alone and has been quarantining herself for about 2 months.  The patient's sister states that she has not seen the patient in months but did speak to the patient over the phone around noon time yesterday and the patient seems to be acting her normal self.  Patient sister then went over to the patient's house to check on her and realized the patient was very confused and did not know what was going on.  This lasted for about 2 hours before the sister decided to call EMS.  Denies any slurred speech, facial asymmetry, extremity weakness.  Patient has no complaints of any pain or discomfort, fever or cough.  Patient is unsure of the month and is repeating her questions.     Past Medical History:  Diagnosis Date  . Arthritis 1999   knees  . Diabetes mellitus without complication (HCC) 2005  . Dysphagia   . Hyperlipidemia   . Hypertension     Patient Active Problem List   Diagnosis Date Noted  . Contact dermatitis 11/16/2012  . Osteoarthritis of right knee 10/29/2012  . Right knee pain 08/08/2012  . Hypertension 02/06/2012  . HEADACHE 06/09/2008  . DIABETES  MELLITUS, TYPE II, CONTROLLED, W/O COMPLICATIONS 12/25/2006  . OBESITY, MODERATE 12/25/2006  . 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 TROCHANTERIC BURSITIS, LEFT 12/25/2006  . HYPERCHOLESTEROLEMIA 11/16/2006    Past Surgical History:  Procedure Laterality Date  . CESAREAN SECTION    . TUBAL LIGATION       OB History   No obstetric history on file.      Home Medications    Prior to Admission medications   Medication Sig Start Date End Date Taking? Authorizing Provider  aspirin 81 MG tablet Take 81 mg by mouth daily.    [provider]  calcium citrate-vitamin D 200-200 MG-UNIT TABS Take 2 tablets by mouth daily.    [provider]  cetirizine (ZYRTEC) 10 MG tablet Take 1 tablet (10 mg total) by mouth daily. 02/27/17   Georgetta HaberBurky, Natalie B, NP  ibuprofen (ADVIL,MOTRIN) 600 MG tablet Take 1 tablet (600 mg total) by mouth every 6 (six) hours as needed for moderate pain. Limit to 3-5 days. 12/09/13   Horton, Mayer Maskerourtney F, MD  metFORMIN (GLUCOPHAGE) 500 MG tablet Take 500 mg by mouth daily.    [provider]  pravastatin (PRAVACHOL) 20 MG tablet Take 1 tablet (20 mg total) by mouth daily. 02/05/13   Tyrone NineGrunz, Ryan B, MD    Family History Family History  Problem Relation Age of Onset  . Diabetes Mother   . Diabetes Sister   . Hyperlipidemia Sister   .  Hypertension Sister   . Cancer Brother [id number]       Lung cancer x2 brothers  . Hypertension Brother     Social History Social History   Tobacco Use  . Smoking status: Never Smoker  . Smokeless tobacco: Never Used  Substance Use Topics  . Alcohol use: Not on file  . Drug use: No     Allergies   Betadine [povidone iodine] and Lisinopril   Review of Systems Review of Systems  Constitutional: Negative for appetite change, fatigue and fever.  HENT: Negative for congestion.   Eyes: Negative for photophobia and visual disturbance.  Respiratory: Negative for cough and shortness of breath.    Cardiovascular: Negative for chest pain.  Gastrointestinal: Negative for abdominal pain, diarrhea, nausea and vomiting.  Endocrine: Negative for polyuria.  Genitourinary: Negative for dysuria, flank pain and hematuria.  Musculoskeletal: Negative for arthralgias and back pain.  Skin: Negative for rash and wound.  Neurological: Negative for dizziness, light-headedness and headaches.  Psychiatric/Behavioral: Positive for confusion.     Physical Exam Updated Vital Signs BP 133/80   Pulse 87   Temp 98.3 F (36.8 C) (Oral)   Resp 15   Ht 5' 2.5" (1.588 m)   Wt 95.3 kg   SpO2 98%   BMI 37.80 kg/m   Physical Exam Vitals signs and nursing note reviewed.  Constitutional:      General: She is not in acute distress.    Appearance: She is well-developed. She is not ill-appearing, toxic-appearing or diaphoretic.  HENT:     Head: Normocephalic and atraumatic.     Nose: Nose normal.  Eyes:     Conjunctiva/sclera: Conjunctivae normal.     Pupils: Pupils are equal, round, and reactive to light.     Comments: Patient has chronic strabismus of the right eye  Neck:     Musculoskeletal: Neck supple.  Cardiovascular:     Rate and Rhythm: Normal rate and regular rhythm.     Heart sounds: No murmur.  Pulmonary:     Effort: Pulmonary effort is normal. No respiratory distress.     Breath sounds: Normal breath sounds.  Abdominal:     Palpations: Abdomen is soft.     Tenderness: There is no abdominal tenderness.  Skin:    General: Skin is warm and dry.     Comments: Patient was examined from head to toe and there are no signs of any injury, trauma, rashes or infections from head to toe  Neurological:     General: No focal deficit present.     Mental Status: She is alert. She is disoriented.     Cranial Nerves: No cranial nerve deficit, dysarthria or facial asymmetry.     Sensory: No sensory deficit.     Motor: No weakness, tremor or seizure activity.     Coordination: Coordination is  intact.     Comments: Patient has no dysarthria, aphasia, facial droop.  All cranial nerves are intact.  Patient unable to tell me the day of the week or the month.  Patient continually asks questions of how did she get here and where are her family members.  Psychiatric:        Mood and Affect: Mood normal.      ED Treatments / Results  Labs (all labs ordered are listed, but only abnormal results are displayed) Labs Reviewed  COMPREHENSIVE METABOLIC PANEL - Abnormal; Notable for the following components:      Result Value   Glucose, Bld 167 (*)  All other components within normal limits  CBC WITH DIFFERENTIAL/PLATELET - Abnormal; Notable for the following components:   WBC 12.5 (*)    Neutro Abs 10.1 (*)    All other components within normal limits  URINALYSIS, ROUTINE W REFLEX MICROSCOPIC - Abnormal; Notable for the following components:   Ketones, ur 5 (*)    All other components within normal limits  CBG MONITORING, ED - Abnormal; Notable for the following components:   Glucose-Capillary 147 (*)    All other components within normal limits  RAPID URINE DRUG SCREEN, HOSP PERFORMED  ETHANOL    EKG EKG Interpretation  Date/Time:  Tuesday August 14 2018 19:24:04 EDT Ventricular Rate:  86 PR Interval:    QRS Duration: 90 QT Interval:  359 QTC Calculation: 430 R Axis:   68 Text Interpretation:  Sinus rhythm No acute changes No significant change since last tracing Confirmed by Varney Biles [phone]) on 08/14/2018 7:45:00 PM   Radiology No results found.  Procedures Procedures (including critical care time)  Medications Ordered in ED Medications - No data to display   Initial Impression / Assessment and Plan / ED Course  I have reviewed the triage vital signs and the nursing notes.  Pertinent labs & imaging results that were available during my care of the patient were reviewed by me and considered in my medical decision making (see chart for details).  Clinical  Course as of Aug 14 2114  Tue Aug 14, 3935  [id number] 68 year old female with altered mental status, last known well greater than 24 hours ago.  She has a score of 1 on the modified NIH stroke scale.  Work-up including labs and CT of the head.  Her vital signs are normal and she has no signs of infection.  Patient's skin was examined head to toe with no signs of infection.  Patient will likely need to be admitted pending her head CT due to the degree of her confusion.  Patient care was passed off to Dr. Pattricia Boss due to change of shift   [KM]    Clinical Course User Index [KM] Alveria Apley, PA-C         Final Clinical Impressions(s) / ED Diagnoses   Final diagnoses:  None    ED Discharge Orders    None       Kristine Royal 08/14/18 2116    Pattricia Boss, MD 08/15/18 1529

## 2018-08-14 NOTE — ED Triage Notes (Signed)
Pt here for confusion. Family talking to her on the phone two hours ago and noticed her to be talking out of her mind. Pt repeating information and asking repetitive questions about her son, if she has passed out, and why she is here. 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 No other deficits other than confusion per family. Pt answering orientation questions appropriately.

## 2018-08-14 NOTE — H&P (Signed)
History and Physical    Linda SchleinLillie Federici WUJ:[id number] DOB: April 23, 1950 DOA: 08/14/2018  PCP: Gilda CreasePavelock, Richard M, MD  Patient coming from: Home  I have personally briefly reviewed patient's old medical records in [facility]  Chief Complaint: Confusion  HPI: Linda Arnold is a 68 y.o. female with medical history significant of DM2, HTN.  Patient LKW yesterday.  Patient has been at home for past 2 months quarantining herself due to COVID.  Patient's sister states that she was on the phone with the patient around 5:30 PM today and realized her sister was repeating her questions and having memory loss and confusion.  The patient's sister states that she has not seen the patient in months but did speak to the patient over the phone around noon time yesterday and the patient seems to be acting her normal self.  Patient sister then went over to the patient's house to check on her and realized the patient was very confused and did not know what was going on.  This lasted for about 2 hours before the sister decided to call EMS.  Denies any slurred speech, facial asymmetry, extremity weakness.  Patient has no complaints of any pain or discomfort, fever or cough.  Patient is unsure of the month and is repeating her questions.   ED Course: Work up in ED is negative for abnormality thus far.   Review of Systems: As per HPI otherwise 10 point review of systems negative.   Past Medical History:  Diagnosis Date  . Arthritis 1999   knees  . Diabetes mellitus without complication (HCC) 2005  . Dysphagia   . Hyperlipidemia   . Hypertension     Past Surgical History:  Procedure Laterality Date  . CESAREAN SECTION    . TUBAL LIGATION       reports that she has never smoked. She has never used smokeless tobacco. She reports that she does not use drugs. No history on file for alcohol.  Allergies  Allergen Reactions  . Betadine [Povidone Iodine] Rash  . Lisinopril     Family History  Problem  Relation Age of Onset  . Diabetes Mother   . Diabetes Sister   . Hyperlipidemia Sister   . Hypertension Sister   . Cancer Brother [id number]       Lung cancer x2 brothers  . Hypertension Brother      Prior to Admission medications   Medication Sig Start Date End Date Taking? Authorizing Provider  aspirin 81 MG tablet Take 81 mg by mouth daily.    [provider]  calcium citrate-vitamin D 200-200 MG-UNIT TABS Take 2 tablets by mouth daily.    [provider]  cetirizine (ZYRTEC) 10 MG tablet Take 1 tablet (10 mg total) by mouth daily. 02/27/17   Georgetta HaberBurky, Natalie B, NP  ibuprofen (ADVIL,MOTRIN) 600 MG tablet Take 1 tablet (600 mg total) by mouth every 6 (six) hours as needed for moderate pain. Limit to 3-5 days. 12/09/13   Horton, Mayer Maskerourtney F, MD  metFORMIN (GLUCOPHAGE) 500 MG tablet Take 500 mg by mouth daily.    [provider]  pravastatin (PRAVACHOL) 20 MG tablet Take 1 tablet (20 mg total) by mouth daily. 02/05/13   Tyrone NineGrunz, Ryan B, MD    Physical Exam: Vitals:   08/14/18 2030 08/14/18 2215 08/14/18 2230 08/14/18 2252  BP: 133/80 137/70 (!) 145/64 128/75  Pulse: 87   82  Resp: 15     Temp:      TempSrc:  SpO2: 98%   99%  Weight:      Height:        Constitutional: NAD, calm, comfortable Eyes: Chronic strabismus of right eye ENMT: Mucous membranes are moist. Posterior pharynx clear of any exudate or lesions.Normal dentition.  Neck: normal, supple, no masses, no thyromegaly Respiratory: clear to auscultation bilaterally, no wheezing, no crackles. Normal respiratory effort. No accessory muscle use.  Cardiovascular: Regular rate and rhythm, no murmurs / rubs / gallops. No extremity edema. 2+ pedal pulses. No carotid bruits.  Abdomen: no tenderness, no masses palpated. No hepatosplenomegaly. Bowel sounds positive.  Musculoskeletal: no clubbing / cyanosis. No joint deformity upper and lower extremities. Good ROM, no contractures. Normal muscle tone.  Skin: no  rashes, lesions, ulcers. No induration Neurologic: CN 2-12 grossly intact. Sensation intact, DTR normal. Strength 5/5 in all 4.  Psychiatric: Not oriented to time, repeating questions   Labs on Admission: I have personally reviewed following labs and imaging studies  CBC: Recent Labs  Lab 08/14/18 1946  WBC 12.5*  NEUTROABS 10.1*  HGB 13.6  HCT 42.1  MCV 92.5  PLT 192   Basic Metabolic Panel: Recent Labs  Lab 08/14/18 1946  NA 138  K 3.6  CL 102  CO2 23  GLUCOSE 167*  BUN 12  CREATININE 0.75  CALCIUM 9.4   GFR: Estimated Creatinine Clearance: 72.2 mL/min (by C-G formula based on SCr of 0.75 mg/dL). Liver Function Tests: Recent Labs  Lab 08/14/18 1946  AST 28  ALT 21  ALKPHOS 70  BILITOT 0.6  PROT 7.2  ALBUMIN 4.1   No results for input(s): LIPASE, AMYLASE in the last 168 hours. No results for input(s): AMMONIA in the last 168 hours. Coagulation Profile: No results for input(s): INR, PROTIME in the last 168 hours. Cardiac Enzymes: No results for input(s): CKTOTAL, CKMB, CKMBINDEX, TROPONINI in the last 168 hours. BNP (last 3 results) No results for input(s): PROBNP in the last 8760 hours. HbA1C: No results for input(s): HGBA1C in the last 72 hours. CBG: Recent Labs  Lab 08/14/18 1944  GLUCAP 147*   Lipid Profile: No results for input(s): CHOL, HDL, LDLCALC, TRIG, CHOLHDL, LDLDIRECT in the last 72 hours. Thyroid Function Tests: No results for input(s): TSH, T4TOTAL, FREET4, T3FREE, THYROIDAB in the last 72 hours. Anemia Panel: No results for input(s): VITAMINB12, FOLATE, FERRITIN, TIBC, IRON, RETICCTPCT in the last 72 hours. Urine analysis:    Component Value Date/Time   COLORURINE YELLOW 08/14/2018 1950   APPEARANCEUR CLEAR 08/14/2018 1950   LABSPEC 1.019 08/14/2018 1950   PHURINE 5.0 08/14/2018 1950   GLUCOSEU NEGATIVE 08/14/2018 1950   HGBUR NEGATIVE 08/14/2018 1950   BILIRUBINUR NEGATIVE 08/14/2018 1950   KETONESUR 5 (A) 08/14/2018 1950    PROTEINUR NEGATIVE 08/14/2018 1950   UROBILINOGEN 0.2 11/15/2017 1647   NITRITE NEGATIVE 08/14/2018 1950   LEUKOCYTESUR NEGATIVE 08/14/2018 1950    Radiological Exams on Admission: Ct Head Wo Contrast  Result Date: 08/14/2018 CLINICAL DATA:  Initial evaluation for acute memory loss. EXAM: CT HEAD WITHOUT CONTRAST TECHNIQUE: Contiguous axial images were obtained from the base of the skull through the vertex without intravenous contrast. COMPARISON:  None available. FINDINGS: Brain: Cerebral volume within normal limits for patient age. No evidence for acute intracranial hemorrhage. No findings to suggest acute large vessel territory infarct. No mass lesion, midline shift, or mass effect. Ventricles are normal in size without evidence for hydrocephalus. No extra-axial fluid collection identified. Vascular: No hyperdense vessel identified. Skull: Scalp soft tissues demonstrate  no acute abnormality. Calvarium intact. Sinuses/Orbits: Globes and orbital soft tissues within normal limits. Right maxillary sinus retention cyst. Paranasal sinuses are otherwise clear. No mastoid effusion. IMPRESSION: Normal head CT.  No acute intracranial abnormality identified. Electronically Signed   By: Jeannine Boga M.D.   On: 08/14/2018 22:09    EKG: Independently reviewed.  Assessment/Plan Principal Problem:   Acute encephalopathy Active Problems:   DM2 (diabetes mellitus, type 2) ([place])    1. Acute encephalopathy - 1. unclear cause, work up neg thus far 2. WBC 12.5 and urinary frequency in ED are the only 2 things that stand out to me thus far, UA negative however 3. Check ammonia, lactate, CXR, and COVID testing 4. Repeat CBC/BMP in AM 5. Next step will be MRI brain if work up remains neg 2. DM2 - 1. Sensitive SSI AC 2. Hold metformin  DVT prophylaxis: Lovenox Code Status: Full Family Communication: No family in room Disposition Plan: TBD Consults called: None Admission status: Place in 24    GARDNER, [place] Hospitalists  How to contact the [facility] Attending or Consulting provider [place] or covering provider during after hours [place], for this patient?  1. Check the care team in [facility] and look for a) attending/consulting [facility] provider listed and b) the [facility] team listed 2. Log into [email or web address]  Amion Physician Scheduling and messaging for groups and whole hospitals  On call and physician scheduling software for group practices, residents, hospitalists and other medical providers for call, clinic, rotation and shift schedules. OnCall [employer] is a hospital-wide system for scheduling doctors and paging doctors on call. EasyPlot is for scientific plotting and data analysis.  [email or web address]  and use [place]'s universal password to access. If you do not have the password, please contact the hospital operator.  3. Locate the [facility] provider you are looking for under [facility] and page to a number that you can be directly reached. 4. If you still have difficulty reaching the provider, please page the [facility] (Director on Call) for the Hospitalists listed on amion for assistance.  08/14/2018, 10:57 PM

## 2018-08-14 NOTE — ED Notes (Signed)
Updated patient's niece Hassan Rowan, pt speaking with Hassan Rowan on the phone as well at this time.

## 2018-08-15 ENCOUNTER — Inpatient Hospital Stay (HOSPITAL_COMMUNITY): Payer: Medicare HMO

## 2018-08-15 ENCOUNTER — Observation Stay (HOSPITAL_COMMUNITY): Payer: Medicare HMO

## 2018-08-15 ENCOUNTER — Encounter (HOSPITAL_COMMUNITY): Payer: Self-pay

## 2018-08-15 DIAGNOSIS — E669 Obesity, unspecified: Secondary | ICD-10-CM | POA: Diagnosis present

## 2018-08-15 DIAGNOSIS — G9340 Encephalopathy, unspecified: Secondary | ICD-10-CM | POA: Diagnosis not present

## 2018-08-15 DIAGNOSIS — G9341 Metabolic encephalopathy: Secondary | ICD-10-CM | POA: Diagnosis present

## 2018-08-15 DIAGNOSIS — I10 Essential (primary) hypertension: Secondary | ICD-10-CM | POA: Diagnosis present

## 2018-08-15 DIAGNOSIS — E872 Acidosis, unspecified: Secondary | ICD-10-CM

## 2018-08-15 DIAGNOSIS — Z791 Long term (current) use of non-steroidal anti-inflammatories (NSAID): Secondary | ICD-10-CM | POA: Diagnosis not present

## 2018-08-15 DIAGNOSIS — Z1159 Encounter for screening for other viral diseases: Secondary | ICD-10-CM | POA: Diagnosis not present

## 2018-08-15 DIAGNOSIS — Z7982 Long term (current) use of aspirin: Secondary | ICD-10-CM | POA: Diagnosis not present

## 2018-08-15 DIAGNOSIS — D72828 Other elevated white blood cell count: Secondary | ICD-10-CM | POA: Diagnosis not present

## 2018-08-15 DIAGNOSIS — Z6837 Body mass index (BMI) 37.0-37.9, adult: Secondary | ICD-10-CM | POA: Diagnosis not present

## 2018-08-15 DIAGNOSIS — E785 Hyperlipidemia, unspecified: Secondary | ICD-10-CM | POA: Diagnosis present

## 2018-08-15 DIAGNOSIS — Z833 Family history of diabetes mellitus: Secondary | ICD-10-CM | POA: Diagnosis not present

## 2018-08-15 DIAGNOSIS — I639 Cerebral infarction, unspecified: Secondary | ICD-10-CM | POA: Diagnosis not present

## 2018-08-15 DIAGNOSIS — G454 Transient global amnesia: Secondary | ICD-10-CM

## 2018-08-15 DIAGNOSIS — R4182 Altered mental status, unspecified: Secondary | ICD-10-CM | POA: Diagnosis present

## 2018-08-15 DIAGNOSIS — E119 Type 2 diabetes mellitus without complications: Secondary | ICD-10-CM | POA: Diagnosis present

## 2018-08-15 DIAGNOSIS — D72829 Elevated white blood cell count, unspecified: Secondary | ICD-10-CM

## 2018-08-15 DIAGNOSIS — Z79899 Other long term (current) drug therapy: Secondary | ICD-10-CM | POA: Diagnosis not present

## 2018-08-15 DIAGNOSIS — Z7984 Long term (current) use of oral hypoglycemic drugs: Secondary | ICD-10-CM | POA: Diagnosis not present

## 2018-08-15 LAB — COMPREHENSIVE METABOLIC PANEL
ALT: 19 U/L (ref 0–44)
AST: 22 U/L (ref 15–41)
Albumin: 3.6 g/dL (ref 3.5–5.0)
Alkaline Phosphatase: 59 U/L (ref 38–126)
Anion gap: 10 (ref 5–15)
BUN: 10 mg/dL (ref 8–23)
CO2: 24 mmol/L (ref 22–32)
Calcium: 9.3 mg/dL (ref 8.9–10.3)
Chloride: 106 mmol/L (ref 98–111)
Creatinine, Ser: 0.71 mg/dL (ref 0.44–1.00)
GFR calc Af Amer: >60 mL/min (ref >60)
GFR calc non Af Amer: >60 mL/min (ref >60)
Glucose, Bld: 132 mg/dL — ABNORMAL HIGH (ref 70–99)
Potassium: 3.8 mmol/L (ref 3.5–5.1)
Sodium: 140 mmol/L (ref 135–145)
Total Bilirubin: 0.9 mg/dL (ref 0.3–1.2)
Total Protein: 6.5 g/dL (ref 6.5–8.1)

## 2018-08-15 LAB — GLUCOSE, CAPILLARY
Glucose-Capillary: 100 mg/dL — ABNORMAL HIGH (ref 70–99)
Glucose-Capillary: 107 mg/dL — ABNORMAL HIGH (ref 70–99)
Glucose-Capillary: 113 mg/dL — ABNORMAL HIGH (ref 70–99)
Glucose-Capillary: 138 mg/dL — ABNORMAL HIGH (ref 70–99)
Glucose-Capillary: 168 mg/dL — ABNORMAL HIGH (ref 70–99)
Glucose-Capillary: 83 mg/dL (ref 70–99)

## 2018-08-15 LAB — CBC
HCT: 36.7 % (ref 36.0–46.0)
Hemoglobin: 12.5 g/dL (ref 12.0–15.0)
MCH: 30.7 pg (ref 26.0–34.0)
MCHC: 34.1 g/dL (ref 30.0–36.0)
MCV: 90.2 fL (ref 80.0–100.0)
Platelets: 168 10*3/uL (ref 150–400)
RBC: 4.07 MIL/uL (ref 3.87–5.11)
RDW: 13.2 % (ref 11.5–15.5)
WBC: 10.7 10*3/uL — ABNORMAL HIGH (ref 4.0–10.5)
nRBC: 0 % (ref 0.0–0.2)

## 2018-08-15 LAB — ECHOCARDIOGRAM COMPLETE
Height: 62.5 in
Weight: 3360 oz

## 2018-08-15 LAB — SARS CORONAVIRUS 2: SARS Coronavirus 2: NOT DETECTED

## 2018-08-15 LAB — AMMONIA: Ammonia: 20 umol/L (ref 9–35)

## 2018-08-15 LAB — HIV ANTIBODY (ROUTINE TESTING W REFLEX): HIV Screen 4th Generation wRfx: NONREACTIVE

## 2018-08-15 LAB — LACTIC ACID, PLASMA: Lactic Acid, Venous: 1.6 mmol/L (ref 0.5–1.9)

## 2018-08-15 MED ORDER — SODIUM CHLORIDE 0.9 % IV BOLUS
500.0000 mL | Freq: Once | INTRAVENOUS | Status: AC
  Administered 2018-08-15: 02:00:00 500 mL via INTRAVENOUS

## 2018-08-15 MED ORDER — IOHEXOL 350 MG/ML SOLN
75.0000 mL | Freq: Once | INTRAVENOUS | Status: AC | PRN
  Administered 2018-08-15: 75 mL via INTRAVENOUS

## 2018-08-15 NOTE — Procedures (Signed)
History: 68 year old female being evaluated for TGA  Sedation: None  Technique: This is a 21 channel routine scalp EEG performed at the bedside with bipolar and monopolar montages arranged in accordance to the international 10/20 system of electrode placement. One channel was dedicated to EKG recording.    Background: The background consists of intermixed alpha and beta activities. There is a well defined posterior dominant rhythm of 9 hz that attenuates with eye opening. Sleep is recorded with normal appearing structures.   Photic stimulation: Physiologic driving is not performed  EEG Abnormalities: None  Clinical Interpretation: This normal EEG is recorded in the waking and sleep state. There was no seizure or seizure predisposition recorded on this study. Please note that lack of epileptiform activity on EEG does not preclude the possibility of epilepsy.   Roland Rack, MD [facility] [phone]  If 7pm- 7am, please page neurology on call as listed in [place].

## 2018-08-15 NOTE — Progress Notes (Signed)
  Echocardiogram 2D Echocardiogram has been performed.  Linda Arnold 08/15/2018, 5:17 PM

## 2018-08-15 NOTE — Consult Note (Addendum)
Neurology Consultation  Reason for Consult: Punctate signal on DWI of MRI Referring Physician: Roda ShuttersXu  History is obtained from: Patient and sister  HPI: Linda Arnold is a 68 y.o. female with history of hypertension, hyperlipidemia, diabetes.  Patient recalls yesterday watching a show and then starting to watch a funeral on TV approximately 11 AM.  She does not recall any other events until later that evening.  Her sister apparently called her during that period of time and noted that she was asking the same question repeatedly over and over.  This got her sister very worried, thus she drove over to pick up her sister and bring her home.  She continued to asked the same question repeatedly even though she was told the answer.  At that point she was brought to the hospital.  She does not recall any of the events but is back to her baseline at this time.    LKW: 11 AM on 08/14/2018 tpa given?: no, out of window back to baseline Premorbid modified Rankin scale (mRS):0 NIH stroke scale 0  ROS: A 14 point ROS was performed and is negative except as noted in the HPI.  Past Medical History:  Diagnosis Date  . Arthritis 1999   knees  . Diabetes mellitus without complication (HCC) 2005  . Dysphagia   . Hyperlipidemia   . Hypertension      Family History  Problem Relation Age of Onset  . Diabetes Mother   . Diabetes Sister   . Hyperlipidemia Sister   . Hypertension Sister   . Cancer Brother [phone]       Lung cancer x2 brothers  . Hypertension Brother     Social History:   reports that she has never smoked. She has never used smokeless tobacco. She reports that she does not use drugs. No history on file for alcohol.  Medications  Current Facility-Administered Medications:  .  acetaminophen (TYLENOL) tablet 650 mg, 650 mg, Oral, Q6H PRN, 650 mg at 08/15/18 0136 **OR** acetaminophen (TYLENOL) suppository 650 mg, 650 mg, Rectal, Q6H PRN, Hillary BowGardner, Jared M, DO .  enoxaparin (LOVENOX) injection  40 mg, 40 mg, Subcutaneous, Q24H, Gardner, Jared M, DO, 40 mg at 08/15/18 [id number] .  insulin aspart (novoLOG) injection 0-15 Units, 0-15 Units, Subcutaneous, TID WC, Gardner, Jared M, DO .  ondansetron (ZOFRAN) tablet 4 mg, 4 mg, Oral, Q6H PRN **OR** ondansetron (ZOFRAN) injection 4 mg, 4 mg, Intravenous, Q6H PRN, Hillary BowGardner, Jared M, DO   Exam: Current vital signs: BP 104/63 (BP Location: Left Arm)   Pulse 76   Temp 98.2 F (36.8 C) (Oral)   Resp 18   Ht 5' 2.5" (1.588 m)   Wt 95.3 kg   SpO2 99%   BMI 37.80 kg/m  Vital signs in last 24 hours: Temp:  [98.2 F (36.8 C)-99.7 F (37.6 C)] 98.2 F (36.8 C) (06/10 1316) Pulse Rate:  [76-90] 76 (06/10 1316) Resp:  [15-18] 18 (06/10 1316) BP: (73-145)/(46-82) 104/63 (06/10 1316) SpO2:  [98 %-100 %] 99 % (06/10 1316) Weight:  [95.3 kg] 95.3 kg (06/09 1918)  Physical Exam  Constitutional: Appears well-developed and well-nourished.  Psych: Affect appropriate to situation Eyes: No scleral injection HENT: No OP obstrucion Head: Normocephalic.  Cardiovascular: Normal rate and regular rhythm.  Respiratory: Effort normal, non-labored breathing GI: Soft.  No distension. There is no tenderness.  Skin: WDI  Neuro: Mental Status: Patient is awake, alert, oriented to person, place, month, year, and situation. Patient does not recall any  of event  Cranial Nerves: II: Visual Fields are full.  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 Pupils equal, round and reactive to light V: Facial sensation is symmetric to temperature VII: Facial movement is symmetric.  VIII: hearing is intact to voice X: Palat elevates symmetrically XI: Shoulder shrug is symmetric. XII: tongue is midline without atrophy or fasciculations.  Motor: Tone is normal. Bulk is normal. 5/5 strength was present in all four extremities.  Sensory: Sensation is symmetric to light touch and  temperature in the arms and legs. Deep Tendon Reflexes: 2+ and symmetric in the biceps and 1+ patellae.  Plantars: Toes are downgoing bilaterally.  Cerebellar: FNF and HKS are intact bilaterally  Labs I have reviewed labs in epic and the results pertinent to this consultation are:   CBC    Component Value Date/Time   WBC 10.7 (H) 08/15/2018 0002   RBC 4.07 08/15/2018 0002   HGB 12.5 08/15/2018 0002   HCT 36.7 08/15/2018 0002   PLT 168 08/15/2018 0002   MCV 90.2 08/15/2018 0002   MCV 93.1 12/09/2013 1330   MCH 30.7 08/15/2018 0002   MCHC 34.1 08/15/2018 0002   RDW 13.2 08/15/2018 0002   LYMPHSABS 1.7 08/14/2018 1946   MONOABS 0.7 08/14/2018 1946   EOSABS 0.0 08/14/2018 1946   BASOSABS 0.0 08/14/2018 1946    CMP     Component Value Date/Time   NA 140 08/15/2018 0002   K 3.8 08/15/2018 0002   CL 106 08/15/2018 0002   CO2 24 08/15/2018 0002   GLUCOSE 132 (H) 08/15/2018 0002   BUN 10 08/15/2018 0002   CREATININE 0.71 08/15/2018 0002   CREATININE 0.76 02/18/2013 0830   CALCIUM 9.3 08/15/2018 0002   PROT 6.5 08/15/2018 0002   ALBUMIN 3.6 08/15/2018 0002   AST 22 08/15/2018 0002   ALT 19 08/15/2018 0002   ALKPHOS 59 08/15/2018 0002   BILITOT 0.9 08/15/2018 0002   GFRNONAA >60 08/15/2018 0002   GFRAA >60 08/15/2018 0002    Lipid Panel     Component Value Date/Time   CHOL 172 02/18/2013 0830   TRIG 62 02/18/2013 0830   HDL 53 02/18/2013 0830   CHOLHDL 3.2 02/18/2013 0830   VLDL 12 02/18/2013 0830   LDLCALC 107 (H) 02/18/2013 0830   LDLDIRECT 112 (H) 10/29/2012 1435     Imaging I have reviewed the images obtained:  CT-scan of the brain-normal head CT  MRI examination of the brain- punctate acute/subacute infarct versus artifact in the right     Assessment:  This is a 68 year old female with a period of amnesia which included symptoms of repeating the same question multiple times even though she was told answer.  From the history obtained from the  patient and from her sister this sounds very much like TGA however given the punctate possible artifact versus infarct patient would benefit from a EEG and CTA of head and neck    Recommendations: - CTA of head and neck -EEG   Felicie MornDavid Smith PA-C [facility] [phone]  M-F  (9:00 am- 5:00 PM)  08/15/2018, 2:53 PM    I have seen the patient and reviewed the above note.  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  I think that the finding on MRI is artifactual, and even if it was real would be incidental.  I think it  is reasonable to do a CTA head and neck and if there was a tight stenosis on that side, then could consider treating is real, but I think most likely it is artifactual.  For her TGA, an EEG would be reasonable as is negative, then no further work-up following this.  Roland Rack, MD [facility] [phone]  If 7pm- 7am, please page neurology on call as listed in [place].

## 2018-08-15 NOTE — Progress Notes (Signed)
CRITICAL VALUE ALERT  Critical Value:  Lactic Acid: 2.7  Date & Time Notied:  08/15/2018 at 0001  Provider Notified: Lamar Blinks, NP   Orders Received/Actions taken: Waiting for orders.

## 2018-08-15 NOTE — Progress Notes (Signed)
EEG complete - results pending 

## 2018-08-15 NOTE — Progress Notes (Addendum)
PROGRESS NOTE  Linda SchleinLillie Arnold ZOX:[id number] DOB: 03/17/1950 DOA: 08/14/2018 PCP: Gilda CreasePavelock, Richard M, MD  HPI/Recap of past 24 hours:  She reports confusion is improving, but not quit back to baseline yet, she could not remember what has happened  Assessment/Plan: Principal Problem:   Acute encephalopathy Active Problems:   DM2 (diabetes mellitus, type 2) (HCC)   Acute metabolic encephalopathy -she was very confused upon arrival to the ED, this am she is improving, but not back to baseline yet, she could not remember the event -ct head no acute findings, ua /uds unremarkable, ammonia unremarkable -Hold home meds oxybutynin for now --MRI concerns for acute CVA, neurology consult, will get EEG  Consider refer for holter monitor for syncope, she can not remember if she has passed out or not, she lives by herself. troponin negative, ekg no acute findings, echo cardiogram pending    leukocytosis wbc 12.5 and lactic acidosis lactic acid -ua/cxr unremarkable, she does not have fever -she received 500cc fluid bolus on presentation, repeat labs has improved Hold metfromin, hold maxzide  noninsulin dependent dm2 -hold metformin, on ssi for now  HLD; continue statin    Code Status: full  Family Communication: patient   Disposition Plan: home tomorrow if patient back to baseline and clears by neurology   Consultants:  neurology  Procedures:  none  Antibiotics:  none   Objective: BP 104/63 (BP Location: Left Arm)    Pulse 76    Temp 98.2 F (36.8 C) (Oral)    Resp 18    Ht 5' 2.5" (1.588 m)    Wt 95.3 kg    SpO2 99%    BMI 37.80 kg/m   Intake/Output Summary (Last 24 hours) at 08/15/2018 1414 Last data filed at 08/15/2018 0400 Gross per 24 hour  Intake 504.26 ml  Output --  Net 504.26 ml   Filed Weights   08/14/18 1918  Weight: 95.3 kg    Exam: Patient is examined daily including today on 08/15/2018, exams remain the same as of yesterday except that has  changed    General:  NAD  Cardiovascular: RRR  Respiratory: CTABL  Abdomen: Soft/ND/NT, positive BS  Musculoskeletal: No Edema  Neuro: alert, oriented , slow in answering questions  Data Reviewed: Basic Metabolic Panel: Recent Labs  Lab 08/14/18 1946 08/15/18 0002  NA 138 140  K 3.6 3.8  CL 102 106  CO2 23 24  GLUCOSE 167* 132*  BUN 12 10  CREATININE 0.75 0.71  CALCIUM 9.4 9.3   Liver Function Tests: Recent Labs  Lab 08/14/18 1946 08/15/18 0002  AST 28 22  ALT 21 19  ALKPHOS 70 59  BILITOT 0.6 0.9  PROT 7.2 6.5  ALBUMIN 4.1 3.6   No results for input(s): LIPASE, AMYLASE in the last 168 hours. Recent Labs  Lab 08/14/18 2249  AMMONIA 20   CBC: Recent Labs  Lab 08/14/18 1946 08/15/18 0002  WBC 12.5* 10.7*  NEUTROABS 10.1*  --   HGB 13.6 12.5  HCT 42.1 36.7  MCV 92.5 90.2  PLT 192 168   Cardiac Enzymes:   No results for input(s): CKTOTAL, CKMB, CKMBINDEX, TROPONINI in the last 168 hours. BNP (last 3 results) No results for input(s): BNP in the last 8760 hours.  ProBNP (last 3 results) No results for input(s): PROBNP in the last 8760 hours.  CBG: Recent Labs  Lab 08/14/18 1944 08/15/18 0009 08/15/18 0754 08/15/18 1251 08/15/18 1319  GLUCAP 147* 113* 83 100* 168*  Recent Results (from the past 240 hour(s))  SARS Coronavirus 2     Status: None   Collection Time: 08/14/18 10:49 PM  Result Value Ref Range Status   SARS Coronavirus 2 NOT DETECTED NOT DETECTED Final    Comment: (NOTE) SARS-CoV-2 target nucleic acids are NOT DETECTED. The SARS-CoV-2 RNA is generally detectable in upper and lower respiratory specimens during the acute phase of infection.  Negative  results do not preclude SARS-CoV-2 infection, do not rule out co-infections with other pathogens, and should not be used as the sole basis for treatment or other patient management decisions.  Negative results must be combined with clinical observations, patient history, and  epidemiological information. The expected result is Not Detected. Fact Sheet for Patients: http://www.biofiredefense.com/wp-content/uploads/2020/03/BIOFIRE-COVID -19-patients.pdf Fact Sheet for Healthcare Providers: http://www.biofiredefense.com/wp-content/uploads/2020/03/BIOFIRE-COVID -19-hcp.pdf This test is not yet approved or cleared by the [place] and  has been authorized for detection and/or diagnosis of SARS-CoV-2 by FDA under an Emergency Use Authorization (EUA).  This EUA will remain in effec t (meaning this test can be used) for the duration of  the COVID-19 declaration under Section 564(b)(1) of the Act, 21 U.S.C. section 360bbb-3(b)(1), unless the authorization is terminated or revoked sooner. Performed at [facility], [place] [address], [place], [place] [address]      Studies: Ct Head Wo Contrast  Result Date: 08/14/2018 CLINICAL DATA:  Initial evaluation for acute memory loss. EXAM: CT HEAD WITHOUT CONTRAST TECHNIQUE: Contiguous axial images were obtained from the base of the skull through the vertex without intravenous contrast. COMPARISON:  None available. FINDINGS: Brain: Cerebral volume within normal limits for patient age. No evidence for acute intracranial hemorrhage. No findings to suggest acute large vessel territory infarct. No mass lesion, midline shift, or mass effect. Ventricles are normal in size without evidence for hydrocephalus. No extra-axial fluid collection identified. Vascular: No hyperdense vessel identified. Skull: Scalp soft tissues demonstrate no acute abnormality. Calvarium intact. Sinuses/Orbits: Globes and orbital soft tissues within normal limits. Right maxillary sinus retention cyst. Paranasal sinuses are otherwise clear. No mastoid effusion. IMPRESSION: Normal head CT.  No acute intracranial abnormality identified. Electronically Signed   By: Jeannine Boga M.D.   On: 08/14/2018 22:09   Mr Brain Wo Contrast  Result Date:  08/15/2018 CLINICAL DATA:  Encephalopathy. Acute memory loss/confusion yesterday. EXAM: MRI HEAD WITHOUT CONTRAST TECHNIQUE: Multiplanar, multiecho pulse sequences of the brain and surrounding structures were obtained without intravenous contrast. COMPARISON:  Head CT 08/14/2018 FINDINGS: Brain: There is a 2 mm focus of mildly increased trace diffusion weighted signal involving cortex of the right precentral gyrus (series 3, image 36) on axial imaging. Assessment for reduced ADC is limited by small size, and assessment on coronal imaging is limited by slice selection. Single punctate foci of mildly increased trace diffusion signal in the posteromedial temporal lobes bilaterally are favored to be artifactual given bilaterality. No acute large territory infarct, intracranial hemorrhage, mass, midline shift, or extra-axial fluid collection is identified. A single subcentimeter focus of subcortical T2/FLAIR hyperintensity in the right frontal operculum is not considered abnormal for age. Mild cerebral atrophy is within normal limits for age. Vascular: Major intracranial vascular flow voids are preserved. Skull and upper cervical spine: No suspicious marrow lesion. Sinuses/Orbits: Unremarkable orbits. Small sphenoid sinus mucous retention cysts. Clear mastoid air cells. Other: None. IMPRESSION: 1. Punctate acute/subacute infarct versus artifact in the right precentral gyrus. 2. Otherwise unremarkable appearance of the brain for age. Electronically Signed   By: Logan Bores  M.D.   On: 08/15/2018 13:05   Dg Chest Port 1 View  Result Date: 08/15/2018 CLINICAL DATA:  Altered mental status EXAM: PORTABLE CHEST 1 VIEW COMPARISON:  12/09/2013 FINDINGS: The heart size is enlarged. There is no pneumothorax. No large pleural effusion. There is some elevation of the right hemidiaphragm. There is no acute osseous abnormality. There is no focal area of consolidation. There is likely some atelectasis near the right costophrenic  angle. IMPRESSION: No active disease. Electronically Signed   By: Katherine Mantlehristopher  Green M.D.   On: 08/15/2018 00:49    Scheduled Meds:  enoxaparin (LOVENOX) injection  40 mg Subcutaneous Q24H   insulin aspart  0-15 Units Subcutaneous TID WC    Continuous Infusions:   Time spent: 35mins I have personally reviewed and interpreted on  08/15/2018 daily labs,  imagings as discussed above under date review session and assessment and plans.  I reviewed all nursing notes, pharmacy notes, consultant notes,  vitals, pertinent old records  I have discussed plan of care as described above with RN , patient  on 08/15/2018   Albertine GratesFang Makaleigh Reinard MD, PhD  [facility] Pager [phone]. If 7PM-7AM, please contact night-coverage at [email or web address], password [email or web address] 08/15/2018, 2:14 PM  LOS: 0 days

## 2018-08-15 NOTE — Progress Notes (Signed)
Responded to spiritual care consult. PT was alert and talking on phone. PT was bright eyed and talkative. She mentioned her confusion upon her arrival yesterday, but stated that her memory is coming back. I offered her a brief overview of the AD. Pt stated that she was going to let her niece look it over with her. I also offered her spiritual care with words of encouragement, empathic listening, and prayer. I left a copy. PT was thankful for the [place] visit.  Chaplain Fidel Levy  [phone]

## 2018-08-16 DIAGNOSIS — D72829 Elevated white blood cell count, unspecified: Secondary | ICD-10-CM

## 2018-08-16 LAB — CBC WITH DIFFERENTIAL/PLATELET
Abs Immature Granulocytes: 0.01 10*3/uL (ref 0.00–0.07)
Basophils Absolute: 0 10*3/uL (ref 0.0–0.1)
Basophils Relative: 0 %
Eosinophils Absolute: 0.1 10*3/uL (ref 0.0–0.5)
Eosinophils Relative: 1 %
HCT: 36.8 % (ref 36.0–46.0)
Hemoglobin: 12.1 g/dL (ref 12.0–15.0)
Immature Granulocytes: 0 %
Lymphocytes Relative: 43 %
Lymphs Abs: 2.9 10*3/uL (ref 0.7–4.0)
MCH: 30.3 pg (ref 26.0–34.0)
MCHC: 32.9 g/dL (ref 30.0–36.0)
MCV: 92 fL (ref 80.0–100.0)
Monocytes Absolute: 0.6 10*3/uL (ref 0.1–1.0)
Monocytes Relative: 8 %
Neutro Abs: 3.3 10*3/uL (ref 1.7–7.7)
Neutrophils Relative %: 48 %
Platelets: 173 10*3/uL (ref 150–400)
RBC: 4 MIL/uL (ref 3.87–5.11)
RDW: 13.6 % (ref 11.5–15.5)
WBC: 6.9 10*3/uL (ref 4.0–10.5)
nRBC: 0 % (ref 0.0–0.2)

## 2018-08-16 LAB — LIPID PANEL
Cholesterol: 160 mg/dL (ref 0–200)
HDL: 58 mg/dL (ref >40)
LDL Cholesterol: 94 mg/dL (ref 0–99)
Total CHOL/HDL Ratio: 2.8 RATIO
Triglycerides: 42 mg/dL (ref <150)
VLDL: 8 mg/dL (ref 0–40)

## 2018-08-16 LAB — BASIC METABOLIC PANEL
Anion gap: 8 (ref 5–15)
BUN: 15 mg/dL (ref 8–23)
CO2: 24 mmol/L (ref 22–32)
Calcium: 8.4 mg/dL — ABNORMAL LOW (ref 8.9–10.3)
Chloride: 109 mmol/L (ref 98–111)
Creatinine, Ser: 0.62 mg/dL (ref 0.44–1.00)
GFR calc Af Amer: >60 mL/min (ref >60)
GFR calc non Af Amer: >60 mL/min (ref >60)
Glucose, Bld: 92 mg/dL (ref 70–99)
Potassium: 3.8 mmol/L (ref 3.5–5.1)
Sodium: 141 mmol/L (ref 135–145)

## 2018-08-16 LAB — MAGNESIUM: Magnesium: 2.1 mg/dL (ref 1.7–2.4)

## 2018-08-16 LAB — GLUCOSE, CAPILLARY: Glucose-Capillary: 171 mg/dL — ABNORMAL HIGH (ref 70–99)

## 2018-08-16 LAB — HEMOGLOBIN A1C
Hgb A1c MFr Bld: 6.1 % — ABNORMAL HIGH (ref 4.8–5.6)
Mean Plasma Glucose: 128.37 mg/dL

## 2018-08-16 NOTE — Discharge Summary (Addendum)
Discharge Summary  Linda Arnold Arico ZOX:[id number] DOB: January 11, 1951  PCP: Renaye RakersBland, Veita, MD  Admit date: 08/14/2018 Discharge date: 08/16/2018  Time spent: 30mins  Recommendations for Outpatient Follow-up:  1. F/u with PCP within a week  for hospital discharge follow up, repeat cbc/bmp at follow up, pcp to arrange outpatient sleep study 2. F/u with neurology as needed for memory issues   Discharge Diagnoses:  Active Hospital Problems   Diagnosis Date Noted   Acute encephalopathy 08/14/2018   Leucocytosis    Lactic acidosis    Diabetes mellitus type 2, noninsulin dependent (HCC) 12/25/2006    Resolved Hospital Problems  No resolved problems to display.    Discharge Condition: stable  Diet recommendation: heart healthy/carb modified  Filed Weights   08/14/18 1918  Weight: 95.3 kg    History of present illness: (per admitting MD Dr Julian ReilGardner)  PCP: Gilda CreasePavelock, Richard M, MD  Patient coming from: Home  I have personally briefly reviewed patient's old medical records in [facility]  Chief Complaint: Confusion  HPI: Linda Arnold Printy is a 68 y.o. female with medical history significant of DM2, HTN.  Patient LKW yesterday.  Patient has been at home for past 2 months quarantining herself due to COVID.  Patient's sister states that she was on the phone with the patient around 5:30 PM today and realized her sister was repeating her questions and having memory loss and confusion.  The patient's sister states that she has not seen the patient in months but did speak to the patient over the phone around noon time yesterday and the patient seems to be acting her normal self. Patient sister then went over to the patient's house to check on her and realized the patient was very confused and did not know what was going on. This lasted for about 2 hours before the sister decided to call EMS. Denies any slurred speech, facial asymmetry, extremity weakness. Patient has no complaints of  any pain or discomfort, fever or cough. Patient is unsure of the month and is repeating her questions.   ED Course: Work up in ED is negative for abnormality thus far.  Hospital Course:  Principal Problem:   Acute encephalopathy Active Problems:   Diabetes mellitus type 2, noninsulin dependent (HCC)   Leucocytosis   Lactic acidosis   Acute metabolic encephalopathy -she was very confused upon arrival to the ED,  Though improving , She remains confused on 6/10, she could not remember the event -ct head no acute findings, ua /uds unremarkable, ammonia unremarkable -home meds oxybutynin held in the hospital and discontinued at discharge --MRI concerns for acute CVA, neurology consult, per neurology mri findings likely artifact, cta headneck 1. Minimal intracranial atherosclerosis without large vessel occlusion or significant proximal stenosis. 2. Widely patent cervical carotid and vertebral arteries." - EEG unremarkable -on 6/11 she is back to baseline, she reports recently under a lot of stress, has not been able to sleep at all, last night was her first night for a long time that she has been able to sleep for more than 4hrs -she reports pcp is arranging sleep study  -refer for holter monitor for syncope, she can not remember if she has passed out or not, she lives by herself. troponin negative, ekg no acute findings, echo cardiogram unremarkable    leukocytosis wbc 12.5 and lactic acidosis lactic acid -ua/cxr unremarkable, she does not have fever -she received 500cc fluid bolus on presentation, -home meds metformin and  maxzide held in the hospital -wbc  and lactic acid has normalized Metformin resumed at discharge, maxzide discontinued at discharge.  noninsulin dependent dm2 -well controlled, a1c 6.1 - metformin held in the hospital, resumed at discharge  HLD; continue statin    Code Status: full  Family Communication: patient   Disposition Plan: home after  patient back to baseline and clears by neurology   Consultants:  neurology  Procedures:  none  Antibiotics:  none  Discharge Exam: BP 115/89 (BP Location: Left Arm)    Pulse 78    Temp 98.5 F (36.9 C) (Oral)    Resp 20    Ht 5' 2.5" (1.588 m)    Wt 95.3 kg    SpO2 100%    BMI 37.80 kg/m   General: NAD, fully alert Cardiovascular: RRR Respiratory: CTABL  Discharge Instructions You were cared for by a hospitalist during your hospital stay. If you have any questions about your discharge medications or the care you received while you were in the hospital after you are discharged, you can call the unit and asked to speak with the hospitalist on call if the hospitalist that took care of you is not available. Once you are discharged, your primary care physician will handle any further medical issues. Please note that NO REFILLS for any discharge medications will be authorized once you are discharged, as it is imperative that you return to your primary care physician (or establish a relationship with a primary care physician if you do not have one) for your aftercare needs so that they can reassess your need for medications and monitor your lab values.  Discharge Instructions    Diet - low sodium heart healthy   Complete by: As directed    Carb modified diet   Increase activity slowly   Complete by: As directed      Allergies as of 08/16/2018      Reactions   Betadine [povidone Iodine] Rash   Lisinopril Rash      Medication List    STOP taking these medications   OVER THE COUNTER MEDICATION   oxybutynin 5 MG 24 hr tablet Commonly known as: DITROPAN-XL   triamterene-hydrochlorothiazide 37.5-25 MG tablet Commonly known as: MAXZIDE-25     TAKE these medications   calcium citrate-vitamin D 200-200 MG-UNIT Tabs Take 2 tablets by mouth daily.   metFORMIN 500 MG tablet Commonly known as: GLUCOPHAGE Take 500 mg by mouth 2 (two) times a day.   pravastatin 40 MG  tablet Commonly known as: PRAVACHOL Take 40 mg by mouth daily.      Allergies  Allergen Reactions   Betadine [Povidone Iodine] Rash   Lisinopril Rash   Follow-up Information    Pavelock, Duke Salviaichard M, MD Follow up in 1 week(s).   Specialty: Internal Medicine Why: hospital discharge follow up Contact information: [address] [place] [id number]        follow up with pcp in one week Follow up.   Why: pcp to repeat cbc/bmp at follow up. pcp to arrange outpatient sleep study       [facility] Follow up.   Why: as needed for memory issues  Contact information: [address]     [address] [place] [phone]           The results of significant diagnostics from this hospitalization (including imaging, microbiology, ancillary and laboratory) are listed below for reference.    Significant Diagnostic Studies: Ct Angio Head W Or Wo Contrast  Result Date: 08/15/2018 CLINICAL DATA:  Amnesia. Punctate infarct versus artifact in the posterior right frontal lobe on MRI. History of diabetes, hypertension, and hyperlipidemia. EXAM: CT ANGIOGRAPHY HEAD AND NECK TECHNIQUE: Multidetector CT imaging of the head and neck was performed using the standard protocol during bolus administration of intravenous contrast. Multiplanar CT image reconstructions and MIPs were obtained to evaluate the vascular anatomy. Carotid stenosis measurements (when applicable) are obtained utilizing NASCET criteria, using the distal internal carotid diameter as the denominator. CONTRAST:  75mL OMNIPAQUE IOHEXOL 350 MG/ML SOLN COMPARISON:  None. FINDINGS: CTA NECK FINDINGS Aortic arch: Standard 3 vessel aortic arch with widely patent arch vessel origins. Right carotid system: Patent and smooth without evidence of stenosis or dissection Left carotid system: Patent and smooth without evidence of stenosis or dissection. Vertebral arteries:  Patent and smooth without evidence of stenosis or dissection. Codominant. Skeleton: Focal disc degeneration with prominent anterior osteophyte formation at C5-6. Severe right facet arthrosis at C4-5. Other neck: Mild asymmetric dilatation of the left submandibular duct without acute inflammatory changes or an obstructing stone identified within limitations of dental streak artifact. Small bilateral thyroid nodules measuring up to 1 cm. Upper chest: Partial imaging of predominantly peripheral ground-glass opacity and mild subpleural reticulation in both lungs presumably reflecting chronic lung disease based on a 2005 chest CT. Review of the MIP images confirms the above findings CTA HEAD FINDINGS Anterior circulation: The internal carotid arteries are patent from skull base to carotid termini with slight narrowing on the left near the petrous-cavernous junction. ACAs and MCAs are patent with mild branch vessel irregularity but no evidence of proximal branch occlusion or significant proximal stenosis. A small developmental venous anomaly is incidentally noted in the posterior right frontal lobe. No aneurysm is identified. Posterior circulation: The intracranial vertebral arteries are widely patent to the basilar. Patent right PICA, left AICA, and bilateral SCA origins are visualized. The basilar artery is widely patent. Posterior communicating arteries are diminutive or absent. PCAs are patent without evidence of significant proximal stenosis. No aneurysm is identified. Venous sinuses: Patent. Anatomic variants: None. Review of the MIP images confirms the above findings IMPRESSION: 1. Minimal intracranial atherosclerosis without large vessel occlusion or significant proximal stenosis. 2. Widely patent cervical carotid and vertebral arteries. Electronically Signed   By: Sebastian Ache M.D.   On: 08/15/2018 16:10   Ct Head Wo Contrast  Result Date: 08/14/2018 CLINICAL DATA:  Initial evaluation for acute memory loss.  EXAM: CT HEAD WITHOUT CONTRAST TECHNIQUE: Contiguous axial images were obtained from the base of the skull through the vertex without intravenous contrast. COMPARISON:  None available. FINDINGS: Brain: Cerebral volume within normal limits for patient age. No evidence for acute intracranial hemorrhage. No findings to suggest acute large vessel territory infarct. No mass lesion, midline shift, or mass effect. Ventricles are normal in size without evidence for hydrocephalus. No extra-axial fluid collection identified. Vascular: No hyperdense vessel identified. Skull: Scalp soft tissues demonstrate no acute abnormality. Calvarium intact. Sinuses/Orbits: Globes and orbital soft tissues within normal limits. Right maxillary sinus retention cyst. Paranasal sinuses are otherwise clear. No mastoid effusion. IMPRESSION: Normal head CT.  No acute intracranial abnormality identified. Electronically Signed   By: Rise Mu M.D.   On: 08/14/2018 22:09   Ct Angio Neck W Or Wo Contrast  Result Date: 08/15/2018 CLINICAL DATA:  Amnesia. Punctate infarct versus artifact in the posterior right frontal lobe on MRI. History of diabetes, hypertension, and hyperlipidemia. EXAM: CT ANGIOGRAPHY HEAD AND NECK TECHNIQUE: Multidetector CT  imaging of the head and neck was performed using the standard protocol during bolus administration of intravenous contrast. Multiplanar CT image reconstructions and MIPs were obtained to evaluate the vascular anatomy. Carotid stenosis measurements (when applicable) are obtained utilizing NASCET criteria, using the distal internal carotid diameter as the denominator. CONTRAST:  75mL OMNIPAQUE IOHEXOL 350 MG/ML SOLN COMPARISON:  None. FINDINGS: CTA NECK FINDINGS Aortic arch: Standard 3 vessel aortic arch with widely patent arch vessel origins. Right carotid system: Patent and smooth without evidence of stenosis or dissection Left carotid system: Patent and smooth without evidence of stenosis or  dissection. Vertebral arteries: Patent and smooth without evidence of stenosis or dissection. Codominant. Skeleton: Focal disc degeneration with prominent anterior osteophyte formation at C5-6. Severe right facet arthrosis at C4-5. Other neck: Mild asymmetric dilatation of the left submandibular duct without acute inflammatory changes or an obstructing stone identified within limitations of dental streak artifact. Small bilateral thyroid nodules measuring up to 1 cm. Upper chest: Partial imaging of predominantly peripheral ground-glass opacity and mild subpleural reticulation in both lungs presumably reflecting chronic lung disease based on a 2005 chest CT. Review of the MIP images confirms the above findings CTA HEAD FINDINGS Anterior circulation: The internal carotid arteries are patent from skull base to carotid termini with slight narrowing on the left near the petrous-cavernous junction. ACAs and MCAs are patent with mild branch vessel irregularity but no evidence of proximal branch occlusion or significant proximal stenosis. A small developmental venous anomaly is incidentally noted in the posterior right frontal lobe. No aneurysm is identified. Posterior circulation: The intracranial vertebral arteries are widely patent to the basilar. Patent right PICA, left AICA, and bilateral SCA origins are visualized. The basilar artery is widely patent. Posterior communicating arteries are diminutive or absent. PCAs are patent without evidence of significant proximal stenosis. No aneurysm is identified. Venous sinuses: Patent. Anatomic variants: None. Review of the MIP images confirms the above findings IMPRESSION: 1. Minimal intracranial atherosclerosis without large vessel occlusion or significant proximal stenosis. 2. Widely patent cervical carotid and vertebral arteries. Electronically Signed   By: Sebastian AcheAllen  Grady M.D.   On: 08/15/2018 16:10   Mr Brain Wo Contrast  Result Date: 08/15/2018 CLINICAL DATA:   Encephalopathy. Acute memory loss/confusion yesterday. EXAM: MRI HEAD WITHOUT CONTRAST TECHNIQUE: Multiplanar, multiecho pulse sequences of the brain and surrounding structures were obtained without intravenous contrast. COMPARISON:  Head CT 08/14/2018 FINDINGS: Brain: There is a 2 mm focus of mildly increased trace diffusion weighted signal involving cortex of the right precentral gyrus (series 3, image 36) on axial imaging. Assessment for reduced ADC is limited by small size, and assessment on coronal imaging is limited by slice selection. Single punctate foci of mildly increased trace diffusion signal in the posteromedial temporal lobes bilaterally are favored to be artifactual given bilaterality. No acute large territory infarct, intracranial hemorrhage, mass, midline shift, or extra-axial fluid collection is identified. A single subcentimeter focus of subcortical T2/FLAIR hyperintensity in the right frontal operculum is not considered abnormal for age. Mild cerebral atrophy is within normal limits for age. Vascular: Major intracranial vascular flow voids are preserved. Skull and upper cervical spine: No suspicious marrow lesion. Sinuses/Orbits: Unremarkable orbits. Small sphenoid sinus mucous retention cysts. Clear mastoid air cells. Other: None. IMPRESSION: 1. Punctate acute/subacute infarct versus artifact in the right precentral gyrus. 2. Otherwise unremarkable appearance of the brain for age. Electronically Signed   By: Sebastian AcheAllen  Grady M.D.   On: 08/15/2018 13:05   [facility]  Result Date: 08/15/2018 CLINICAL DATA:  Altered mental status EXAM: PORTABLE CHEST 1 VIEW COMPARISON:  12/09/2013 FINDINGS: The heart size is enlarged. There is no pneumothorax. No large pleural effusion. There is some elevation of the right hemidiaphragm. There is no acute osseous abnormality. There is no focal area of consolidation. There is likely some atelectasis near the right costophrenic angle. IMPRESSION: No active  disease. Electronically Signed   By: Constance Holster M.D.   On: 08/15/2018 00:49    Microbiology: Recent Results (from the past 240 hour(s))  SARS Coronavirus 2     Status: None   Collection Time: 08/14/18 10:49 PM  Result Value Ref Range Status   SARS Coronavirus 2 NOT DETECTED NOT DETECTED Final    Comment: (NOTE) SARS-CoV-2 target nucleic acids are NOT DETECTED. The SARS-CoV-2 RNA is generally detectable in upper and lower respiratory specimens during the acute phase of infection.  Negative  results do not preclude SARS-CoV-2 infection, do not rule out co-infections with other pathogens, and should not be used as the sole basis for treatment or other patient management decisions.  Negative results must be combined with clinical observations, patient history, and epidemiological information. The expected result is Not Detected. Fact Sheet for Patients: http://www.biofiredefense.com/wp-content/uploads/2020/03/BIOFIRE-COVID -19-patients.pdf Fact Sheet for Healthcare Providers: http://www.biofiredefense.com/wp-content/uploads/2020/03/BIOFIRE-COVID -19-hcp.pdf This test is not yet approved or cleared by the [place] and  has been authorized for detection and/or diagnosis of SARS-CoV-2 by FDA under an Emergency Use Authorization (EUA).  This EUA will remain in effec t (meaning this test can be used) for the duration of  the COVID-19 declaration under Section 564(b)(1) of the Act, 21 U.S.C. section 360bbb-3(b)(1), unless the authorization is terminated or revoked sooner. Performed at [facility], [place] [address], [place], [place] [address]      Labs: Basic Metabolic Panel: Recent Labs  Lab 08/14/18 1946 08/15/18 0002 08/16/18 0319  NA 138 140 141  K 3.6 3.8 3.8  CL 102 106 109  CO2 23 24 24   GLUCOSE 167* 132* 92  BUN 12 10 15   CREATININE 0.75 0.71 0.62  CALCIUM 9.4 9.3 8.4*  MG  --   --  2.1   Liver Function Tests: Recent Labs  Lab 08/14/18 1946  08/15/18 0002  AST 28 22  ALT 21 19  ALKPHOS 70 59  BILITOT 0.6 0.9  PROT 7.2 6.5  ALBUMIN 4.1 3.6   No results for input(s): LIPASE, AMYLASE in the last 168 hours. Recent Labs  Lab 08/14/18 2249  AMMONIA 20   CBC: Recent Labs  Lab 08/14/18 1946 08/15/18 0002 08/16/18 0319  WBC 12.5* 10.7* 6.9  NEUTROABS 10.1*  --  3.3  HGB 13.6 12.5 12.1  HCT 42.1 36.7 36.8  MCV 92.5 90.2 92.0  PLT 192 168 173   Cardiac Enzymes: No results for input(s): CKTOTAL, CKMB, CKMBINDEX, TROPONINI in the last 168 hours. BNP: BNP (last 3 results) No results for input(s): BNP in the last 8760 hours.  ProBNP (last 3 results) No results for input(s): PROBNP in the last 8760 hours.  CBG: Recent Labs  Lab 08/15/18 1251 08/15/18 1319 08/15/18 1715 08/15/18 2109 08/16/18 0826  GLUCAP 100* 168* 107* 138* 171*       Signed:  Florencia Reasons MD, PhD  [facility] 08/16/2018, 1:14 PM

## 2018-08-16 NOTE — Progress Notes (Signed)
Linda Arnold to be D/C'd home  per MD order.  Discussed with the patient and all questions fully answered.  VSS, Skin clean, dry and intact without evidence of skin break down, no evidence of skin tears noted. IV catheter discontinued intact. Site without signs and symptoms of complications. Dressing and pressure applied.  An After Visit Summary was printed and given to the patient. Patient received prescription.  D/c education completed with patient/family including follow up instructions, medication list, d/c activities limitations if indicated, with other d/c instructions as indicated by MD - patient able to verbalize understanding, all questions fully answered.   Patient instructed to return to ED, call 911, or call MD for any changes in condition.   Patient escorted via [place], and D/C home via private auto.   [name] 08/16/2018 10:59 AM

## 2018-08-23 DIAGNOSIS — E785 Hyperlipidemia, unspecified: Secondary | ICD-10-CM | POA: Diagnosis not present

## 2018-08-23 DIAGNOSIS — E559 Vitamin D deficiency, unspecified: Secondary | ICD-10-CM | POA: Diagnosis not present

## 2018-08-23 DIAGNOSIS — R413 Other amnesia: Secondary | ICD-10-CM | POA: Diagnosis not present

## 2018-08-23 DIAGNOSIS — G9340 Encephalopathy, unspecified: Secondary | ICD-10-CM | POA: Diagnosis not present

## 2018-08-23 DIAGNOSIS — I10 Essential (primary) hypertension: Secondary | ICD-10-CM | POA: Diagnosis not present

## 2018-08-23 DIAGNOSIS — G4700 Insomnia, unspecified: Secondary | ICD-10-CM | POA: Diagnosis not present

## 2018-09-06 DIAGNOSIS — E1169 Type 2 diabetes mellitus with other specified complication: Secondary | ICD-10-CM | POA: Diagnosis not present

## 2018-09-06 DIAGNOSIS — I10 Essential (primary) hypertension: Secondary | ICD-10-CM | POA: Diagnosis not present

## 2018-09-06 DIAGNOSIS — R413 Other amnesia: Secondary | ICD-10-CM | POA: Diagnosis not present

## 2018-09-06 DIAGNOSIS — E782 Mixed hyperlipidemia: Secondary | ICD-10-CM | POA: Diagnosis not present

## 2018-09-14 DIAGNOSIS — H2513 Age-related nuclear cataract, bilateral: Secondary | ICD-10-CM | POA: Diagnosis not present

## 2018-09-14 DIAGNOSIS — H43393 Other vitreous opacities, bilateral: Secondary | ICD-10-CM | POA: Diagnosis not present

## 2018-09-18 ENCOUNTER — Other Ambulatory Visit: Payer: Self-pay

## 2018-09-18 ENCOUNTER — Encounter: Payer: Self-pay | Admitting: Neurology

## 2018-09-18 ENCOUNTER — Ambulatory Visit (INDEPENDENT_AMBULATORY_CARE_PROVIDER_SITE_OTHER): Payer: Medicare HMO | Admitting: Neurology

## 2018-09-18 VITALS — BP 133/89 | HR 79 | Temp 96.2°F | Ht 62.5 in | Wt 189.0 lb

## 2018-09-18 DIAGNOSIS — G454 Transient global amnesia: Secondary | ICD-10-CM

## 2018-09-18 DIAGNOSIS — R799 Abnormal finding of blood chemistry, unspecified: Secondary | ICD-10-CM | POA: Diagnosis not present

## 2018-09-18 DIAGNOSIS — E538 Deficiency of other specified B group vitamins: Secondary | ICD-10-CM | POA: Diagnosis not present

## 2018-09-18 HISTORY — DX: Transient global amnesia: G45.4

## 2018-09-18 NOTE — Patient Instructions (Signed)
May start baby Aspirin 81mg  daily  Moderate exercise.

## 2018-09-18 NOTE — Progress Notes (Signed)
PATIENT: Linda Arnold DOB: 1950/11/25  Chief Complaint  Patient presents with  . Memory concerns    MMSE 29/30 - 11 animals.  Reports mild memory issues in general. She is more concernd about an event that caused total memory loss for the better part of a day.  She was taken to the ED for evaluation and would like to discuss her visit.  Marland Kitchen. PCP    Renaye RakersBland, Veita, MD (referred from hospital)     HISTORICAL  Linda Arnold is a 68 year old female, seen in request by her primary care physician Dr. Parke SimmersBland, Adrian SaranVeita for evaluation of memory loss, initial evaluation was on September 18, 2018.  I have reviewed and summarized the referring note from the referring physician.  She had a past medical history of diabetes, hyperlipidemia, she used to work at [employer] job, did daycare work for about 3 years, she lives alone, still drives, around 2008, she noticed gradual onset memory loss, initially forget people's name, telephone number, gradually getting worse, she forgot what she would have for breath first quickly  She was admitted to the hospital on August 14, 2018, while her sister talked with her, she was noted to repeating questions, confused, her sister came over to check on her, realize she was very confused, did not know what was going on, her sister called EMS, she was admitted to the hospital, she cannot remember the ride to hospital, woke up few hours later realized that she is in hospital.  I personally reviewed the brain on August 15, 2018, punctuated acute infarction at the right precentral gyri, otherwise there was generalized atrophy, especially at the left perisylvian fissure, CT angiogram of neck and brain showed no large vessel disease.  Laboratory evaluations showed normal lipid profile, A1c was 6.1, BMP showed calcium 8.4, normal CBC, Echocardiogram on August 15 2018, showed ejection fraction 60 to 65%,  REVIEW OF SYSTEMS: Full 14 system review of systems performed and notable only for as  above All other review of systems were negative.  ALLERGIES: Allergies  Allergen Reactions  . Betadine [Povidone Iodine] Rash  . Lisinopril Rash    HOME MEDICATIONS: Current Outpatient Medications  Medication Sig Dispense Refill  . Calcium Citrate (CITRACAL PO) Take 1 tablet by mouth daily.    . metFORMIN (GLUCOPHAGE) 500 MG tablet Take 500 mg by mouth 2 (two) times a day.     . pravastatin (PRAVACHOL) 40 MG tablet Take 40 mg by mouth daily.     No current facility-administered medications for this visit.     PAST MEDICAL HISTORY: Past Medical History:  Diagnosis Date  . Arthritis 1999   knees  . Diabetes mellitus without complication (HCC) 2005  . Dysphagia   . Hyperlipidemia   . Hypertension   . Memory loss     PAST SURGICAL HISTORY: Past Surgical History:  Procedure Laterality Date  . CESAREAN SECTION    . TUBAL LIGATION      FAMILY HISTORY: Family History  Problem Relation Age of Onset  . Diabetes Mother        died at age 70  . Congestive Heart Failure Mother   . Diabetes Sister   . Hyperlipidemia Sister   . Hypertension Sister   . Cancer Brother [id number]       Lung cancer x2 brothers  . Hypertension Brother   . Other Father        died in [id number] from not being able to "pass his urine"  SOCIAL HISTORY: Social History   Socioeconomic History  . Marital status: Widowed    Spouse name: Not on file  . Number of children: 1  . Years of education: some college  . Highest education level: Not on file  Occupational History  . Occupation: retired  Scientific laboratory technician  . Financial resource strain: Not on file  . Food insecurity    Worry: Not on file    Inability: Not on file  . Transportation needs    Medical: Not on file    Non-medical: Not on file  Tobacco Use  . Smoking status: Never Smoker  . Smokeless tobacco: Never Used  Substance and Sexual Activity  . Alcohol use: Never    Frequency: Never  . Drug use: No  . Sexual activity: Not Currently   Lifestyle  . Physical activity    Days per week: Not on file    Minutes per session: Not on file  . Stress: Not on file  Relationships  . Social Herbalist on phone: Not on file    Gets together: Not on file    Attends religious service: Not on file    Active member of club or organization: Not on file    Attends meetings of clubs or organizations: Not on file    Relationship status: Not on file  . Intimate partner violence    Fear of current or ex partner: Not on file    Emotionally abused: Not on file    Physically abused: Not on file    Forced sexual activity: Not on file  Other Topics Concern  . Not on file  Social History Narrative   Widowed and unemployed. Attended some college. Walks infrequently for exercise. No tobacco or drug use. Previously went to [employer] until 2013.      Lives alone.   No caffeine use.   Right-handed.     PHYSICAL EXAM   Vitals:   09/18/18 0755  BP: 133/89  Pulse: 79  Temp: (!) 96.2 F (35.7 C)  Weight: 189 lb (85.7 kg)  Height: 5' 2.5" (1.588 m)    Not recorded      Body mass index is 34.02 kg/m.  PHYSICAL EXAMNIATION:  Gen: NAD, conversant, well nourised, obese, well groomed                     Cardiovascular: Regular rate rhythm, no peripheral edema, warm, nontender. Eyes: Conjunctivae clear without exudates or hemorrhage Neck: Supple, no carotid bruits. Pulmonary: Clear to auscultation bilaterally   NEUROLOGICAL EXAM:  MMSE - Mini Mental State Exam 09/18/2018  Orientation to time 5  Orientation to Place 5  Registration 3  Attention/ Calculation 5  Recall 2  Language- name 2 objects 2  Language- repeat 1  Language- follow 3 step command 3  Language- read & follow direction 1  Write a sentence 1  Copy design 1  Total score 29  animal naming 29   CRANIAL NERVES: CN II: Visual fields are full to confrontation.Pupils are round equal and briskly reactive to light. CN III, IV, VI: extraocular movement  are normal. No ptosis. CN V: Facial sensation is intact to pinprick in all 3 divisions bilaterally. Corneal responses are intact.  CN VII: Face is symmetric with normal eye closure and smile. CN VIII: Hearing is normal to rubbing fingers CN IX, X: Palate elevates symmetrically. Phonation is normal. CN XI: Head turning and shoulder shrug are intact CN XII: Tongue is  midline with normal movements and no atrophy.  MOTOR: There is no pronator drift of out-stretched arms. Muscle bulk and tone are normal. Muscle strength is normal.  REFLEXES: Reflexes are 2+ and symmetric at the biceps, triceps, knees, and ankles. Plantar responses are flexor.  SENSORY: Intact to light touch, pinprick, positional sensation and vibratory sensation are intact in fingers and toes.  COORDINATION: Rapid alternating movements and fine finger movements are intact. There is no dysmetria on finger-to-nose and heel-knee-shin.    GAIT/STANCE: Posture is normal. Gait is steady with normal steps, base, arm swing, and turning.   DIAGNOSTIC DATA (LABS, IMAGING, TESTING) - I reviewed patient records, labs, notes, testing and imaging myself where available.   ASSESSMENT AND PLAN  Linda Arnold is a 68 y.o. 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 Ph.D.  [facility] [address], [address] [place], [place] [address] Ph: [phone] Fax: [phone]  CC: Renaye RakersBland, Veita, MD

## 2018-09-19 ENCOUNTER — Telehealth: Payer: Self-pay | Admitting: *Deleted

## 2018-09-19 LAB — VITAMIN B12: Vitamin B-12: 522 pg/mL (ref 232–1245)

## 2018-09-19 LAB — TSH: TSH: 2.84 u[IU]/mL (ref 0.450–4.500)

## 2018-09-19 LAB — RPR: RPR Ser Ql: NONREACTIVE

## 2018-09-19 NOTE — Telephone Encounter (Signed)
-----   Message from Marcial Pacas, MD sent at 09/19/2018  8:04 AM EDT ----- Please call patient for normal laboratory result

## 2018-09-19 NOTE — Telephone Encounter (Signed)
I have spoken with the patient and she is aware of her lab results.

## 2018-09-26 ENCOUNTER — Ambulatory Visit (INDEPENDENT_AMBULATORY_CARE_PROVIDER_SITE_OTHER): Payer: Medicare HMO | Admitting: Neurology

## 2018-09-26 ENCOUNTER — Other Ambulatory Visit: Payer: Self-pay

## 2018-09-26 DIAGNOSIS — R410 Disorientation, unspecified: Secondary | ICD-10-CM | POA: Diagnosis not present

## 2018-09-26 DIAGNOSIS — G454 Transient global amnesia: Secondary | ICD-10-CM

## 2018-09-29 DIAGNOSIS — G4733 Obstructive sleep apnea (adult) (pediatric): Secondary | ICD-10-CM | POA: Diagnosis not present

## 2018-10-08 DIAGNOSIS — M130 Polyarthritis, unspecified: Secondary | ICD-10-CM | POA: Diagnosis not present

## 2018-10-08 DIAGNOSIS — G4700 Insomnia, unspecified: Secondary | ICD-10-CM | POA: Diagnosis not present

## 2018-10-13 NOTE — Procedures (Signed)
   HISTORY: 68 years old female, presented with memory loss, there was also episode of sudden onset of confusion.  TECHNIQUE:  This is a routine 16 channel EEG recording with one channel devoted to a limited EKG recording.  It was performed during wakefulness, drowsiness and asleep.  Hyperventilation and photic stimulation were performed as activating procedures.  There are minimum muscle and movement artifact noted.  Upon maximum arousal, posterior dominant waking rhythm consistent of rhythmic alpha range activity, with frequency of 9 Hz. Activities are symmetric over the bilateral posterior derivations and attenuated with eye opening. There are frequent bilateral frontal predominant theta range activity.  Hyperventilation produced mild/moderate buildup with higher amplitude and the slower activities noted.  Photic stimulation did not alter the tracing.  During EEG recording, patient developed drowsiness and no deeper stage of sleep was achieved.  During EEG recording, there was no epileptiform discharge noted.  EKG demonstrate sinus rhythm, with heart rate of 86 bpm.  CONCLUSION: This is a  normal awake EEG.  There is no electrodiagnostic evidence of epileptiform discharge.  Marcial Pacas, M.D. Ph.D.  [facility] [place], [place] [address] Phone: [phone] Fax:      [phone]

## 2018-10-15 ENCOUNTER — Telehealth: Payer: Self-pay | Admitting: *Deleted

## 2018-10-15 NOTE — Telephone Encounter (Signed)
I have spoken with the patient and she is aware of her results.  

## 2018-10-15 NOTE — Telephone Encounter (Signed)
-----   Message from Marcial Pacas, MD sent at 10/15/2018  2:47 PM EDT ----- Please call patient for normal laboratory result

## 2018-11-19 DIAGNOSIS — M130 Polyarthritis, unspecified: Secondary | ICD-10-CM | POA: Diagnosis not present

## 2018-11-19 DIAGNOSIS — E1100 Type 2 diabetes mellitus with hyperosmolarity without nonketotic hyperglycemic-hyperosmolar coma (NKHHC): Secondary | ICD-10-CM | POA: Diagnosis not present

## 2018-11-19 DIAGNOSIS — R3915 Urgency of urination: Secondary | ICD-10-CM | POA: Diagnosis not present

## 2018-11-19 DIAGNOSIS — E782 Mixed hyperlipidemia: Secondary | ICD-10-CM | POA: Diagnosis not present

## 2018-11-19 DIAGNOSIS — G4730 Sleep apnea, unspecified: Secondary | ICD-10-CM | POA: Diagnosis not present

## 2018-11-19 DIAGNOSIS — I10 Essential (primary) hypertension: Secondary | ICD-10-CM | POA: Diagnosis not present

## 2018-11-23 DIAGNOSIS — G4733 Obstructive sleep apnea (adult) (pediatric): Secondary | ICD-10-CM | POA: Diagnosis not present

## 2018-12-19 DIAGNOSIS — M130 Polyarthritis, unspecified: Secondary | ICD-10-CM | POA: Diagnosis not present

## 2018-12-19 DIAGNOSIS — G4730 Sleep apnea, unspecified: Secondary | ICD-10-CM | POA: Diagnosis not present

## 2018-12-19 DIAGNOSIS — I10 Essential (primary) hypertension: Secondary | ICD-10-CM | POA: Diagnosis not present

## 2018-12-19 DIAGNOSIS — E1169 Type 2 diabetes mellitus with other specified complication: Secondary | ICD-10-CM | POA: Diagnosis not present

## 2018-12-19 NOTE — Progress Notes (Signed)
PATIENT: Linda SchleinLillie Arnold DOB: 05/21/1950  REASON FOR VISIT: follow up HISTORY FROM: patient  HISTORY OF PRESENT ILLNESS: Today 12/20/18  HISTORY  Linda SchleinLillie Arnold is a 68 year old female, seen in request by her primary care physician Dr. Parke SimmersBland, Adrian SaranVeita for evaluation of memory loss, initial evaluation was on September 18, 2018.  I have reviewed and summarized the referring note from the referring physician.  She had a past medical history of diabetes, hyperlipidemia, she used to work at [employer] job, did daycare work for about 3 years, she lives alone, still drives, around 2008, she noticed gradual onset memory loss, initially forget people's name, telephone number, gradually getting worse, she forgot what she would have for breath first quickly  She was admitted to the hospital on August 14, 2018, while her sister talked with her, she was noted to repeating questions, confused, her sister came over to check on her, realize she was very confused, did not know what was going on, her sister called EMS, she was admitted to the hospital, she cannot remember the ride to hospital, woke up few hours later realized that she is in hospital.  I personally reviewed the brain on August 15, 2018, punctuated acute infarction at the right precentral gyri, otherwise there was generalized atrophy, especially at the left perisylvian fissure, CT angiogram of neck and brain showed no large vessel disease.  Laboratory evaluations showed normal lipid profile, A1c was 6.1, BMP showed calcium 8.4, normal CBC, Echocardiogram on August 15 2018, showed ejection fraction 60 to 65%,  Update December 20, 2018 SS: She had a normal EEG in July 2020.  She has not had any further episodes of amnesia or prolonged memory loss.  She indicates she continues to report trouble with her short-term memory. Specifically, she records her daily food intake, often forgets what she ate for meals.  She lives alone, does her own ADLs, pays her  bills, and drives a car without difficulty.  She walks 30 minutes daily.  Within the last month she was diagnosed with obstructive sleep apnea, has now started CPAP.  She presents today for follow-up unaccompanied.  She does report that she has noticed memory loss for the last 3 years. No family history of memory loss.   TSH, RPR, Vitamin B12 were normal.   REVIEW OF SYSTEMS: Out of a complete 14 system review of symptoms, the patient complains only of the following symptoms, and all other reviewed systems are negative.  Insomnia, incontinence of bladder, joint pain, itching, memory loss  ALLERGIES: Allergies  Allergen Reactions  . Betadine [Povidone Iodine] Rash  . Lisinopril Rash    HOME MEDICATIONS: Outpatient Medications Prior to Visit  Medication Sig Dispense Refill  . Calcium Citrate (CITRACAL PO) Take 1 tablet by mouth daily.    Marland Kitchen. losartan (COZAAR) 25 MG tablet Take 25 mg by mouth daily.    . metFORMIN (GLUCOPHAGE) 500 MG tablet Take 500 mg by mouth 2 (two) times a day.     . mirabegron ER (MYRBETRIQ) 50 MG TB24 tablet Take 50 mg by mouth daily.    . pravastatin (PRAVACHOL) 40 MG tablet Take 40 mg by mouth daily.     No facility-administered medications prior to visit.     PAST MEDICAL HISTORY: Past Medical History:  Diagnosis Date  . Arthritis 1999   knees  . Diabetes mellitus without complication (HCC) 2005  . Dysphagia   . Hyperlipidemia   . Hypertension   . Memory loss  PAST SURGICAL HISTORY: Past Surgical History:  Procedure Laterality Date  . CESAREAN SECTION    . TUBAL LIGATION      FAMILY HISTORY: Family History  Problem Relation Age of Onset  . Diabetes Mother        died at age 46  . Congestive Heart Failure Mother   . Diabetes Sister   . Hyperlipidemia Sister   . Hypertension Sister   . Cancer Brother 47       Lung cancer x2 brothers  . Hypertension Brother   . Other Father        died in 37 from not being able to "pass his urine"     SOCIAL HISTORY: Social History   Socioeconomic History  . Marital status: Widowed    Spouse name: Not on file  . Number of children: 1  . Years of education: some college  . Highest education level: Not on file  Occupational History  . Occupation: retired  Engineer, production  . Financial resource strain: Not on file  . Food insecurity    Worry: Not on file    Inability: Not on file  . Transportation needs    Medical: Not on file    Non-medical: Not on file  Tobacco Use  . Smoking status: Never Smoker  . Smokeless tobacco: Never Used  Substance and Sexual Activity  . Alcohol use: Never    Frequency: Never  . Drug use: No  . Sexual activity: Not Currently  Lifestyle  . Physical activity    Days per week: Not on file    Minutes per session: Not on file  . Stress: Not on file  Relationships  . Social Musician on phone: Not on file    Gets together: Not on file    Attends religious service: Not on file    Active member of club or organization: Not on file    Attends meetings of clubs or organizations: Not on file    Relationship status: Not on file  . Intimate partner violence    Fear of current or ex partner: Not on file    Emotionally abused: Not on file    Physically abused: Not on file    Forced sexual activity: Not on file  Other Topics Concern  . Not on file  Social History Narrative   Widowed and unemployed. Attended some college. Walks infrequently for exercise. No tobacco or drug use. Previously went to [employer] until 2013.      Lives alone.   No caffeine use.   Right-handed.      PHYSICAL EXAM  Vitals:   12/20/18 0956  BP: 131/78  Pulse: 75  Temp: 97.8 F (36.6 C)  TempSrc: Oral  Weight: 191 lb 6.4 oz (86.8 kg)  Height: 5' 2.5" (1.588 m)   Body mass index is 34.45 kg/m.  Generalized: Well developed, in no acute distress, well-appearing MMSE - Mini Mental State Exam 12/20/2018 09/18/2018  Orientation to time 5 5  Orientation to  Place 5 5  Registration 3 3  Attention/ Calculation 5 5  Recall 3 2  Language- name 2 objects 2 2  Language- repeat 1 1  Language- follow 3 step command 3 3  Language- read & follow direction 1 1  Write a sentence 1 1  Copy design 1 1  Total score 30 29    Neurological examination  Mentation: Alert oriented to time, place, history taking. Follows all commands speech and language fluent  Cranial nerve II-XII: Pupils were equal round reactive to light. Extraocular movements were full, visual field were full on confrontational test. Facial sensation and strength were normal. Head turning and shoulder shrug  were normal and symmetric. Motor: The motor testing reveals 5 over 5 strength of all 4 extremities. Good symmetric motor tone is noted throughout.  Sensory: Sensory testing is intact to soft touch on all 4 extremities. No evidence of extinction is noted.  Coordination: Cerebellar testing reveals good finger-nose-finger and heel-to-shin bilaterally.  Gait and station: Gait is normal. Tandem gait is normal. Romberg is negative. No drift is seen.  Reflexes: Deep tendon reflexes are symmetric and normal bilaterally.   DIAGNOSTIC DATA (LABS, IMAGING, TESTING) - I reviewed patient records, labs, notes, testing and imaging myself where available.  Lab Results  Component Value Date   WBC 6.9 08/16/2018   HGB 12.1 08/16/2018   HCT 36.8 08/16/2018   MCV 92.0 08/16/2018   PLT 173 08/16/2018      Component Value Date/Time   NA 141 08/16/2018 0319   K 3.8 08/16/2018 0319   CL 109 08/16/2018 0319   CO2 24 08/16/2018 0319   GLUCOSE 92 08/16/2018 0319   BUN 15 08/16/2018 0319   CREATININE 0.62 08/16/2018 0319   CREATININE 0.76 02/18/2013 0830   CALCIUM 8.4 (L) 08/16/2018 0319   PROT 6.5 08/15/2018 0002   ALBUMIN 3.6 08/15/2018 0002   AST 22 08/15/2018 0002   ALT 19 08/15/2018 0002   ALKPHOS 59 08/15/2018 0002   BILITOT 0.9 08/15/2018 0002   GFRNONAA >60 08/16/2018 0319   GFRAA >60  08/16/2018 0319   Lab Results  Component Value Date   CHOL 160 08/16/2018   HDL 58 08/16/2018   LDLCALC 94 08/16/2018   LDLDIRECT 112 (H) 10/29/2012   TRIG 42 08/16/2018   CHOLHDL 2.8 08/16/2018   Lab Results  Component Value Date   HGBA1C 6.1 (H) 08/16/2018   Lab Results  Component Value Date   NLZJQBHA19 379 09/18/2018   Lab Results  Component Value Date   TSH 2.840 09/18/2018   ASSESSMENT AND PLAN 68 y.o. year old female  has a past medical history of Arthritis (1999), Diabetes mellitus without complication ([place]) (0240), Dysphagia, Hyperlipidemia, Hypertension, and Memory loss. here with:  1.  Mild cognitive impairment 2.  Transient global amnesia August 14, 2018 -MMSE was perfect 30/30 today -Continue daily exercise, healthy eating, drinking plenty of water -She continues to function quite well independently, we discussed memory medications, we will hold off for now, may consider a trial in the future -Recently diagnosed with OSA, started on CPAP within the last month, this may improve her memory as well -Continue daily 81 mg aspirin  -EEG was normal, TSH, RPR, B12 was normal  -Follow-up in 4-6 months with Dr. Krista Blue   I spent 15 minutes with the patient. 50% of this time was spent in her plan of care.  Butler Denmark, AGNP-C, DNP 12/20/2018, 10:12 AM [facility] [address], [place], [place] [address] [phone]

## 2018-12-20 ENCOUNTER — Other Ambulatory Visit: Payer: Self-pay

## 2018-12-20 ENCOUNTER — Ambulatory Visit (INDEPENDENT_AMBULATORY_CARE_PROVIDER_SITE_OTHER): Payer: Medicare HMO | Admitting: Neurology

## 2018-12-20 ENCOUNTER — Encounter: Payer: Self-pay | Admitting: Neurology

## 2018-12-20 VITALS — BP 131/78 | HR 75 | Temp 97.8°F | Ht 62.5 in | Wt 191.4 lb

## 2018-12-20 DIAGNOSIS — G454 Transient global amnesia: Secondary | ICD-10-CM

## 2018-12-20 NOTE — Progress Notes (Signed)
I have reviewed and agreed above plan. 

## 2018-12-20 NOTE — Patient Instructions (Signed)
1. Continue daily 81 mg aspirin  2. Continue daily exercise, healthy eating, drink water 3. Return in 4-6 months

## 2018-12-23 DIAGNOSIS — G4733 Obstructive sleep apnea (adult) (pediatric): Secondary | ICD-10-CM | POA: Diagnosis not present

## 2018-12-24 DIAGNOSIS — G4733 Obstructive sleep apnea (adult) (pediatric): Secondary | ICD-10-CM | POA: Diagnosis not present

## 2019-01-02 ENCOUNTER — Ambulatory Visit: Payer: Medicare HMO | Attending: Family Medicine | Admitting: Physical Therapy

## 2019-01-02 ENCOUNTER — Encounter: Payer: Self-pay | Admitting: Physical Therapy

## 2019-01-02 ENCOUNTER — Other Ambulatory Visit: Payer: Self-pay

## 2019-01-02 DIAGNOSIS — M79601 Pain in right arm: Secondary | ICD-10-CM | POA: Diagnosis not present

## 2019-01-02 DIAGNOSIS — G8929 Other chronic pain: Secondary | ICD-10-CM

## 2019-01-02 DIAGNOSIS — M25511 Pain in right shoulder: Secondary | ICD-10-CM | POA: Insufficient documentation

## 2019-01-02 NOTE — Therapy (Signed)
[facility] [address] [place], [place], [address] Phone: [phone]   Fax:  [phone]  Physical Therapy Evaluation  Patient Details  Name: Linda Arnold MRN: [id number] Date of Birth: 1950/05/23 Referring Provider (PT): Renaye RakersVeita Bland, MD   Encounter Date: 01/02/2019  PT End of Session - 01/02/19 1300    Visit Number  1    Number of Visits  12    Date for PT Re-Evaluation  02/13/19    Authorization Type  [employer] MCR    PT Start Time  1145    PT Stop Time  1233    PT Time Calculation (min)  48 min    Activity Tolerance  Patient tolerated treatment well    Behavior During Therapy  [facility] for tasks assessed/performed       Past Medical History:  Diagnosis Date  . Arthritis 1999   knees  . Diabetes mellitus without complication (HCC) 2005  . Dysphagia   . Hyperlipidemia   . Hypertension   . Memory loss     Past Surgical History:  Procedure Laterality Date  . CESAREAN SECTION    . TUBAL LIGATION      There were no vitals filed for this visit.   Subjective Assessment - 01/02/19 1242    Subjective  Pt. reports onset of right shoulder and arm pain beginning this past July following a hospitalization for altered mental status/no mechanism of injury or onset otherwise noted. Her primary symptom is radiating pain extending from right upper trapezius region into right arm and hand with symptoms most noted when lying down to sleep at night. No bowel or bladder changes noted.    Pertinent History  diabetic, history of memory loss    Limitations  Sitting;Other (comment)   disturbed sleep   Patient Stated Goals  "Get shoulder and arm better"    Currently in Pain?  Yes    Pain Score  6     Pain Location  Shoulder    Pain Orientation  Right    Pain Descriptors / Indicators  Aching    Pain Type  Chronic pain    Pain Onset  More than a month ago    Pain Frequency  Intermittent    Aggravating Factors   lying down to sleep is  the worst (supine), sitting can aggravate pain as well    Pain Relieving Factors  no specific eases noted    Effect of Pain on Daily Activities  causes sleep disturbance and limits positional tolerance         [facility] PT Assessment - 01/02/19 0001      Assessment   Medical Diagnosis  Right shoulder and arm pain    Referring Provider (PT)  Renaye RakersVeita Bland, MD    Onset Date/Surgical Date  09/05/18    Hand Dominance  Right    Next MD Visit  01/19/2019    Prior Therapy  none      Precautions   Precautions  None      Restrictions   Weight Bearing Restrictions  No      Balance Screen   Has the patient fallen in the past 6 months  No      Prior Function   Level of Independence  Independent with basic ADLs      Cognition   Overall Cognitive Status  Within Functional Limits for tasks assessed      Observation/Other Assessments   Focus on Therapeutic Outcomes (FOTO)   34% limited  Observation/Other Assessments-Edema    Edema  Circumferential   bilat. forearms 20 cm (checked to due to pt. report edema)     Sensation   Light Touch  Appears Intact   bilat. C5-T1 dermatomes     ROM / Strength   AROM / PROM / Strength  AROM;PROM;Strength      AROM   AROM Assessment Site  Shoulder;Cervical    Right/Left Shoulder  Right;Left    Right Shoulder Flexion  150 Degrees    Right Shoulder ABduction  160 Degrees    Right Shoulder Internal Rotation  --   reach to T5   Right Shoulder External Rotation  --   reach to T2   Left Shoulder Flexion  155 Degrees    Left Shoulder ABduction  160 Degrees    Left Shoulder Internal Rotation  --   reach to T7   Left Shoulder External Rotation  --   reach to T1   Cervical Flexion  60    Cervical Extension  50   increased pain into upper trapezius region on right   Cervical - Right Side Bend  22    Cervical - Left Side Bend  23    Cervical - Right Rotation  52    Cervical - Left Rotation  40      Strength   Overall Strength Comments   Bilateral UE grossly 5/5, right grip 52 lbs., left grip 49 lbs. with grip dynamonometer    Strength Assessment Site  --    Right/Left Shoulder  --    Right/Left Elbow  --      Palpation   Palpation comment  Tightness with tenderness to palpation right upper trapezius region, tightness but minimal tenderness cervical paraspinals and right posterior scapular region      Special Tests   Other special tests  Spurling's (-), ULTT for median, ulnar and radial nerve (-), cervical distraction (-)                Objective measurements completed on examination: See above findings.      [facility] Adult PT Treatment/Exercise - 01/02/19 0001      Exercises   Exercises  Neck;Shoulder      Neck Exercises: Seated   Neck Retraction  10 reps    Neck Retraction Limitations  mod-max cues to avoid shoulder shrug and cervical flexion and extension      Shoulder Exercises: Seated   Other Seated Exercises  scapular retraction x 10 reps      Neck Exercises: Stretches   Other Neck Stretches  HEP instruction and brief practice right upper trapezius and posterior shoulder stretches             PT Education - 01/02/19 1259    Education Details  POC, HEP, potential symptom etiology    Person(s) Educated  Patient    Methods  Explanation;Demonstration;Tactile cues;Verbal cues;Handout    Comprehension  Verbalized understanding;Returned demonstration;Need further instruction;Verbal cues required;Tactile cues required          PT Long Term Goals - 01/02/19 1308      PT LONG TERM GOAL #1   Title  Independent with HEP    Baseline  needs HEP    Time  6    Period  Weeks    Status  New    Target Date  02/13/19      PT LONG TERM GOAL #2   Title  Improve FOTO outcome measure score to 32% or less impairment  Baseline  34% limited    Time  6    Period  Weeks    Status  New    Target Date  02/13/19      PT LONG TERM GOAL #3   Title  Increase bilateral cervical rotation AROM at least  5-10 deg to improve ability to turn head while driving    Baseline  40 deg left, 52 deg right    Time  6    Period  Weeks    Status  New    Target Date  02/13/19      PT LONG TERM GOAL #4   Title  Decrease sleep disturbance symptoms due to right shoulder and arm pain at least 50% or greater from current status    Baseline  significant difficulty with pain up to 9/10    Time  6    Period  Weeks    Status  New    Target Date  02/13/19             Plan - 01/02/19 1300    Clinical Impression Statement  Pt. presents with new onset of right shoulder/upper trapezius and arm pain s/p hospitalization this past Summer. Differential diagnosis could include radiculopathy but special tests during evaluation (-) and unable to reproduce or alter symptoms with cervical ROM. Suspect contributing myofascial etiology to symptoms involving right upper trapezius, posterior scapular region vs. scalenes. No UE weakness or parasthesias otherwise noted. Pt. would benefit from PT to help relieve pain and address associated functional limitations.    Personal Factors and Comorbidities  Comorbidity 2    Comorbidities  diabetic, history altered mental status/memory issues    Examination-Activity Limitations  Sleep;Sit    Stability/Clinical Decision Making  Evolving/Moderate complexity    Clinical Decision Making  Moderate    Rehab Potential  Good    PT Frequency  --   1-2x/week   PT Duration  6 weeks    PT Treatment/Interventions  ADLs/Self Care Home Management;Electrical Stimulation;Moist Heat;Traction;Ultrasound;Cryotherapy;Dry needling;Neuromuscular re-education;Therapeutic activities;Patient/family education;Manual techniques;Taping;Spinal Manipulations    PT Next Visit Plan  review form cervical retractions and check for any centralization response, trial manual vs. mechanical cervical traction, STM right upper trapezius and posterior scapular region, check scalenes, 1st rib mobs, possible trial dry  needling if tolerated, stretches, modalities prn    PT Home Exercise Plan  cervical retractions, upper trap and posterior shoulder stretches, scapular retractions    Consulted and Agree with Plan of Care  Patient       Patient will benefit from skilled therapeutic intervention in order to improve the following deficits and impairments:  Pain, Impaired UE functional use, Decreased range of motion, Increased muscle spasms  Visit Diagnosis: Pain in right arm  Chronic right shoulder pain     Problem List Patient Active Problem List   Diagnosis Date Noted  . 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 Leucocytosis   . Lactic acidosis   . Acute encephalopathy 08/14/2018  . Contact dermatitis 11/16/2012  . Osteoarthritis of right knee 10/29/2012  . Right knee pain 08/08/2012  . Hypertension 02/06/2012  . HEADACHE 06/09/2008  . Diabetes mellitus type 2, noninsulin dependent (HCC) 12/25/2006  . OBESITY, MODERATE 12/25/2006  . 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 TROCHANTERIC BURSITIS, LEFT 12/25/2006  . HYPERCHOLESTEROLEMIA 11/16/2006    Lazarus Gowda, PT, DPT 01/02/19 1:14 PM  [facility] [address] [place], [place], [address] Phone: [phone]   Fax:  [phone]  Name:  Linda Arnold MRN: [id number] Date of Birth: Mar 15, 1950

## 2019-01-09 ENCOUNTER — Ambulatory Visit: Payer: Medicare HMO | Attending: Family Medicine | Admitting: Physical Therapy

## 2019-01-09 ENCOUNTER — Other Ambulatory Visit: Payer: Self-pay

## 2019-01-09 ENCOUNTER — Encounter: Payer: Self-pay | Admitting: Physical Therapy

## 2019-01-09 DIAGNOSIS — M79601 Pain in right arm: Secondary | ICD-10-CM | POA: Insufficient documentation

## 2019-01-09 DIAGNOSIS — G8929 Other chronic pain: Secondary | ICD-10-CM | POA: Diagnosis not present

## 2019-01-09 DIAGNOSIS — M25511 Pain in right shoulder: Secondary | ICD-10-CM | POA: Diagnosis not present

## 2019-01-09 NOTE — Patient Instructions (Signed)
Added Doorway pec stretch 30 sec x3

## 2019-01-09 NOTE — Therapy (Deleted)
[place], [place], [address] Phone: [phone]   Fax:  [phone]  Physical Therapy Evaluation  Patient Details  Name: Linda Arnold MRN: [id number] Date of Birth: 1950/12/27 Referring Provider (PT): Lucianne Lei, MD   Encounter Date: 01/09/2019  PT End of Session - 01/09/19 1517    Visit Number  2    Number of Visits  12    Date for PT Re-Evaluation  02/13/19    Authorization Type  [employer] MCR    PT Start Time  1316    PT Stop Time  1400    PT Time Calculation (min)  44 min    Activity Tolerance  Patient tolerated treatment well    Behavior During Therapy  [facility] for tasks assessed/performed       Past Medical History:  Diagnosis Date  . Arthritis 1999   knees  . Diabetes mellitus without complication ([place]) 4403  . Dysphagia   . Hyperlipidemia   . Hypertension   . Memory loss     Past Surgical History:  Procedure Laterality Date  . CESAREAN SECTION    . TUBAL LIGATION      There were no vitals filed for this visit.   Subjective Assessment - 01/09/19 1512    Subjective  Pt states pain in UE, mostly when shes sleeping. She has been trying to do HEP    Currently in Pain?  Yes    Pain Score  6     Pain Location  Shoulder    Pain Orientation  Right    Pain Descriptors / Indicators  Aching    Pain Type  Chronic pain    Pain Onset  More than a month ago    Pain Frequency  Intermittent                    Objective measurements completed on examination: See above findings.      [facility] Adult PT Treatment/Exercise - 01/09/19 0001      Neck Exercises: Seated   Neck Retraction Limitations  Tried, Unable to achieve correctly    Shoulder Rolls  20 reps    Other Seated Exercise  Scap Squeeze (attempted x20, req max cuing)       Shoulder Exercises: Supine   Horizontal ABduction  10 reps;AROM    Flexion  AROM;15 reps      Shoulder Exercises: Pulleys   Flexion  2 minutes       Shoulder Exercises: Stretch   Corner Stretch  3 reps;30 seconds    Corner Stretch Limitations  45 deg in doorway    Other Shoulder Stretches  Posterior shoulder stretch 30 sec x3;       Manual Therapy   Manual Therapy  Soft tissue mobilization;Passive ROM;Manual Traction    Soft tissue mobilization  STM and IASTM to R UT region    Passive ROM  For R shoulder     Manual Traction  C-spine 10 sec x10;       Neck Exercises: Stretches   Upper Trapezius Stretch  2 reps;30 seconds;Right;Left             PT Education - 01/09/19 1515    Education Details  Reviewed HEP, Revewed posture for ADLS and sitting.    Person(s) Educated  Patient    Methods  Explanation;Demonstration;Tactile cues;Verbal cues;Handout    Comprehension  Verbalized understanding;Verbal cues required;Tactile cues required;Returned demonstration;Need further instruction  PT Long Term Goals - 01/02/19 1308      PT LONG TERM GOAL #1   Title  Independent with HEP    Baseline  needs HEP    Time  6    Period  Weeks    Status  New    Target Date  02/13/19      PT LONG TERM GOAL #2   Title  Improve FOTO outcome measure score to 32% or less impairment    Baseline  34% limited    Time  6    Period  Weeks    Status  New    Target Date  02/13/19      PT LONG TERM GOAL #3   Title  Increase bilateral cervical rotation AROM at least 5-10 deg to improve ability to turn head while driving    Baseline  40 deg left, 52 deg right    Time  6    Period  Weeks    Status  New    Target Date  02/13/19      PT LONG TERM GOAL #4   Title  Decrease sleep disturbance symptoms due to right shoulder and arm pain at least 50% or greater from current status    Baseline  significant difficulty with pain up to 9/10    Time  6    Period  Weeks    Status  New    Target Date  02/13/19             Plan - 01/09/19 1522    Clinical Impression Statement  Pt with significant tightness and soreness in UT region,  addressed with manual today. Pt with much tightness and guarding of UT region with most UE ROM, discussed importance of posture and mechanics with ADLs and IADLs. Manual traction also done for neck for UE pain. Pt with minimal pain in UE during treatment today. Reviewed HEP, pt req. max cuing for all ther ex to perform correctly and to decrease compensation from UTs.Pt unable to achieve active scap squeeze today.    Personal Factors and Comorbidities  Comorbidity 2    Comorbidities  diabetic, history altered mental status/memory issues    Examination-Activity Limitations  Sleep;Sit    Stability/Clinical Decision Making  Evolving/Moderate complexity    Rehab Potential  Good    PT Frequency  --   1-2x/week   PT Duration  6 weeks    PT Treatment/Interventions  ADLs/Self Care Home Management;Electrical Stimulation;Moist Heat;Traction;Ultrasound;Cryotherapy;Dry needling;Neuromuscular re-education;Therapeutic activities;Patient/family education;Manual techniques;Taping;Spinal Manipulations    PT Next Visit Plan  review form cervical retractions and check for any centralization response, trial manual vs. mechanical cervical traction, STM right upper trapezius and posterior scapular region, check scalenes, 1st rib mobs, possible trial dry needling if tolerated, stretches, modalities prn    PT Home Exercise Plan  cervical retractions, upper trap and posterior shoulder stretches, scapular retractions    Consulted and Agree with Plan of Care  Patient       Patient will benefit from skilled therapeutic intervention in order to improve the following deficits and impairments:  Pain, Impaired UE functional use, Decreased range of motion, Increased muscle spasms  Visit Diagnosis: Chronic right shoulder pain  Pain in right arm     Problem List Patient Active Problem List   Diagnosis Date Noted  . 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 Leucocytosis   . Lactic acidosis   . Acute encephalopathy  08/14/2018  . Contact dermatitis 11/16/2012  . Osteoarthritis of  right knee 10/29/2012  . Right knee pain 08/08/2012  . Hypertension 02/06/2012  . HEADACHE 06/09/2008  . Diabetes mellitus type 2, noninsulin dependent (HCC) 12/25/2006  . OBESITY, MODERATE 12/25/2006  . 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 TROCHANTERIC BURSITIS, LEFT 12/25/2006  . HYPERCHOLESTEROLEMIA 11/16/2006    Sedalia Muta 01/09/2019, 3:33 PM  [facility] [address] [place], [place], [address] Phone: [phone]   Fax:  [phone]  Name: Marcelle Hepner MRN: [id number] Date of Birth: 09-Apr-1950

## 2019-01-09 NOTE — Therapy (Signed)
[place], [place], [address] Phone: [phone]   Fax:  [phone]  Physical Therapy Treatment  Patient Details  Name: Linda Arnold MRN: [id number] Date of Birth: September 17, 1950 Referring Provider (PT): Lucianne Lei, MD   Encounter Date: 01/09/2019  PT End of Session - 01/09/19 1517    Visit Number  2    Number of Visits  12    Date for PT Re-Evaluation  02/13/19    Authorization Type  [employer] MCR    PT Start Time  1316    PT Stop Time  1400    PT Time Calculation (min)  44 min    Activity Tolerance  Patient tolerated treatment well    Behavior During Therapy  [facility] for tasks assessed/performed       Past Medical History:  Diagnosis Date  . Arthritis 1999   knees  . Diabetes mellitus without complication ([place]) 7253  . Dysphagia   . Hyperlipidemia   . Hypertension   . Memory loss     Past Surgical History:  Procedure Laterality Date  . CESAREAN SECTION    . TUBAL LIGATION      There were no vitals filed for this visit.  Subjective Assessment - 01/09/19 1512    Subjective  Pt states pain in UE, mostly when shes sleeping. She has been trying to do HEP    Currently in Pain?  Yes    Pain Score  6     Pain Location  Shoulder    Pain Orientation  Right    Pain Descriptors / Indicators  Aching    Pain Type  Chronic pain    Pain Onset  More than a month ago    Pain Frequency  Intermittent                       [facility] Adult PT Treatment/Exercise - 01/09/19 0001      Neck Exercises: Seated   Neck Retraction Limitations  Tried, Unable to achieve correctly    Shoulder Rolls  20 reps    Other Seated Exercise  Scap Squeeze (attempted x20, req max cuing)       Shoulder Exercises: Supine   Horizontal ABduction  10 reps;AROM    Flexion  AROM;15 reps      Shoulder Exercises: Pulleys   Flexion  2 minutes      Shoulder Exercises: Stretch   Corner Stretch  3 reps;30 seconds    Corner Stretch Limitations  45 deg in doorway    Other Shoulder Stretches  Posterior shoulder stretch 30 sec x3;       Manual Therapy   Manual Therapy  Soft tissue mobilization;Passive ROM;Manual Traction    Soft tissue mobilization  STM and IASTM to R UT region    Passive ROM  For R shoulder     Manual Traction  C-spine 10 sec x10;       Neck Exercises: Stretches   Upper Trapezius Stretch  2 reps;30 seconds;Right;Left             PT Education - 01/09/19 1515    Education Details  Reviewed HEP, Revewed posture for ADLS and sitting.    Person(s) Educated  Patient    Methods  Explanation;Demonstration;Tactile cues;Verbal cues;Handout    Comprehension  Verbalized understanding;Verbal cues required;Tactile cues required;Returned demonstration;Need further instruction          PT Long Term Goals - 01/02/19 1308  PT LONG TERM GOAL #1   Title  Independent with HEP    Baseline  needs HEP    Time  6    Period  Weeks    Status  New    Target Date  02/13/19      PT LONG TERM GOAL #2   Title  Improve FOTO outcome measure score to 32% or less impairment    Baseline  34% limited    Time  6    Period  Weeks    Status  New    Target Date  02/13/19      PT LONG TERM GOAL #3   Title  Increase bilateral cervical rotation AROM at least 5-10 deg to improve ability to turn head while driving    Baseline  40 deg left, 52 deg right    Time  6    Period  Weeks    Status  New    Target Date  02/13/19      PT LONG TERM GOAL #4   Title  Decrease sleep disturbance symptoms due to right shoulder and arm pain at least 50% or greater from current status    Baseline  significant difficulty with pain up to 9/10    Time  6    Period  Weeks    Status  New    Target Date  02/13/19            Plan - 01/09/19 1522    Clinical Impression Statement  Pt with significant tightness and soreness in UT region, addressed with manual today. Pt with much tightness and guarding of UT  region with most UE ROM, discussed importance of posture and mechanics with ADLs and IADLs. Manual traction also done for neck for UE pain. Pt with minimal pain in UE during treatment today. Reviewed HEP, pt req. max cuing for all ther ex to perform correctly and to decrease compensation from UTs.Pt unable to achieve active scap squeeze today.    Personal Factors and Comorbidities  Comorbidity 2    Comorbidities  diabetic, history altered mental status/memory issues    Examination-Activity Limitations  Sleep;Sit    Stability/Clinical Decision Making  Evolving/Moderate complexity    Rehab Potential  Good    PT Frequency  --   1-2x/week   PT Duration  6 weeks    PT Treatment/Interventions  ADLs/Self Care Home Management;Electrical Stimulation;Moist Heat;Traction;Ultrasound;Cryotherapy;Dry needling;Neuromuscular re-education;Therapeutic activities;Patient/family education;Manual techniques;Taping;Spinal Manipulations    PT Next Visit Plan  review form cervical retractions and check for any centralization response, trial manual vs. mechanical cervical traction, STM right upper trapezius and posterior scapular region, check scalenes, 1st rib mobs, possible trial dry needling if tolerated, stretches, modalities prn    PT Home Exercise Plan  cervical retractions, upper trap and posterior shoulder stretches, scapular retractions    Consulted and Agree with Plan of Care  Patient       Patient will benefit from skilled therapeutic intervention in order to improve the following deficits and impairments:  Pain, Impaired UE functional use, Decreased range of motion, Increased muscle spasms  Visit Diagnosis: Chronic right shoulder pain  Pain in right arm     Problem List Patient Active Problem List   Diagnosis Date Noted  . 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 Leucocytosis   . Lactic acidosis   . Acute encephalopathy 08/14/2018  . Contact dermatitis 11/16/2012  . Osteoarthritis of right knee  10/29/2012  . Right knee pain 08/08/2012  . Hypertension 02/06/2012  .  HEADACHE 06/09/2008  . Diabetes mellitus type 2, noninsulin dependent (HCC) 12/25/2006  . OBESITY, MODERATE 12/25/2006  . 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 TROCHANTERIC BURSITIS, LEFT 12/25/2006  . HYPERCHOLESTEROLEMIA 11/16/2006    Sedalia MutaLauren Derry Kassel, PT, DPT 3:33 PM  01/09/19    [facility] [address] [place], [place], [address] Phone: [phone]   Fax:  [phone]  Name: Linda Arnold MRN: [id number] Date of Birth: [birth year]

## 2019-01-11 ENCOUNTER — Other Ambulatory Visit: Payer: Self-pay | Admitting: Family Medicine

## 2019-01-11 DIAGNOSIS — Z1231 Encounter for screening mammogram for malignant neoplasm of breast: Secondary | ICD-10-CM

## 2019-01-18 ENCOUNTER — Encounter: Payer: Self-pay | Admitting: Physical Therapy

## 2019-01-18 ENCOUNTER — Other Ambulatory Visit: Payer: Self-pay

## 2019-01-18 ENCOUNTER — Ambulatory Visit: Payer: Medicare HMO | Admitting: Physical Therapy

## 2019-01-18 DIAGNOSIS — G8929 Other chronic pain: Secondary | ICD-10-CM

## 2019-01-18 DIAGNOSIS — M25511 Pain in right shoulder: Secondary | ICD-10-CM | POA: Diagnosis not present

## 2019-01-18 DIAGNOSIS — M79601 Pain in right arm: Secondary | ICD-10-CM

## 2019-01-18 NOTE — Patient Instructions (Addendum)
Resisted Horizontal Abduction: Bilateral    Sit or stand, tubing in both hands, arms out in front. Keeping arms straight, pinch shoulder blades together and stretch arms out. Repeat __10__ times per set. Do __2__ sets per session. Do _1___ sessions per day.  http://orth.exer.us/969   Copyright  VHI. All rights reserved.    NECK TENSION: Assisted Stretch    Reach right arm around head and hold slightly above ear. Gently bring right ear toward right shoulder. Hold position for _3__ breaths (30 sec) Repeat with other arm. Repeat __3_ times, alternating arms. Do __2_ times per day.  Copyright  VHI. All rights reserved.

## 2019-01-18 NOTE — Therapy (Addendum)
[place], [place], [address] Phone: [phone]   Fax:  [phone]  Physical Therapy Treatment/Discharge  Patient Details  Name: Linda Arnold MRN: [id number] Date of Birth: Oct 02, 1950 Referring Provider (PT): Lucianne Lei, MD   Encounter Date: 01/18/2019  PT End of Session - 01/18/19 0838    Visit Number  3    Number of Visits  12    Date for PT Re-Evaluation  02/13/19    Authorization Type  [employer] MCR    PT Start Time  0830    PT Stop Time  0924    PT Time Calculation (min)  54 min    Activity Tolerance  Patient tolerated treatment well    Behavior During Therapy  [facility] for tasks assessed/performed       Past Medical History:  Diagnosis Date  . Arthritis 1999   knees  . Diabetes mellitus without complication ([place]) 7782  . Dysphagia   . Hyperlipidemia   . Hypertension   . Memory loss     Past Surgical History:  Procedure Laterality Date  . CESAREAN SECTION    . TUBAL LIGATION      There were no vitals filed for this visit.  Subjective Assessment - 01/18/19 0830    Subjective  Pain was a 9/10 last night.  Its a 5/10 right now.  Does not take any meds.    Currently in Pain?  Yes    Pain Score  5     Pain Location  Arm    Pain Orientation  Right    Pain Descriptors / Indicators  Aching    Pain Type  Chronic pain    Pain Onset  More than a month ago    Pain Frequency  Intermittent    Aggravating Factors   lying down    Pain Relieving Factors  nothing helps             [facility] Adult PT Treatment/Exercise - 01/18/19 0001      Shoulder Exercises: Supine   Flexion  AROM;Both;15 reps    Shoulder Flexion Weight (lbs)  cane       Shoulder Exercises: Seated   Other Seated Exercises  scapular retraction x 10 reps      Shoulder Exercises: Standing   Horizontal ABduction  Strengthening;Both;15 reps    Theraband Level (Shoulder Horizontal ABduction)  Level 2 (Red)    Flexion   AROM;Both;12 reps    Shoulder Flexion Weight (lbs)  cane     Extension  AROM;Both;10 reps    Extension Weight (lbs)  cane     Retraction  Strengthening;Both;10 reps    Retraction Weight (lbs)  arms pressed back into wall     Other Standing Exercises  above ex done against the wall for posture       Shoulder Exercises: Pulleys   Flexion  2 minutes      Shoulder Exercises: Stretch   Corner Stretch  4 reps;20 seconds    Corner Stretch Limitations  doorway       Moist Heat Therapy   Number Minutes Moist Heat  10 Minutes    Moist Heat Location  Shoulder      Manual Therapy   Soft tissue mobilization  Rt upper trap and posterior cervicals    Passive ROM  For R shoulder     Manual Traction  C-spine 10 sec x10;       Neck Exercises: Stretches   Upper Trapezius Stretch  3 reps;30 seconds             PT Education - 01/18/19 0913    Education Details  posture, cervical spine, referred pain and HEP technique, alignment    Person(s) Educated  Patient    Methods  Explanation;Handout    Comprehension  Verbalized understanding;Verbal cues required;Tactile cues required          PT Long Term Goals - 01/02/19 1308      PT LONG TERM GOAL #1   Title  Independent with HEP    Baseline  needs HEP    Time  6    Period  Weeks    Status  New    Target Date  02/13/19      PT LONG TERM GOAL #2   Title  Improve FOTO outcome measure score to 32% or less impairment    Baseline  34% limited    Time  6    Period  Weeks    Status  New    Target Date  02/13/19      PT LONG TERM GOAL #3   Title  Increase bilateral cervical rotation AROM at least 5-10 deg to improve ability to turn head while driving    Baseline  40 deg left, 52 deg right    Time  6    Period  Weeks    Status  New    Target Date  02/13/19      PT LONG TERM GOAL #4   Title  Decrease sleep disturbance symptoms due to right shoulder and arm pain at least 50% or greater from current status    Baseline  significant  difficulty with pain up to 9/10    Time  6    Period  Weeks    Status  New    Target Date  02/13/19            Plan - 01/18/19 0914    Clinical Impression Statement  Pt continues to have Rt UE pain and tension throughout cervical spine.  She has difficulty engaging scapular muscles and is compensating with upper traps, levator scap.  She has not had any improvement, exercises are not helping her yet at this point. Cont POC for 2-3 more visits to ensure proper technique and to allow integration of principles, new information.    PT Treatment/Interventions  ADLs/Self Care Home Management;Electrical Stimulation;Moist Heat;Traction;Ultrasound;Cryotherapy;Dry needling;Neuromuscular re-education;Therapeutic activities;Patient/family education;Manual techniques;Taping;Spinal Manipulations    PT Next Visit Plan  review form cervical retractions and check for any centralization response, trial manual vs. mechanical cervical traction, STM right upper trapezius and posterior scapular region, check scalenes, 1st rib mobs, possible trial dry needling if tolerated, stretches, modalities prn    PT Home Exercise Plan  cervical retractions, upper trap and posterior shoulder stretches, scapular retractions, red band horiz abd and reissued upper trap stretch    Consulted and Agree with Plan of Care  Patient       Patient will benefit from skilled therapeutic intervention in order to improve the following deficits and impairments:  Pain, Impaired UE functional use, Decreased range of motion, Increased muscle spasms  Visit Diagnosis: Chronic right shoulder pain  Pain in right arm     Problem List Patient Active Problem List   Diagnosis Date Noted  . 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 Leucocytosis   . Lactic acidosis   . Acute encephalopathy 08/14/2018  . Contact dermatitis 11/16/2012  . Osteoarthritis of right knee 10/29/2012  .  Right knee pain 08/08/2012  . Hypertension 02/06/2012  .  HEADACHE 06/09/2008  . Diabetes mellitus type 2, noninsulin dependent ([place]) 12/25/2006  . OBESITY, MODERATE 12/25/2006  . 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 TROCHANTERIC BURSITIS, LEFT 12/25/2006  . HYPERCHOLESTEROLEMIA 11/16/2006    Linda Arnold 01/18/2019, 9:23 AM  [facility] [address] [place], [place], [address] Phone: [phone]   Fax:  [phone]  Name: Linda Arnold MRN: [id number] Date of Birth: [birth year]  Raeford Razor, PT 01/18/19 9:23 AM Phone: [phone] Fax: [phone]  PHYSICAL THERAPY DISCHARGE SUMMARY  Visits from Start of Care: 3  Current functional level related to goals / functional outcomes: Unknown, see above    Remaining deficits: Deficits likely remain, has not been back in several weeks.  Wanted to see MD before continuing.    Education / Equipment: HEP , RICE  Plan: Patient agrees to discharge.  Patient goals were not met. Patient is being discharged due to the patient's request.  ?????    Raeford Razor, PT 02/19/19 4:55 PM Phone: [phone] Fax: [phone]

## 2019-01-21 DIAGNOSIS — E782 Mixed hyperlipidemia: Secondary | ICD-10-CM | POA: Diagnosis not present

## 2019-01-21 DIAGNOSIS — M130 Polyarthritis, unspecified: Secondary | ICD-10-CM | POA: Diagnosis not present

## 2019-01-21 DIAGNOSIS — E1169 Type 2 diabetes mellitus with other specified complication: Secondary | ICD-10-CM | POA: Diagnosis not present

## 2019-01-21 DIAGNOSIS — I10 Essential (primary) hypertension: Secondary | ICD-10-CM | POA: Diagnosis not present

## 2019-01-23 DIAGNOSIS — G4733 Obstructive sleep apnea (adult) (pediatric): Secondary | ICD-10-CM | POA: Diagnosis not present

## 2019-01-24 ENCOUNTER — Ambulatory Visit: Payer: Medicare HMO | Admitting: Physical Therapy

## 2019-01-30 ENCOUNTER — Ambulatory Visit: Payer: Medicare HMO | Admitting: Physical Therapy

## 2019-02-06 ENCOUNTER — Ambulatory Visit: Payer: Medicare HMO | Admitting: Physical Therapy

## 2019-02-06 DIAGNOSIS — M25511 Pain in right shoulder: Secondary | ICD-10-CM | POA: Diagnosis not present

## 2019-02-06 DIAGNOSIS — M5412 Radiculopathy, cervical region: Secondary | ICD-10-CM | POA: Diagnosis not present

## 2019-02-06 DIAGNOSIS — M7581 Other shoulder lesions, right shoulder: Secondary | ICD-10-CM | POA: Diagnosis not present

## 2019-02-19 ENCOUNTER — Encounter: Payer: Self-pay | Admitting: Physical Therapy

## 2019-02-19 DIAGNOSIS — I10 Essential (primary) hypertension: Secondary | ICD-10-CM | POA: Diagnosis not present

## 2019-02-20 DIAGNOSIS — M7581 Other shoulder lesions, right shoulder: Secondary | ICD-10-CM | POA: Diagnosis not present

## 2019-02-20 DIAGNOSIS — M5412 Radiculopathy, cervical region: Secondary | ICD-10-CM | POA: Diagnosis not present

## 2019-02-22 DIAGNOSIS — G4733 Obstructive sleep apnea (adult) (pediatric): Secondary | ICD-10-CM | POA: Diagnosis not present

## 2019-02-22 NOTE — Therapy (Signed)
[place], [place], [address] Phone: [phone]   Fax:  [phone]  Physical Therapy Treatment/Discharge  Patient Details  Name: Linda Arnold MRN: [id number] Date of Birth: 1950/11/27 Referring Provider (PT): Lucianne Lei, MD   Encounter Date: 01/18/2019    Past Medical History:  Diagnosis Date  . Arthritis 1999   knees  . Diabetes mellitus without complication ([place]) 0923  . Dysphagia   . Hyperlipidemia   . Hypertension   . Memory loss     Past Surgical History:  Procedure Laterality Date  . CESAREAN SECTION    . TUBAL LIGATION      There were no vitals filed for this visit.                                 PT Long Term Goals - 01/02/19 1308      PT LONG TERM GOAL #1   Title  Independent with HEP    Baseline  needs HEP    Time  6    Period  Weeks    Status  New    Target Date  02/13/19      PT LONG TERM GOAL #2   Title  Improve FOTO outcome measure score to 32% or less impairment    Baseline  34% limited    Time  6    Period  Weeks    Status  New    Target Date  02/13/19      PT LONG TERM GOAL #3   Title  Increase bilateral cervical rotation AROM at least 5-10 deg to improve ability to turn head while driving    Baseline  40 deg left, 52 deg right    Time  6    Period  Weeks    Status  New    Target Date  02/13/19      PT LONG TERM GOAL #4   Title  Decrease sleep disturbance symptoms due to right shoulder and arm pain at least 50% or greater from current status    Baseline  significant difficulty with pain up to 9/10    Time  6    Period  Weeks    Status  New    Target Date  02/13/19              Patient will benefit from skilled therapeutic intervention in order to improve the following deficits and impairments:  Pain, Impaired UE functional use, Decreased range of motion, Increased muscle spasms  Visit Diagnosis: Chronic right  shoulder pain  Pain in right arm     Problem List Patient Active Problem List   Diagnosis Date Noted  . 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 Leucocytosis   . Lactic acidosis   . Acute encephalopathy 08/14/2018  . Contact dermatitis 11/16/2012  . Osteoarthritis of right knee 10/29/2012  . Right knee pain 08/08/2012  . Hypertension 02/06/2012  . HEADACHE 06/09/2008  . Diabetes mellitus type 2, noninsulin dependent ([place]) 12/25/2006  . OBESITY, MODERATE 12/25/2006  . 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 TROCHANTERIC BURSITIS, LEFT 12/25/2006  . HYPERCHOLESTEROLEMIA 11/16/2006       PHYSICAL THERAPY DISCHARGE SUMMARY  Visits from Start of Care: 3  Current functional level related to goals / functional outcomes: Patient did not return for further therapy after last visit 01/18/2019 with no improvement noted at the time.   Remaining deficits: Continued  shoulder pain and associated functional limitations.   Education / Equipment: NA Plan:                                                    Patient goals were not met. Patient is being discharged due to not returning since the last visit.  ?????             Beaulah Dinning, PT, DPT 02/22/19 8:25 AM  [facility] [address] [place], [place], [address] Phone: [phone]   Fax:  [phone]  Name: Roann Merk MRN: [id number] Date of Birth: 21-Sep-1950

## 2019-03-05 DIAGNOSIS — G4733 Obstructive sleep apnea (adult) (pediatric): Secondary | ICD-10-CM | POA: Diagnosis not present

## 2019-03-06 ENCOUNTER — Ambulatory Visit
Admission: RE | Admit: 2019-03-06 | Discharge: 2019-03-06 | Disposition: A | Discharge status: Home / Self Care | Payer: Medicare HMO | Source: Ambulatory Visit | Attending: Family Medicine | Admitting: Family Medicine

## 2019-03-06 ENCOUNTER — Other Ambulatory Visit: Payer: Self-pay

## 2019-03-06 DIAGNOSIS — Z1231 Encounter for screening mammogram for malignant neoplasm of breast: Secondary | ICD-10-CM

## 2019-03-12 DIAGNOSIS — M5412 Radiculopathy, cervical region: Secondary | ICD-10-CM | POA: Diagnosis not present

## 2019-03-12 DIAGNOSIS — M542 Cervicalgia: Secondary | ICD-10-CM | POA: Diagnosis not present

## 2019-03-12 DIAGNOSIS — M50322 Other cervical disc degeneration at C5-C6 level: Secondary | ICD-10-CM | POA: Diagnosis not present

## 2019-03-25 DIAGNOSIS — G4733 Obstructive sleep apnea (adult) (pediatric): Secondary | ICD-10-CM | POA: Diagnosis not present

## 2019-03-27 DIAGNOSIS — M25511 Pain in right shoulder: Secondary | ICD-10-CM | POA: Diagnosis not present

## 2019-03-27 DIAGNOSIS — M7581 Other shoulder lesions, right shoulder: Secondary | ICD-10-CM | POA: Diagnosis not present

## 2019-04-02 DIAGNOSIS — M25511 Pain in right shoulder: Secondary | ICD-10-CM | POA: Diagnosis not present

## 2019-04-04 DIAGNOSIS — H1045 Other chronic allergic conjunctivitis: Secondary | ICD-10-CM | POA: Diagnosis not present

## 2019-04-04 DIAGNOSIS — H2513 Age-related nuclear cataract, bilateral: Secondary | ICD-10-CM | POA: Diagnosis not present

## 2019-04-04 DIAGNOSIS — E7800 Pure hypercholesterolemia, unspecified: Secondary | ICD-10-CM | POA: Diagnosis not present

## 2019-04-04 DIAGNOSIS — H04123 Dry eye syndrome of bilateral lacrimal glands: Secondary | ICD-10-CM | POA: Diagnosis not present

## 2019-04-04 DIAGNOSIS — H43393 Other vitreous opacities, bilateral: Secondary | ICD-10-CM | POA: Diagnosis not present

## 2019-04-04 DIAGNOSIS — E119 Type 2 diabetes mellitus without complications: Secondary | ICD-10-CM | POA: Diagnosis not present

## 2019-04-04 DIAGNOSIS — H50111 Monocular exotropia, right eye: Secondary | ICD-10-CM | POA: Diagnosis not present

## 2019-04-04 DIAGNOSIS — Z0100 Encounter for examination of eyes and vision without abnormal findings: Secondary | ICD-10-CM | POA: Diagnosis not present

## 2019-04-05 DIAGNOSIS — M25511 Pain in right shoulder: Secondary | ICD-10-CM | POA: Diagnosis not present

## 2019-04-25 DIAGNOSIS — G4733 Obstructive sleep apnea (adult) (pediatric): Secondary | ICD-10-CM | POA: Diagnosis not present

## 2019-05-20 ENCOUNTER — Encounter: Payer: Self-pay | Admitting: Neurology

## 2019-05-20 ENCOUNTER — Ambulatory Visit: Payer: Medicare HMO | Admitting: Neurology

## 2019-05-20 ENCOUNTER — Other Ambulatory Visit: Payer: Self-pay

## 2019-05-20 ENCOUNTER — Telehealth: Payer: Self-pay | Admitting: Neurology

## 2019-05-20 VITALS — BP 137/88 | HR 75 | Temp 97.3°F | Ht 62.5 in | Wt 186.5 lb

## 2019-05-20 DIAGNOSIS — R413 Other amnesia: Secondary | ICD-10-CM

## 2019-05-20 DIAGNOSIS — G454 Transient global amnesia: Secondary | ICD-10-CM | POA: Diagnosis not present

## 2019-05-20 HISTORY — DX: Other amnesia: R41.3

## 2019-05-20 NOTE — Progress Notes (Signed)
PATIENT: Linda Arnold DOB: [birth year]  REASON FOR VISIT: follow up HISTORY FROM: patient  HISTORY OF PRESENT ILLNESS: Today 05/20/19  HISTORY  Linda Arnold is a 69 year old female, seen in request by her primary care physician Dr. Parke Simmers, Adrian Saran for evaluation of memory loss, initial evaluation was on September 18, 2018.  I have reviewed and summarized the referring note from the referring physician.  She had a past medical history of diabetes, hyperlipidemia, she used to work at [employer] job, did daycare work for about 3 years, she lives alone, still drives, around 2008, she noticed gradual onset memory loss, initially forget people's name, telephone number, gradually getting worse, she forgot what she would have for breath first quickly  She was admitted to the hospital on August 14, 2018, while her sister talked with her, she was noted to repeating questions, confused, her sister came over to check on her, realize she was very confused, did not know what was going on, her sister called EMS, she was admitted to the hospital, she cannot remember the ride to hospital, woke up few hours later realized that she is in hospital.  I personally reviewed the brain on August 15, 2018, punctuated acute infarction at the right precentral gyri, otherwise there was generalized atrophy, especially at the left perisylvian fissure, CT angiogram of neck and brain showed no large vessel disease.  Laboratory evaluations showed normal lipid profile, A1c was 6.1, BMP showed calcium 8.4, normal CBC, Echocardiogram on August 15 2018, showed ejection fraction 60 to 65%,  UPDATE May 20 2019: She is overall doing very well, there is no longer confusional episode, she continue complains of intermittent mild short-term memory loss, she gave me the example, while talking with her friend, she could not think of what breakfast she had, but she can recall it few minutes later.  EEG was normal in July 2020,  Update  December 20, 2018 SS: She had a normal EEG in July 2020.  She has not had any further episodes of amnesia or prolonged memory loss.  She indicates she continues to report trouble with her short-term memory. Specifically, she records her daily food intake, often forgets what she ate for meals.  She lives alone, does her own ADLs, pays her bills, and drives a car without difficulty.  She walks 30 minutes daily.  Within the last month she was diagnosed with obstructive sleep apnea, has now started CPAP.  She presents today for follow-up unaccompanied.  She does report that she has noticed memory loss for the last 3 years. No family history of memory loss.   TSH, RPR, Vitamin B12 were normal. I personally reviewed MRI of the brain in June 2020, mild supratentorium small vessel disease there was no acute abnormality.  She has obstructive sleep apnea, faithfully using her CPAP machine every night, exercise regularly, drink at least 6 cups of water daily.  She drives to clinic today by memory, is able to handle her household bills without any difficulty.  REVIEW OF SYSTEMS: Out of a complete 14 system review of symptoms, the patient complains only of the following symptoms, and all other reviewed systems are negative.  As above  ALLERGIES: Allergies  Allergen Reactions  . Betadine [Povidone Iodine] Rash  . Lisinopril Rash    HOME MEDICATIONS: Outpatient Medications Prior to Visit  Medication Sig Dispense Refill  . Calcium Citrate (CITRACAL PO) Take 1 tablet by mouth daily.    Marland Kitchen losartan (COZAAR) 25 MG tablet Take 25  mg by mouth daily.    . metFORMIN (GLUCOPHAGE) 500 MG tablet Take 500 mg by mouth 2 (two) times a day.     . mirabegron ER (MYRBETRIQ) 50 MG TB24 tablet Take 50 mg by mouth daily.    . pravastatin (PRAVACHOL) 40 MG tablet Take 40 mg by mouth daily.     No facility-administered medications prior to visit.    PAST MEDICAL HISTORY: Past Medical History:  Diagnosis Date  . Arthritis  1999   knees  . Diabetes mellitus without complication ([place]) 4098  . Dysphagia   . Hyperlipidemia   . Hypertension   . Memory loss     PAST SURGICAL HISTORY: Past Surgical History:  Procedure Laterality Date  . CESAREAN SECTION    . TUBAL LIGATION      FAMILY HISTORY: Family History  Problem Relation Age of Onset  . Diabetes Mother        died at age 49  . Congestive Heart Failure Mother   . Diabetes Sister   . Hyperlipidemia Sister   . Hypertension Sister   . Cancer Brother 80       Lung cancer x2 brothers  . Hypertension Brother   . Other Father        died in 7 from not being able to "pass his urine"    SOCIAL HISTORY: Social History   Socioeconomic History  . Marital status: Widowed    Spouse name: Not on file  . Number of children: 1  . Years of education: some college  . Highest education level: Not on file  Occupational History  . Occupation: retired  Tobacco Use  . Smoking status: Never Smoker  . Smokeless tobacco: Never Used  Substance and Sexual Activity  . Alcohol use: Never  . Drug use: No  . Sexual activity: Not Currently  Other Topics Concern  . Not on file  Social History Narrative   Widowed and unemployed. Attended some college. Walks infrequently for exercise. No tobacco or drug use. Previously went to [employer] until 2013.      Lives alone.   No caffeine use.   Right-handed.   Social Determinants of Health   Financial Resource Strain:   . Difficulty of Paying Living Expenses:   Food Insecurity:   . Worried About Charity fundraiser in the Last Year:   . Arboriculturist in the Last Year:   Transportation Needs:   . Film/video editor (Medical):   Marland Kitchen Lack of Transportation (Non-Medical):   Physical Activity:   . Days of Exercise per Week:   . Minutes of Exercise per Session:   Stress:   . Feeling of Stress :   Social Connections:   . Frequency of Communication with Friends and Family:   . Frequency of Social  Gatherings with Friends and Family:   . Attends Religious Services:   . Active Member of Clubs or Organizations:   . Attends Archivist Meetings:   Marland Kitchen Marital Status:   Intimate Partner Violence:   . Fear of Current or Ex-Partner:   . Emotionally Abused:   Marland Kitchen Physically Abused:   . Sexually Abused:       PHYSICAL EXAM  Vitals:   05/20/19 1436  BP: 137/88  Pulse: 75  Temp: (!) 97.3 F (36.3 C)  Weight: 186 lb 8 oz (84.6 kg)  Height: 5' 2.5" (1.588 m)   Body mass index is 33.57 kg/m.  Generalized: Well developed, in no acute  distress, well-appearing MMSE - Mini Mental State Exam 05/20/2019 12/20/2018 09/18/2018  Orientation to time 5 5 5   Orientation to Place 5 5 5   Registration 3 3 3   Attention/ Calculation 5 5 5   Recall 3 3 2   Language- name 2 objects 2 2 2   Language- repeat 1 1 1   Language- follow 3 step command 3 3 3   Language- read & follow direction 1 1 1   Write a sentence 1 1 1   Copy design 1 1 1   Total score 30 30 29   animal naming 9  Neurological examination  Mentation: Alert oriented to time, place, history taking. Follows all commands speech and language fluent Cranial nerve II-XII: Pupils were equal round reactive to light. Extraocular movements were full, visual field were full on confrontational test. Facial sensation and strength were normal. Head turning and shoulder shrug  were normal and symmetric. Motor: The motor testing reveals 5 over 5 strength of all 4 extremities. Good symmetric motor tone is noted throughout.  Sensory: Sensory testing is intact to soft touch on all 4 extremities. No evidence of extinction is noted.  Coordination: Cerebellar testing reveals good finger-nose-finger and heel-to-shin bilaterally.  Gait and station: Gait is normal. Tandem gait is normal. Romberg is negative. No drift is seen.  Reflexes: Deep tendon reflexes are symmetric and normal bilaterally.   DIAGNOSTIC DATA (LABS, IMAGING, TESTING) - I reviewed  patient records, labs, notes, testing and imaging myself where available.  Lab Results  Component Value Date   WBC 6.9 08/16/2018   HGB 12.1 08/16/2018   HCT 36.8 08/16/2018   MCV 92.0 08/16/2018   PLT 173 08/16/2018      Component Value Date/Time   NA 141 08/16/2018 0319   K 3.8 08/16/2018 0319   CL 109 08/16/2018 0319   CO2 24 08/16/2018 0319   GLUCOSE 92 08/16/2018 0319   BUN 15 08/16/2018 0319   CREATININE 0.62 08/16/2018 0319   CREATININE 0.76 02/18/2013 0830   CALCIUM 8.4 (L) 08/16/2018 0319   PROT 6.5 08/15/2018 0002   ALBUMIN 3.6 08/15/2018 0002   AST 22 08/15/2018 0002   ALT 19 08/15/2018 0002   ALKPHOS 59 08/15/2018 0002   BILITOT 0.9 08/15/2018 0002   GFRNONAA >60 08/16/2018 0319   GFRAA >60 08/16/2018 0319   Lab Results  Component Value Date   CHOL 160 08/16/2018   HDL 58 08/16/2018   LDLCALC 94 08/16/2018   LDLDIRECT 112 (H) 10/29/2012   TRIG 42 08/16/2018   CHOLHDL 2.8 08/16/2018   Lab Results  Component Value Date   HGBA1C 6.1 (H) 08/16/2018   Lab Results  Component Value Date   VITAMINB12 522 09/18/2018   Lab Results  Component Value Date   TSH 2.840 09/18/2018   ASSESSMENT AND PLAN 69 y.o. year old female   1.  Mild cognitive impairment 2.  Transient global amnesia August 14, 2018  Mini-Mental Status Examination is 30 out of 30 today  I encouraged her to continue moderate exercise,  Optimize control of her hypertension, diabetes, hyperlipidemia,  ASA 81mg  daily  10/16/2018, M.D. Ph.D.  [facility] [address] [place], 10/31/2012 10/16/2018 Phone: [phone] Fax:      [phone]

## 2019-05-20 NOTE — Telephone Encounter (Signed)
Please advise her taking baby aspirin 81 mg daily because of her previous history of transient global amnesia, punctuated small infarction at the right precentral gyrus, multiple vascular risk factors, aging, hypertension, hyperlipidemia, diabetes.

## 2019-05-21 NOTE — Telephone Encounter (Signed)
I spoke to the patient and she verbalized understanding.to add aspirin 81mg , one tablet daily. She also went ahead and scheduled her yearly follow up while we were on the phone.

## 2019-05-23 DIAGNOSIS — G4733 Obstructive sleep apnea (adult) (pediatric): Secondary | ICD-10-CM | POA: Diagnosis not present

## 2019-06-03 DIAGNOSIS — G4733 Obstructive sleep apnea (adult) (pediatric): Secondary | ICD-10-CM | POA: Diagnosis not present

## 2019-06-20 DIAGNOSIS — M130 Polyarthritis, unspecified: Secondary | ICD-10-CM | POA: Diagnosis not present

## 2019-06-20 DIAGNOSIS — F064 Anxiety disorder due to known physiological condition: Secondary | ICD-10-CM | POA: Diagnosis not present

## 2019-06-20 DIAGNOSIS — E1169 Type 2 diabetes mellitus with other specified complication: Secondary | ICD-10-CM | POA: Diagnosis not present

## 2019-06-20 DIAGNOSIS — G4730 Sleep apnea, unspecified: Secondary | ICD-10-CM | POA: Diagnosis not present

## 2019-06-20 DIAGNOSIS — E785 Hyperlipidemia, unspecified: Secondary | ICD-10-CM | POA: Diagnosis not present

## 2019-06-20 DIAGNOSIS — R3915 Urgency of urination: Secondary | ICD-10-CM | POA: Diagnosis not present

## 2019-06-20 DIAGNOSIS — I10 Essential (primary) hypertension: Secondary | ICD-10-CM | POA: Diagnosis not present

## 2019-06-23 DIAGNOSIS — G4733 Obstructive sleep apnea (adult) (pediatric): Secondary | ICD-10-CM | POA: Diagnosis not present

## 2019-07-11 DIAGNOSIS — I10 Essential (primary) hypertension: Secondary | ICD-10-CM | POA: Diagnosis not present

## 2019-07-11 DIAGNOSIS — M130 Polyarthritis, unspecified: Secondary | ICD-10-CM | POA: Diagnosis not present

## 2019-07-11 DIAGNOSIS — F064 Anxiety disorder due to known physiological condition: Secondary | ICD-10-CM | POA: Diagnosis not present

## 2019-07-11 DIAGNOSIS — E1169 Type 2 diabetes mellitus with other specified complication: Secondary | ICD-10-CM | POA: Diagnosis not present

## 2019-07-17 ENCOUNTER — Ambulatory Visit
Admission: RE | Admit: 2019-07-17 | Discharge: 2019-07-17 | Disposition: A | Discharge status: Home / Self Care | Payer: Medicare HMO | Source: Ambulatory Visit | Attending: Family Medicine | Admitting: Family Medicine

## 2019-07-17 ENCOUNTER — Other Ambulatory Visit: Payer: Self-pay | Admitting: Family Medicine

## 2019-07-17 ENCOUNTER — Other Ambulatory Visit: Payer: Self-pay

## 2019-07-17 DIAGNOSIS — R0789 Other chest pain: Secondary | ICD-10-CM | POA: Diagnosis not present

## 2019-07-17 DIAGNOSIS — M47816 Spondylosis without myelopathy or radiculopathy, lumbar region: Secondary | ICD-10-CM | POA: Diagnosis not present

## 2019-07-17 DIAGNOSIS — R52 Pain, unspecified: Secondary | ICD-10-CM

## 2019-07-17 DIAGNOSIS — M533 Sacrococcygeal disorders, not elsewhere classified: Secondary | ICD-10-CM | POA: Diagnosis not present

## 2019-07-23 DIAGNOSIS — G4733 Obstructive sleep apnea (adult) (pediatric): Secondary | ICD-10-CM | POA: Diagnosis not present

## 2019-08-23 DIAGNOSIS — G4733 Obstructive sleep apnea (adult) (pediatric): Secondary | ICD-10-CM | POA: Diagnosis not present

## 2019-09-04 DIAGNOSIS — Z7984 Long term (current) use of oral hypoglycemic drugs: Secondary | ICD-10-CM | POA: Diagnosis not present

## 2019-09-04 DIAGNOSIS — E1100 Type 2 diabetes mellitus with hyperosmolarity without nonketotic hyperglycemic-hyperosmolar coma (NKHHC): Secondary | ICD-10-CM | POA: Diagnosis not present

## 2019-09-04 DIAGNOSIS — E785 Hyperlipidemia, unspecified: Secondary | ICD-10-CM | POA: Diagnosis not present

## 2019-09-04 DIAGNOSIS — I10 Essential (primary) hypertension: Secondary | ICD-10-CM | POA: Diagnosis not present

## 2019-09-10 DIAGNOSIS — E1169 Type 2 diabetes mellitus with other specified complication: Secondary | ICD-10-CM | POA: Diagnosis not present

## 2019-09-10 DIAGNOSIS — Z0000 Encounter for general adult medical examination without abnormal findings: Secondary | ICD-10-CM | POA: Diagnosis not present

## 2019-09-10 DIAGNOSIS — E782 Mixed hyperlipidemia: Secondary | ICD-10-CM | POA: Diagnosis not present

## 2019-09-10 DIAGNOSIS — M130 Polyarthritis, unspecified: Secondary | ICD-10-CM | POA: Diagnosis not present

## 2019-09-10 DIAGNOSIS — I10 Essential (primary) hypertension: Secondary | ICD-10-CM | POA: Diagnosis not present

## 2019-09-16 DIAGNOSIS — M25552 Pain in left hip: Secondary | ICD-10-CM | POA: Diagnosis not present

## 2019-09-22 DIAGNOSIS — G4733 Obstructive sleep apnea (adult) (pediatric): Secondary | ICD-10-CM | POA: Diagnosis not present

## 2019-09-27 DIAGNOSIS — G4733 Obstructive sleep apnea (adult) (pediatric): Secondary | ICD-10-CM | POA: Diagnosis not present

## 2019-09-30 ENCOUNTER — Other Ambulatory Visit: Payer: Self-pay

## 2019-09-30 ENCOUNTER — Encounter: Payer: Self-pay | Admitting: Podiatry

## 2019-09-30 ENCOUNTER — Ambulatory Visit: Payer: Medicare HMO | Admitting: Podiatry

## 2019-09-30 DIAGNOSIS — L84 Corns and callosities: Secondary | ICD-10-CM

## 2019-09-30 DIAGNOSIS — M2041 Other hammer toe(s) (acquired), right foot: Secondary | ICD-10-CM

## 2019-09-30 DIAGNOSIS — M21621 Bunionette of right foot: Secondary | ICD-10-CM

## 2019-09-30 DIAGNOSIS — M2042 Other hammer toe(s) (acquired), left foot: Secondary | ICD-10-CM

## 2019-09-30 DIAGNOSIS — E119 Type 2 diabetes mellitus without complications: Secondary | ICD-10-CM | POA: Diagnosis not present

## 2019-09-30 DIAGNOSIS — M79671 Pain in right foot: Secondary | ICD-10-CM | POA: Diagnosis not present

## 2019-09-30 DIAGNOSIS — M21612 Bunion of left foot: Secondary | ICD-10-CM

## 2019-09-30 DIAGNOSIS — M2012 Hallux valgus (acquired), left foot: Secondary | ICD-10-CM

## 2019-09-30 NOTE — Progress Notes (Signed)
  Subjective:  Patient ID: Linda Arnold, female    DOB: 1950-09-06,  MRN: [id number]  Chief Complaint  Patient presents with  . Foot Problem    possible callouses- pt requesting to get looked at (R) due to painful when stepping or applying pressure    69 y.o. female presents with the above complaint. History confirmed with patient.  She is unsure if maybe she stepped on something, wonders if it would be glass or splinter.  She does not think it was glass that she was by her self and has not broken anything recently.  Does not recall a clear incident with a puncture wound.  She has diabetes and has an A1c in the "6 something range"  Objective:  Physical Exam: warm, good capillary refill, no trophic changes or ulcerative lesions, normal DP and PT pulses and normal sensory exam. Left Foot: Hallux valgus with bunion present, semireducible hammertoes present Right Foot: Mild hallux valgus with bunion present, semireducible hammertoes present, tailor's bunion present, hyperkeratosis submet 2-3, submet 5, submet 5 is very painful    Assessment:   1. Pain in right foot   2. Callus of foot   3. Diabetes mellitus type 2, noninsulin dependent (HCC)   4. Hammertoe of left foot   5. Hammertoe of right foot   6. Hallux valgus with bunions, left   7. Tailor's bunionette, right      Plan:  Patient was evaluated and treated and all questions answered.  Patient educated on diabetes. Discussed proper diabetic foot care and discussed risks and complications of disease. Educated patient in depth on reasons to return to the office immediately should he/she discover anything concerning or new on the feet. All questions answered. Discussed proper shoes as well.  She would benefit from diabetic shoes due to her hyperkeratotic preulcerative lesions and foot deformities.  All symptomatic hyperkeratoses were safely debrided with a sterile #15 blade to patient's level of comfort without incident. We  discussed preventative and palliative care of these lesions including supportive and accommodative shoegear, padding, prefabricated and custom molded accommodative orthoses, use of a pumice stone and lotions/creams daily.     Return in about 6 weeks (around 11/11/2019).

## 2019-10-18 ENCOUNTER — Other Ambulatory Visit: Payer: Self-pay

## 2019-10-18 ENCOUNTER — Other Ambulatory Visit: Payer: Medicare HMO | Admitting: Orthotics

## 2019-10-23 DIAGNOSIS — M25552 Pain in left hip: Secondary | ICD-10-CM | POA: Diagnosis not present

## 2019-10-23 DIAGNOSIS — G4733 Obstructive sleep apnea (adult) (pediatric): Secondary | ICD-10-CM | POA: Diagnosis not present

## 2019-11-23 DIAGNOSIS — G4733 Obstructive sleep apnea (adult) (pediatric): Secondary | ICD-10-CM | POA: Diagnosis not present

## 2019-12-05 ENCOUNTER — Ambulatory Visit (INDEPENDENT_AMBULATORY_CARE_PROVIDER_SITE_OTHER): Payer: Medicare HMO | Admitting: Podiatry

## 2019-12-05 ENCOUNTER — Other Ambulatory Visit: Payer: Self-pay

## 2019-12-05 DIAGNOSIS — M2042 Other hammer toe(s) (acquired), left foot: Secondary | ICD-10-CM | POA: Diagnosis not present

## 2019-12-05 DIAGNOSIS — M79671 Pain in right foot: Secondary | ICD-10-CM | POA: Diagnosis not present

## 2019-12-05 DIAGNOSIS — L84 Corns and callosities: Secondary | ICD-10-CM

## 2019-12-05 DIAGNOSIS — M79674 Pain in right toe(s): Secondary | ICD-10-CM | POA: Diagnosis not present

## 2019-12-05 DIAGNOSIS — M79675 Pain in left toe(s): Secondary | ICD-10-CM

## 2019-12-05 DIAGNOSIS — B351 Tinea unguium: Secondary | ICD-10-CM

## 2019-12-05 DIAGNOSIS — E119 Type 2 diabetes mellitus without complications: Secondary | ICD-10-CM

## 2019-12-05 DIAGNOSIS — M2041 Other hammer toe(s) (acquired), right foot: Secondary | ICD-10-CM | POA: Diagnosis not present

## 2019-12-05 NOTE — Progress Notes (Signed)
°  Subjective:  Patient ID: Linda Arnold, female    DOB: 11-Aug-1950,  MRN: [id number]  Chief Complaint  Patient presents with   Foot Pain       right foot pain f/u    69 y.o. female presents with the above complaint. History confirmed with patient.  No new issues since last visit.  The calluses have returned.  The nails are painful.  Objective:  Physical Exam: warm, good capillary refill, no trophic changes or ulcerative lesions, normal DP and PT pulses and normal sensory exam.  Onychomycosis x10 Left Foot: Hallux valgus with bunion present, semireducible hammertoes present Right Foot: Mild hallux valgus with bunion present, semireducible hammertoes present, tailor's bunion present, hyperkeratosis submet 2-3, submet 5, submet 5 is very painful    Assessment:   1. Pain in right foot   2. Callus of foot   3. Hammertoe of left foot   4. Hammertoe of right foot   5. Onychomycosis   6. Pain due to onychomycosis of toenails of both feet   7. Diabetes mellitus type 2, noninsulin dependent (HCC)      Plan:  Patient was evaluated and treated and all questions answered.  Patient educated on diabetes. Discussed proper diabetic foot care and discussed risks and complications of disease. Educated patient in depth on reasons to return to the office immediately should he/she discover anything concerning or new on the feet. All questions answered. Discussed proper shoes as well.  She would benefit from diabetic shoes due to her hyperkeratotic preulcerative lesions and foot deformities.  All symptomatic hyperkeratoses were safely debrided with a sterile #15 blade to patient's level of comfort without incident. We discussed preventative and palliative care of these lesions including supportive and accommodative shoegear, padding, prefabricated and custom molded accommodative orthoses, use of a pumice stone and lotions/creams daily.  Discussed the etiology and treatment options for the condition  in detail with the patient. Educated patient on the topical and oral treatment options for mycotic nails. Recommended debridement of the nails today. Sharp and mechanical debridement performed of all painful and mycotic nails today. Nails debrided in length and thickness using a nail nipper and a mechanical burr to level of comfort. Discussed treatment options including appropriate shoe gear. Follow up as needed for painful nails.       No follow-ups on file.

## 2019-12-23 DIAGNOSIS — G4733 Obstructive sleep apnea (adult) (pediatric): Secondary | ICD-10-CM | POA: Diagnosis not present

## 2019-12-26 DIAGNOSIS — G4733 Obstructive sleep apnea (adult) (pediatric): Secondary | ICD-10-CM | POA: Diagnosis not present

## 2020-01-10 DIAGNOSIS — G4730 Sleep apnea, unspecified: Secondary | ICD-10-CM | POA: Diagnosis not present

## 2020-01-10 DIAGNOSIS — Z23 Encounter for immunization: Secondary | ICD-10-CM | POA: Diagnosis not present

## 2020-01-10 DIAGNOSIS — E1169 Type 2 diabetes mellitus with other specified complication: Secondary | ICD-10-CM | POA: Diagnosis not present

## 2020-01-10 DIAGNOSIS — E782 Mixed hyperlipidemia: Secondary | ICD-10-CM | POA: Diagnosis not present

## 2020-01-10 DIAGNOSIS — G4700 Insomnia, unspecified: Secondary | ICD-10-CM | POA: Diagnosis not present

## 2020-01-10 DIAGNOSIS — I10 Essential (primary) hypertension: Secondary | ICD-10-CM | POA: Diagnosis not present

## 2020-01-14 ENCOUNTER — Other Ambulatory Visit: Payer: Self-pay | Admitting: Family Medicine

## 2020-01-14 DIAGNOSIS — Z1231 Encounter for screening mammogram for malignant neoplasm of breast: Secondary | ICD-10-CM

## 2020-01-23 DIAGNOSIS — G4733 Obstructive sleep apnea (adult) (pediatric): Secondary | ICD-10-CM | POA: Diagnosis not present

## 2020-02-14 ENCOUNTER — Other Ambulatory Visit: Payer: Self-pay

## 2020-02-14 ENCOUNTER — Ambulatory Visit (INDEPENDENT_AMBULATORY_CARE_PROVIDER_SITE_OTHER): Payer: Medicare HMO | Admitting: Podiatry

## 2020-02-14 DIAGNOSIS — M79671 Pain in right foot: Secondary | ICD-10-CM

## 2020-02-14 DIAGNOSIS — M79675 Pain in left toe(s): Secondary | ICD-10-CM

## 2020-02-14 DIAGNOSIS — L84 Corns and callosities: Secondary | ICD-10-CM

## 2020-02-14 DIAGNOSIS — E119 Type 2 diabetes mellitus without complications: Secondary | ICD-10-CM | POA: Diagnosis not present

## 2020-02-14 DIAGNOSIS — B351 Tinea unguium: Secondary | ICD-10-CM

## 2020-02-14 DIAGNOSIS — M79674 Pain in right toe(s): Secondary | ICD-10-CM

## 2020-02-17 NOTE — Progress Notes (Signed)
  Subjective:  Patient ID: Linda Arnold, female    DOB: May 29, 1950,  MRN: [id number]  Chief Complaint  Patient presents with  . Nail Problem    3 MONTH NAIL TRIM   . Callouses    In need of callus shave down    69 y.o. female presents with the above complaint. History confirmed with patient.  No new issues since last visit.  The calluses have returned.  The nails are painful.  Objective:  Physical Exam: warm, good capillary refill, no trophic changes or ulcerative lesions, normal DP and PT pulses and normal sensory exam.  Onychomycosis x10 Left Foot: Hallux valgus with bunion present, semireducible hammertoes present Right Foot: Mild hallux valgus with bunion present, semireducible hammertoes present, tailor's bunion present, hyperkeratosis submet 2-3, submet 5, submet 5 is very painful    Assessment:   No diagnosis found.   Plan:  Patient was evaluated and treated and all questions answered.  Patient educated on diabetes. Discussed proper diabetic foot care and discussed risks and complications of disease. Educated patient in depth on reasons to return to the office immediately should he/she discover anything concerning or new on the feet. All questions answered. Discussed proper shoes as well.  She would benefit from diabetic shoes due to her hyperkeratotic preulcerative lesions and foot deformities.  All symptomatic hyperkeratoses were safely debrided with a sterile #15 blade to patient's level of comfort without incident. We discussed preventative and palliative care of these lesions including supportive and accommodative shoegear, padding, prefabricated and custom molded accommodative orthoses, use of a pumice stone and lotions/creams daily.  Discussed the etiology and treatment options for the condition in detail with the patient. Educated patient on the topical and oral treatment options for mycotic nails. Recommended debridement of the nails today. Sharp and mechanical  debridement performed of all painful and mycotic nails today. Nails debrided in length and thickness using a nail nipper and a mechanical burr to level of comfort. Discussed treatment options including appropriate shoe gear. Follow up as needed for painful nails.       Return in about 3 months (around 05/14/2020).

## 2020-02-22 DIAGNOSIS — G4733 Obstructive sleep apnea (adult) (pediatric): Secondary | ICD-10-CM | POA: Diagnosis not present

## 2020-03-10 ENCOUNTER — Other Ambulatory Visit: Payer: Self-pay

## 2020-03-10 ENCOUNTER — Ambulatory Visit
Admission: RE | Admit: 2020-03-10 | Discharge: 2020-03-10 | Disposition: A | Discharge status: Home / Self Care | Payer: Medicare HMO | Source: Ambulatory Visit | Attending: Family Medicine | Admitting: Family Medicine

## 2020-03-10 DIAGNOSIS — Z1231 Encounter for screening mammogram for malignant neoplasm of breast: Secondary | ICD-10-CM | POA: Diagnosis not present

## 2020-03-24 DIAGNOSIS — G4733 Obstructive sleep apnea (adult) (pediatric): Secondary | ICD-10-CM | POA: Diagnosis not present

## 2020-03-27 DIAGNOSIS — G4733 Obstructive sleep apnea (adult) (pediatric): Secondary | ICD-10-CM | POA: Diagnosis not present

## 2020-04-01 DIAGNOSIS — H259 Unspecified age-related cataract: Secondary | ICD-10-CM | POA: Diagnosis not present

## 2020-04-01 DIAGNOSIS — Z0100 Encounter for examination of eyes and vision without abnormal findings: Secondary | ICD-10-CM | POA: Diagnosis not present

## 2020-04-01 DIAGNOSIS — Z135 Encounter for screening for eye and ear disorders: Secondary | ICD-10-CM | POA: Diagnosis not present

## 2020-04-01 DIAGNOSIS — H25099 Other age-related incipient cataract, unspecified eye: Secondary | ICD-10-CM | POA: Diagnosis not present

## 2020-04-24 DIAGNOSIS — G4733 Obstructive sleep apnea (adult) (pediatric): Secondary | ICD-10-CM | POA: Diagnosis not present

## 2020-05-13 DIAGNOSIS — N3281 Overactive bladder: Secondary | ICD-10-CM | POA: Diagnosis not present

## 2020-05-13 DIAGNOSIS — I10 Essential (primary) hypertension: Secondary | ICD-10-CM | POA: Diagnosis not present

## 2020-05-13 DIAGNOSIS — G4730 Sleep apnea, unspecified: Secondary | ICD-10-CM | POA: Diagnosis not present

## 2020-05-13 DIAGNOSIS — E1169 Type 2 diabetes mellitus with other specified complication: Secondary | ICD-10-CM | POA: Diagnosis not present

## 2020-05-13 DIAGNOSIS — E782 Mixed hyperlipidemia: Secondary | ICD-10-CM | POA: Diagnosis not present

## 2020-05-13 DIAGNOSIS — M130 Polyarthritis, unspecified: Secondary | ICD-10-CM | POA: Diagnosis not present

## 2020-05-13 DIAGNOSIS — G4700 Insomnia, unspecified: Secondary | ICD-10-CM | POA: Diagnosis not present

## 2020-05-13 DIAGNOSIS — N329 Bladder disorder, unspecified: Secondary | ICD-10-CM | POA: Diagnosis not present

## 2020-05-13 DIAGNOSIS — G4750 Parasomnia, unspecified: Secondary | ICD-10-CM | POA: Diagnosis not present

## 2020-05-13 DIAGNOSIS — F064 Anxiety disorder due to known physiological condition: Secondary | ICD-10-CM | POA: Diagnosis not present

## 2020-05-14 ENCOUNTER — Ambulatory Visit: Payer: Medicare HMO | Admitting: Podiatry

## 2020-05-14 ENCOUNTER — Other Ambulatory Visit: Payer: Self-pay

## 2020-05-14 DIAGNOSIS — E119 Type 2 diabetes mellitus without complications: Secondary | ICD-10-CM

## 2020-05-14 DIAGNOSIS — M79674 Pain in right toe(s): Secondary | ICD-10-CM

## 2020-05-14 DIAGNOSIS — B351 Tinea unguium: Secondary | ICD-10-CM

## 2020-05-14 DIAGNOSIS — M79675 Pain in left toe(s): Secondary | ICD-10-CM

## 2020-05-14 DIAGNOSIS — L84 Corns and callosities: Secondary | ICD-10-CM | POA: Diagnosis not present

## 2020-05-14 DIAGNOSIS — M79671 Pain in right foot: Secondary | ICD-10-CM

## 2020-05-15 ENCOUNTER — Encounter: Payer: Self-pay | Admitting: Podiatry

## 2020-05-15 NOTE — Progress Notes (Signed)
  Subjective:  Patient ID: Linda Arnold, female    DOB: Feb 16, 1951,  MRN: [id number]  Chief Complaint  Patient presents with  . Nail Problem    Nail and callus care     70 y.o. female presents with the above complaint. History confirmed with patient.  No new issues since last visit.  The calluses have returned.  She has been using urea cream which has helped quite a bit and softened the callus. The nails are painful.  Objective:  Physical Exam: warm, good capillary refill, no trophic changes or ulcerative lesions, normal DP and PT pulses and normal sensory exam.  Onychomycosis x10 Left Foot: Hallux valgus with bunion present, semireducible hammertoes present Right Foot: Mild hallux valgus with bunion present, semireducible hammertoes present, tailor's bunion present, hyperkeratosis submet 2-3, submet 5, submet 5 is very painful    Assessment:   1. Pain in right foot   2. Onychomycosis   3. Pain due to onychomycosis of toenails of both feet   4. Diabetes mellitus type 2, noninsulin dependent (HCC)   5. Callus of foot      Plan:  Patient was evaluated and treated and all questions answered.  Patient educated on diabetes. Discussed proper diabetic foot care and discussed risks and complications of disease. Educated patient in depth on reasons to return to the office immediately should he/she discover anything concerning or new on the feet. All questions answered. Discussed proper shoes as well.  She would benefit from diabetic shoes due to her hyperkeratotic preulcerative lesions and foot deformities.  All symptomatic hyperkeratoses were safely debrided with a sterile #15 blade to patient's level of comfort without incident. We discussed preventative and palliative care of these lesions including supportive and accommodative shoegear, padding, prefabricated and custom molded accommodative orthoses, use of a pumice stone and lotions/creams daily.  Discussed the etiology and  treatment options for the condition in detail with the patient. Educated patient on the topical and oral treatment options for mycotic nails. Recommended debridement of the nails today. Sharp and mechanical debridement performed of all painful and mycotic nails today. Nails debrided in length and thickness using a nail nipper and a mechanical burr to level of comfort. Discussed treatment options including appropriate shoe gear. Follow up as needed for painful nails.       Return in about 3 months (around 08/14/2020).

## 2020-05-19 NOTE — Progress Notes (Unsigned)
PATIENT: Linda Arnold DOB: 1950/12/19  REASON FOR VISIT: follow up HISTORY FROM: patient  HISTORY OF PRESENT ILLNESS: Today 05/20/20  HISTORY  Linda Arnold is a 70 year old female, seen in request by her primary care physician Dr. Parke Simmers, Adrian Saran for evaluation of memory loss, initial evaluation was on September 18, 2018.  I have reviewed and summarized the referring note from the referring physician.  She had a past medical history of diabetes, hyperlipidemia, she used to work at [employer] job, did daycare work for about 3 years, she lives alone, still drives, around 2008, she noticed gradual onset memory loss, initially forget people's name, telephone number, gradually getting worse, she forgot what she would have for breath first quickly  She was admitted to the hospital on August 14, 2018, while her sister talked with her, she was noted to repeating questions, confused, her sister came over to check on her, realize she was very confused, did not know what was going on, her sister called EMS, she was admitted to the hospital, she cannot remember the ride to hospital, woke up few hours later realized that she is in hospital.  I personally reviewed the brain on August 15, 2018, punctuated acute infarction at the right precentral gyri, otherwise there was generalized atrophy, especially at the left perisylvian fissure, CT angiogram of neck and brain showed no large vessel disease.  Laboratory evaluations showed normal lipid profile, A1c was 6.1, BMP showed calcium 8.4, normal CBC, Echocardiogram on August 15 2018, showed ejection fraction 60 to 65%,  UPDATE May 20 2019: She is overall doing very well, there is no longer confusional episode, she continue complains of intermittent mild short-term memory loss, she gave me the example, while talking with her friend, she could not think of what breakfast she had, but she can recall it few minutes later.  EEG was normal in July 2020,  Update  December 20, 2018 SS: She had a normal EEG in July 2020.  She has not had any further episodes of amnesia or prolonged memory loss.  She indicates she continues to report trouble with her short-term memory. Specifically, she records her daily food intake, often forgets what she ate for meals.  She lives alone, does her own ADLs, pays her bills, and drives a car without difficulty.  She walks 30 minutes daily.  Within the last month she was diagnosed with obstructive sleep apnea, has now started CPAP.  She presents today for follow-up unaccompanied.  She does report that she has noticed memory loss for the last 3 years. No family history of memory loss.   Update 05/20/2019 Dr. Terrace Arabia: TSH, RPR, Vitamin B12 were normal. I personally reviewed MRI of the brain in June 2020, mild supratentorium small vessel disease there was no acute abnormality.  She has obstructive sleep apnea, faithfully using her CPAP machine every night, exercise regularly, drink at least 6 cups of water daily.  She drives to clinic today by memory, is able to handle her household bills without any difficulty.  Update May 20, 2020 SS: Here today alone, feels short term memory is slipping. Lives alone, managing her household, finances, driving well. Ex: takes awhile to remember what she had for meals, comes to her later.. Hasn't given up anything because memory. No friends or family mention memory concerns for her. Using CPAP. Remains on aspirin 81 mg daily, exercise with walking. Sees PCP. MMSE 29/30 today.  REVIEW OF SYSTEMS: Out of a complete 14 system review of symptoms,  the patient complains only of the following symptoms, and all other reviewed systems are negative.  See HPI  ALLERGIES: Allergies  Allergen Reactions  . Betadine [Povidone Iodine] Rash  . Lisinopril Rash    HOME MEDICATIONS: Outpatient Medications Prior to Visit  Medication Sig Dispense Refill  . ASPIRIN 81 PO Take 81 mg by mouth daily.    . Calcium Citrate  (CITRACAL PO) Take 1 tablet by mouth daily.    Marland Kitchen losartan (COZAAR) 25 MG tablet Take 25 mg by mouth daily.    Marland Kitchen MEDROL 4 MG TBPK tablet SMARTSIG:Dose Pack By Mouth As Directed    . metFORMIN (GLUCOPHAGE) 500 MG tablet Take 500 mg by mouth 2 (two) times a day.     . mirabegron ER (MYRBETRIQ) 50 MG TB24 tablet Take 50 mg by mouth daily.    . pravastatin (PRAVACHOL) 40 MG tablet Take 40 mg by mouth daily.     No facility-administered medications prior to visit.    PAST MEDICAL HISTORY: Past Medical History:  Diagnosis Date  . Arthritis 1999   knees  . Diabetes mellitus without complication (HCC) 2005  . Dysphagia   . Hyperlipidemia   . Hypertension   . Memory loss     PAST SURGICAL HISTORY: Past Surgical History:  Procedure Laterality Date  . CESAREAN SECTION    . TUBAL LIGATION      FAMILY HISTORY: Family History  Problem Relation Age of Onset  . Diabetes Mother        died at age 57  . Congestive Heart Failure Mother   . Diabetes Sister   . Hyperlipidemia Sister   . Hypertension Sister   . Cancer Brother 31       Lung cancer x2 brothers  . Hypertension Brother   . Other Father        died in 47 from not being able to "pass his urine"    SOCIAL HISTORY: Social History   Socioeconomic History  . Marital status: Widowed    Spouse name: Not on file  . Number of children: 1  . Years of education: some college  . Highest education level: Not on file  Occupational History  . Occupation: retired  Tobacco Use  . Smoking status: Never Smoker  . Smokeless tobacco: Never Used  Substance and Sexual Activity  . Alcohol use: Never  . Drug use: No  . Sexual activity: Not Currently  Other Topics Concern  . Not on file  Social History Narrative   Widowed and unemployed. Attended some college. Walks infrequently for exercise. No tobacco or drug use. Previously went to [employer] until 2013.      Lives alone.   No caffeine use.   Right-handed.   Social  Determinants of Health   Financial Resource Strain: Not on file  Food Insecurity: Not on file  Transportation Needs: Not on file  Physical Activity: Not on file  Stress: Not on file  Social Connections: Not on file  Intimate Partner Violence: Not on file      PHYSICAL EXAM  Vitals:   05/20/20 1240  BP: 114/70  Pulse: 70  Weight: 188 lb (85.3 kg)  Height: 5\' 2"  (1.575 m)   Body mass index is 34.39 kg/m.  Generalized: Well developed, in no acute distress, well-appearing MMSE - Mini Mental State Exam 05/20/2020 05/20/2019 12/20/2018  Orientation to time 5 5 5   Orientation to Place 5 5 5   Registration 3 3 3   Attention/ Calculation 5 5 5  Recall 2 3 3   Language- name 2 objects 2 2 2   Language- repeat 1 1 1   Language- follow 3 step command 3 3 3   Language- read & follow direction 1 1 1   Write a sentence 1 1 1   Copy design 1 1 1   Total score 29 30 30   animal naming 9  Neurological examination  Mentation: Alert oriented to time, place, history taking. Follows all commands speech and language fluent, well dressed and groomed Cranial nerve II-XII: Pupils were equal round reactive to light. Extraocular movements were full, visual field were full on confrontational test. Facial sensation and strength were normal. Head turning and shoulder shrug  were normal and symmetric. Motor: The motor testing reveals 5 over 5 strength of all 4 extremities. Good symmetric motor tone is noted throughout.  Sensory: Sensory testing is intact to soft touch on all 4 extremities. No evidence of extinction is noted.  Coordination: Cerebellar testing reveals good finger-nose-finger and heel-to-shin bilaterally.  Gait and station: Gait is normal. Reflexes: Deep tendon reflexes are symmetric and normal bilaterally.   DIAGNOSTIC DATA (LABS, IMAGING, TESTING) - I reviewed patient records, labs, notes, testing and imaging myself where available.  Lab Results  Component Value Date   WBC 6.9 08/16/2018    HGB 12.1 08/16/2018   HCT 36.8 08/16/2018   MCV 92.0 08/16/2018   PLT 173 08/16/2018      Component Value Date/Time   NA 141 08/16/2018 0319   K 3.8 08/16/2018 0319   CL 109 08/16/2018 0319   CO2 24 08/16/2018 0319   GLUCOSE 92 08/16/2018 0319   BUN 15 08/16/2018 0319   CREATININE 0.62 08/16/2018 0319   CREATININE 0.76 02/18/2013 0830   CALCIUM 8.4 (L) 08/16/2018 0319   PROT 6.5 08/15/2018 0002   ALBUMIN 3.6 08/15/2018 0002   AST 22 08/15/2018 0002   ALT 19 08/15/2018 0002   ALKPHOS 59 08/15/2018 0002   BILITOT 0.9 08/15/2018 0002   GFRNONAA >60 08/16/2018 0319   GFRAA >60 08/16/2018 0319   Lab Results  Component Value Date   CHOL 160 08/16/2018   HDL 58 08/16/2018   LDLCALC 94 08/16/2018   LDLDIRECT 112 (H) 10/29/2012   TRIG 42 08/16/2018   CHOLHDL 2.8 08/16/2018   Lab Results  Component Value Date   HGBA1C 6.1 (H) 08/16/2018   Lab Results  Component Value Date   VITAMINB12 522 09/18/2018   Lab Results  Component Value Date   TSH 2.840 09/18/2018   ASSESSMENT AND PLAN 70 y.o. year old female   1.  Mild cognitive impairment 2.  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  This included previsit chart review, lab review, study review, order entry, electronic health record documentation, patient education.  10/31/2012, DNP  [facility] [address] [place], 09/20/2018 09/20/2018 [phone]

## 2020-05-20 ENCOUNTER — Encounter: Payer: Self-pay | Admitting: Neurology

## 2020-05-20 ENCOUNTER — Ambulatory Visit: Payer: Medicare HMO | Admitting: Neurology

## 2020-05-20 VITALS — BP 114/70 | HR 70 | Ht 62.0 in | Wt 188.0 lb

## 2020-05-20 DIAGNOSIS — G454 Transient global amnesia: Secondary | ICD-10-CM

## 2020-05-20 DIAGNOSIS — R413 Other amnesia: Secondary | ICD-10-CM | POA: Diagnosis not present

## 2020-05-20 NOTE — Patient Instructions (Signed)
Continue aspirin  Continue aspirin  Keep seeing your primary doctor or management of vascular risk factors See you back in 1 year

## 2020-05-21 DIAGNOSIS — E782 Mixed hyperlipidemia: Secondary | ICD-10-CM | POA: Diagnosis not present

## 2020-05-21 DIAGNOSIS — G4730 Sleep apnea, unspecified: Secondary | ICD-10-CM | POA: Diagnosis not present

## 2020-05-21 DIAGNOSIS — F064 Anxiety disorder due to known physiological condition: Secondary | ICD-10-CM | POA: Diagnosis not present

## 2020-05-21 DIAGNOSIS — N3281 Overactive bladder: Secondary | ICD-10-CM | POA: Diagnosis not present

## 2020-05-21 DIAGNOSIS — E1169 Type 2 diabetes mellitus with other specified complication: Secondary | ICD-10-CM | POA: Diagnosis not present

## 2020-05-21 DIAGNOSIS — M130 Polyarthritis, unspecified: Secondary | ICD-10-CM | POA: Diagnosis not present

## 2020-05-22 DIAGNOSIS — G4733 Obstructive sleep apnea (adult) (pediatric): Secondary | ICD-10-CM | POA: Diagnosis not present

## 2020-06-22 DIAGNOSIS — G4733 Obstructive sleep apnea (adult) (pediatric): Secondary | ICD-10-CM | POA: Diagnosis not present

## 2020-06-28 NOTE — Progress Notes (Signed)
   Subjective:    Patient ID: [name], female    DOB: January 08, 1951, 70 y.o.   MRN: [id number]   CC: Establish care  HPI:  Tahjanae Blankenburg is a very pleasant 70 y.o. female who presents today to establish care.  Initial concerns: Bubbles in urine for last few months, no blood or change in color, denies dysuria or urine frequency.   Past medical history: Type 2 diabetes, hypertension, hyperlipidemia, issues with memory.   Current medications: Metformin 1000 twice daily, pravastatin 40 mg daily, losartan 25 mg daily  Family history: Two brothers passed of lung cancer, they were smokers. Mother had CHF, mother had diabetes.   Social history: Denies alcohol, recreational drugs, never smoker.  ROS: pertinent noted in the HPI   Objective:  BP 134/84   Pulse 85   Ht 5' 2.5" (1.588 m)   Wt 185 lb 9.6 oz (84.2 kg)   SpO2 97%   BMI 33.41 kg/m   Vitals and nursing note reviewed  General: NAD, pleasant, able to participate in exam Cardiac: RRR, S1 S2 present. normal heart sounds, no murmurs. Respiratory: CTAB, normal effort, No wheezes, rales or rhonchi Abdomen: Bowel sounds present, non-tender, non-distended, no suprapubic discomfort to palpation Extremities: No pitting edema present Skin: warm and dry, no rashes noted Neuro: alert, no obvious focal deficits Psych: Normal affect and mood   Assessment & Plan:   Bubbles in urine: Patient states she has been having these for about 2 months now.  Denies any dysuria, urinary frequency, blood in urine.  Denies any change in urine smell or color.  No infectious symptoms.  Overall differential can include normal variant and urine, urinalysis performed today with no signs of infection, get of ketones, leukocytes, nitrite, glucose.  Was considering a BMP, however our lab unfortunately closed prior to the conclusion of this patient encounter.  We will await patient records from her previous PCP and consider a BMP at next appointment if she  is still having symptoms.  Patient is filling out paperwork for [place] to retrieve records from her previous physician as she states she recently had blood work about 3 months ago.  She has previously used Cologuard which she states she used to last about 1-2 years ago.  She states it was negative at that time.  We will see patient back in a couple weeks multiple rib receive her previous records to determine what other testing such as A1c/cholesterol/etc. need to be done.  Jackelyn Poling, DO [facility] PGY-2

## 2020-06-29 ENCOUNTER — Other Ambulatory Visit: Payer: Self-pay

## 2020-06-29 ENCOUNTER — Encounter: Payer: Self-pay | Admitting: Family Medicine

## 2020-06-29 ENCOUNTER — Ambulatory Visit (INDEPENDENT_AMBULATORY_CARE_PROVIDER_SITE_OTHER): Payer: Medicare HMO | Admitting: Family Medicine

## 2020-06-29 VITALS — BP 134/84 | HR 85 | Ht 62.5 in | Wt 185.6 lb

## 2020-06-29 DIAGNOSIS — R3915 Urgency of urination: Secondary | ICD-10-CM | POA: Diagnosis not present

## 2020-06-29 LAB — POCT URINALYSIS DIP (CLINITEK)
Bilirubin, UA: NEGATIVE
Glucose, UA: NEGATIVE mg/dL
Ketones, POC UA: NEGATIVE mg/dL
Leukocytes, UA: NEGATIVE
Nitrite, UA: NEGATIVE
POC PROTEIN,UA: NEGATIVE
Spec Grav, UA: 1.025 (ref 1.010–1.025)
Urobilinogen, UA: 0.2 E.U./dL
pH, UA: 7 (ref 5.0–8.0)

## 2020-06-29 NOTE — Patient Instructions (Signed)
It was great to meet you!  Our plans for today:  -I would like to see you back in 2 or 3 weeks once we get the records from your previous PCP so we know what labs to check in order. -Your urinalysis was negative for an obvious cause of your symptoms..  Take care and seek immediate care sooner if you develop any concerns.   Dr. [facility]

## 2020-07-01 DIAGNOSIS — G4733 Obstructive sleep apnea (adult) (pediatric): Secondary | ICD-10-CM | POA: Diagnosis not present

## 2020-07-04 DIAGNOSIS — Z7984 Long term (current) use of oral hypoglycemic drugs: Secondary | ICD-10-CM | POA: Diagnosis not present

## 2020-07-04 DIAGNOSIS — E785 Hyperlipidemia, unspecified: Secondary | ICD-10-CM | POA: Diagnosis not present

## 2020-07-04 DIAGNOSIS — E1100 Type 2 diabetes mellitus with hyperosmolarity without nonketotic hyperglycemic-hyperosmolar coma (NKHHC): Secondary | ICD-10-CM | POA: Diagnosis not present

## 2020-07-04 DIAGNOSIS — I10 Essential (primary) hypertension: Secondary | ICD-10-CM | POA: Diagnosis not present

## 2020-07-22 DIAGNOSIS — G4733 Obstructive sleep apnea (adult) (pediatric): Secondary | ICD-10-CM | POA: Diagnosis not present

## 2020-07-22 NOTE — Patient Instructions (Signed)
It was great to see you! Thank you for allowing me to participate in your care!  I recommend that you always bring your medications to each appointment as this makes it easy to ensure we are on the correct medications and helps [place] not miss when refills are needed.  Our plans for today:  -We are checking some lab work and urine studies today.  I will call you with these results.  We are also going to check your A1c today. -I recommend you follow-up in about 2 to 3 weeks when we should have your medical records.  Once we have those we will determine if we need to screen for hepatitis C, cholesterol panel.   We are checking some labs today, I will call you if they are abnormal will send you a MyChart message or a letter if they are normal.  If you do not hear about your labs in the next 2 weeks please let us know.  Take care and seek immediate care sooner if you develop any concerns.   Dr. Jackelyn Poling, DO [facility]

## 2020-07-22 NOTE — Progress Notes (Signed)
    SUBJECTIVE:   CHIEF COMPLAINT / HPI:   Follow-up-bubbles in urine: Patient is a 70 year old female with presents for follow-up after being seen to establish care and having complaints of some bubbles in her urine at that time.  At that time she did have a urinalysis which was negative, however due to scheduling the lab was closed by the time our appointment had finished and so we were unable to get additional blood work.  Today she states the urine bubbles continue.  She denies any dysuria, suprapubic discomfort, or air that seems to come from the bladder.  She states her previous doctor did work this up and told her that it was nothing to worry about.  She does have a history of urinary frequency.   PERTINENT  PMH / PSH: None relevant  OBJECTIVE:   Ht 5' 2.4" (1.585 m)   Wt 186 lb (84.4 kg)   BMI 33.59 kg/m    General: NAD, pleasant, able to participate in exam Cardiac: RRR, no murmurs. Respiratory: CTAB, normal effort, No wheezes, rales or rhonchi Abdomen: Bowel sounds present, nontender, nondistended, no suprapubic discomfort Extremities: no edema or cyanosis. Skin: warm and dry, no rashes noted Neuro: alert, no obvious focal deficits Psych: Normal affect and mood  ASSESSMENT/PLAN:   "Bubbles in urine" Assessment: 70 year old female following for "bubbles in urine".  She states she has no dysuria, no erythema to come from her bladder.  She does have some history of urinary frequency.  Overall differential can include viscosity of the urine which should be seen on urine specific gravity versus abnormal amounts of protein in the urine versus infectious etiology.  We will get a CMP to evaluate albumin, urine protein, urinalysis.  Will check A1c.  Jackelyn Poling, DO [facility]    This note was prepared using Dragon voice recognition software and may include unintentional dictation errors due to the inherent limitations of voice recognition  software.

## 2020-07-23 ENCOUNTER — Encounter: Payer: Self-pay | Admitting: Family Medicine

## 2020-07-23 ENCOUNTER — Other Ambulatory Visit: Payer: Self-pay

## 2020-07-23 ENCOUNTER — Ambulatory Visit (INDEPENDENT_AMBULATORY_CARE_PROVIDER_SITE_OTHER): Payer: Medicare HMO | Admitting: Family Medicine

## 2020-07-23 VITALS — BP 130/82 | HR 74 | Ht 62.4 in | Wt 186.0 lb

## 2020-07-23 DIAGNOSIS — R3915 Urgency of urination: Secondary | ICD-10-CM | POA: Diagnosis not present

## 2020-07-23 DIAGNOSIS — Z131 Encounter for screening for diabetes mellitus: Secondary | ICD-10-CM | POA: Diagnosis not present

## 2020-07-23 LAB — POCT URINALYSIS DIP (MANUAL ENTRY)
Bilirubin, UA: NEGATIVE
Blood, UA: NEGATIVE
Glucose, UA: NEGATIVE mg/dL
Ketones, POC UA: NEGATIVE mg/dL
Leukocytes, UA: NEGATIVE
Nitrite, UA: NEGATIVE
Protein Ur, POC: NEGATIVE mg/dL
Spec Grav, UA: 1.01 (ref 1.010–1.025)
Urobilinogen, UA: 0.2 E.U./dL
pH, UA: 6 (ref 5.0–8.0)

## 2020-07-24 LAB — COMPREHENSIVE METABOLIC PANEL
ALT: 25 IU/L (ref 0–32)
AST: 23 IU/L (ref 0–40)
Albumin/Globulin Ratio: 2 (ref 1.2–2.2)
Albumin: 4.5 g/dL (ref 3.7–4.7)
Alkaline Phosphatase: 87 IU/L (ref 44–121)
BUN/Creatinine Ratio: 17 (ref 12–28)
BUN: 14 mg/dL (ref 8–27)
Bilirubin Total: 0.3 mg/dL (ref 0.0–1.2)
CO2: 24 mmol/L (ref 20–29)
Calcium: 9.3 mg/dL (ref 8.7–10.3)
Chloride: 102 mmol/L (ref 96–106)
Creatinine, Ser: 0.84 mg/dL (ref 0.57–1.00)
Globulin, Total: 2.3 g/dL (ref 1.5–4.5)
Glucose: 106 mg/dL — ABNORMAL HIGH (ref 65–99)
Potassium: 4.1 mmol/L (ref 3.5–5.2)
Sodium: 141 mmol/L (ref 134–144)
Total Protein: 6.8 g/dL (ref 6.0–8.5)
eGFR: 74 mL/min/{1.73_m2} (ref >59)

## 2020-07-24 LAB — HEMOGLOBIN A1C
Est. average glucose Bld gHb Est-mCnc: 134 mg/dL
Hgb A1c MFr Bld: 6.3 % — ABNORMAL HIGH (ref 4.8–5.6)

## 2020-07-24 LAB — PROTEIN / CREATININE RATIO, URINE
Creatinine, Urine: 40.8 mg/dL
Protein, Ur: <4 mg/dL

## 2020-08-09 NOTE — Progress Notes (Signed)
    SUBJECTIVE:   CHIEF COMPLAINT / HPI:    Follow-up-bubbles in urine: Patient is a 70 year old female that presents for follow-up on bubbles in her urine.  At our previous appointment we checked numerous lab work including a urinalysis.  None of this explains her symptoms.  Today she states she continues to have bubbles in her urine and wanted to follow-up to make sure there was nothing to be concerned about.  She denies any pain, dysuria, change in urine color, change in urine smell.  She states she is mostly concerned because she spoke to a couple of her female friends who state they never have significant number of bubbles in their urine.  She denies feeling any air or gas leave her body when urinating but states that in the toilet bowl she sees lots of bubbles in the urine.Marland Kitchen  PERTINENT  PMH / PSH: None relevant  OBJECTIVE:   BP 102/70   Pulse 83   Ht 5\' 2"  (1.575 m)   Wt 187 lb 6 oz (85 kg)   SpO2 97%   BMI 34.27 kg/m    General: NAD, pleasant, able to participate in exam Cardiac: RRR, no murmurs. Respiratory: CTAB, normal effort Abdomen: Bowel sounds present, nontender, nondistended, no hepatosplenomegaly. Psych: Normal affect and mood  ASSESSMENT/PLAN:   Bubbles in urine: Assessment: 70 year old female with complaint of bubbles in her urine.  We previously checked a urinalysis which did not show any obvious causes for this as well as blood work to evaluate kidney function and electrolytes and this also was unrevealing.  We checked a urine protein creatinine ratio which was low, not high.  Patient denies any pain.  She denies any symptoms of any gas or bubbles leaving her urethra when urinating.  She denies any change in color or smell of her urine.  I have low concern for malignancy, electrolyte derangement, fistula, nephrotic syndrome with her previous normal lab work and no concern of pain.  Discussed possibilities to set her mind at ease include an abdominal CT which I  personally recommended was not necessary as well as a referral to urology.  The patient stated that at this time she will continue to monitor her symptoms and at her follow-up appointment in about 6 months if she is still having these symptoms she may opt for the urology referral.  I informed her that if she ever developed concerned and wanted this referral I would be happy to place it after she called and let me know but that I do not necessarily believe this is needed with her previous lab work.   60, DO  [facility]    This note was prepared using Dragon voice recognition software and may include unintentional dictation errors due to the inherent limitations of voice recognition software.

## 2020-08-10 ENCOUNTER — Ambulatory Visit (INDEPENDENT_AMBULATORY_CARE_PROVIDER_SITE_OTHER): Payer: Medicare HMO | Admitting: Family Medicine

## 2020-08-10 ENCOUNTER — Other Ambulatory Visit: Payer: Self-pay

## 2020-08-10 ENCOUNTER — Encounter: Payer: Self-pay | Admitting: Family Medicine

## 2020-08-10 VITALS — BP 102/70 | HR 83 | Ht 62.0 in | Wt 187.4 lb

## 2020-08-10 DIAGNOSIS — R3915 Urgency of urination: Secondary | ICD-10-CM

## 2020-08-18 ENCOUNTER — Other Ambulatory Visit: Payer: Self-pay

## 2020-08-18 ENCOUNTER — Encounter: Payer: Self-pay | Admitting: Podiatry

## 2020-08-18 ENCOUNTER — Ambulatory Visit: Payer: Medicare HMO | Admitting: Podiatry

## 2020-08-18 DIAGNOSIS — E119 Type 2 diabetes mellitus without complications: Secondary | ICD-10-CM | POA: Diagnosis not present

## 2020-08-18 DIAGNOSIS — M2042 Other hammer toe(s) (acquired), left foot: Secondary | ICD-10-CM | POA: Diagnosis not present

## 2020-08-18 DIAGNOSIS — M21612 Bunion of left foot: Secondary | ICD-10-CM

## 2020-08-18 DIAGNOSIS — B351 Tinea unguium: Secondary | ICD-10-CM

## 2020-08-18 DIAGNOSIS — M2012 Hallux valgus (acquired), left foot: Secondary | ICD-10-CM | POA: Diagnosis not present

## 2020-08-18 DIAGNOSIS — M79674 Pain in right toe(s): Secondary | ICD-10-CM

## 2020-08-18 DIAGNOSIS — L84 Corns and callosities: Secondary | ICD-10-CM | POA: Diagnosis not present

## 2020-08-18 NOTE — Progress Notes (Signed)
  Subjective:  Patient ID: Linda Arnold, female    DOB: August 30, 1950,  MRN: [id number]  Chief Complaint  Patient presents with   Callouses    Painful callus lesion, 3 month follow up    70 y.o. female in for follow-up with the above complaint. History confirmed with patient.  No new issues since last visit.  The calluses have returned.  She has been using urea cream which has helped quite a bit and softened the callus. The nails are painful.  The bunion is starting to hurt little more on the left side but is overall intermittent and not every day.  Objective:  Physical Exam: warm, good capillary refill, no trophic changes or ulcerative lesions, normal DP and PT pulses and normal sensory exam.  Onychomycosis x10 Left Foot: Hallux valgus with bunion present, semireducible hammertoes present Right Foot: Mild hallux valgus with bunion present, semireducible hammertoes present, tailor's bunion present, hyperkeratosis submet 2-3, submet 5, submet 5 is very painful    Assessment:   1. Hallux valgus with bunions, left   2. Hammertoe of left foot   3. Callus of foot   4. Diabetes mellitus type 2, noninsulin dependent (HCC)   5. Pain due to onychomycosis of toenails of both feet      Plan:  Patient was evaluated and treated and all questions answered.  Patient educated on diabetes. Discussed proper diabetic foot care and discussed risks and complications of disease. Educated patient in depth on reasons to return to the office immediately should he/she discover anything concerning or new on the feet. All questions answered. Discussed proper shoes as well.  She would benefit from diabetic shoes due to her hyperkeratotic preulcerative lesions and foot deformities.  All symptomatic hyperkeratoses were safely debrided with a sterile #15 blade to patient's level of comfort without incident. We discussed preventative and palliative care of these lesions including supportive and accommodative  shoegear, padding, prefabricated and custom molded accommodative orthoses, use of a pumice stone and lotions/creams daily.  Discussed the etiology and treatment options for the condition in detail with the patient. Educated patient on the topical and oral treatment options for mycotic nails. Recommended debridement of the nails today. Sharp and mechanical debridement performed of all painful and mycotic nails today. Nails debrided in length and thickness using a nail nipper and a mechanical burr to level of comfort. Discussed treatment options including appropriate shoe gear. Follow up as needed for painful nails.    Discussed treatment options for bunions with her including nonoperative and operative treatment.  Recommend offloading with wider shoes and keeping pressure off of them.  They are only intermittently bothersome right now.  If they become worse we could consider x-rays and possible discussion of surgical intervention.  We discussed this briefly today and what it might entail.  She will let me know if they begin to bother her more.     Return if symptoms worsen or fail to improve.

## 2020-08-22 DIAGNOSIS — G4733 Obstructive sleep apnea (adult) (pediatric): Secondary | ICD-10-CM | POA: Diagnosis not present

## 2020-09-09 ENCOUNTER — Other Ambulatory Visit: Payer: Self-pay

## 2020-09-09 MED ORDER — LOSARTAN POTASSIUM 25 MG PO TABS: 25.0000 mg | ORAL_TABLET | Freq: Every day | ORAL | 2 refills | Status: DC

## 2020-09-09 MED ORDER — METFORMIN HCL 500 MG PO TABS: 500.0000 mg | ORAL_TABLET | Freq: Two times a day (BID) | ORAL | 3 refills | Status: DC

## 2020-09-21 DIAGNOSIS — G4733 Obstructive sleep apnea (adult) (pediatric): Secondary | ICD-10-CM | POA: Diagnosis not present

## 2020-10-23 DIAGNOSIS — G4733 Obstructive sleep apnea (adult) (pediatric): Secondary | ICD-10-CM | POA: Diagnosis not present

## 2020-11-04 ENCOUNTER — Ambulatory Visit (INDEPENDENT_AMBULATORY_CARE_PROVIDER_SITE_OTHER): Payer: Medicare HMO

## 2020-11-04 ENCOUNTER — Other Ambulatory Visit: Payer: Self-pay

## 2020-11-04 VITALS — BP 126/62 | HR 81 | Ht 63.0 in | Wt 186.0 lb

## 2020-11-04 DIAGNOSIS — Z0000 Encounter for general adult medical examination without abnormal findings: Secondary | ICD-10-CM | POA: Diagnosis not present

## 2020-11-04 NOTE — Progress Notes (Signed)
Subjective:   Linda Arnold is a 70 y.o. female who presents for Medicare Annual (Subsequent) preventive examination.  Review of Systems: Defer to PCP.  Cardiac Risk Factors include: advanced age (>38men, >1 women);hypertension;diabetes mellitus  Objective:   Vitals: BP 126/62   Pulse 81   Ht 5\' 3"  (1.6 m)   Wt 186 lb (84.4 kg)   SpO2 95%   BMI 32.95 kg/m   Body mass index is 32.95 kg/m.  Advanced Directives 11/04/2020 08/10/2020 07/23/2020 01/02/2019 08/15/2018 12/09/2013  Does Patient Have a Medical Advance Directive? Yes No No No No No  Type of Advance Directive Healthcare Power of Attorney - - - - -  Does patient want to make changes to medical advance directive? No - Patient declined - - - - -  Copy of Healthcare Power of Attorney in Chart? No - copy requested - - - - -  Would patient like information on creating a medical advance directive? - No - Patient declined No - Patient declined No - Patient declined Yes (Inpatient - patient requests chaplain consult to create a medical advance directive) -   Patient reports she does have a healthcare power of attorney, however we do not have one on file. Packet given if she can not locate her original.   Tobacco Social History   Tobacco Use  Smoking Status Never  Smokeless Tobacco Never     Clinical Intake:  Pre-visit preparation completed: Yes  Pain Score: 0-No pain  How often do you need to have someone help you when you read instructions, pamphlets, or other written materials from your doctor or pharmacy?: 1 - Never What is the last grade level you completed in school?: College  Past Medical History:  Diagnosis Date   Arthritis 1999   knees   Diabetes mellitus without complication (HCC) 2005   Dysphagia    Hyperlipidemia    Hypertension    Memory loss    Past Surgical History:  Procedure Laterality Date   CESAREAN SECTION     TUBAL LIGATION     Family History  Problem Relation Age of Onset   Diabetes Mother         died at age 63   Congestive Heart Failure Mother    Diabetes Sister    Hyperlipidemia Sister    Hypertension Sister    Cancer Brother 42       Lung cancer x2 brothers   Hypertension Brother    Other Father        died in 68 from not being able to "pass his urine"   Social History   Socioeconomic History   Marital status: Widowed    Spouse name: Not on file   Number of children: 1   Years of education: 14   Highest education level: Associate degree: academic program  Occupational History   Occupation: retired  Tobacco Use   Smoking status: Never   Smokeless tobacco: Never  Vaping Use   Vaping Use: Never used  Substance and Sexual Activity   Alcohol use: Never   Drug use: No   Sexual activity: Not Currently  Other Topics Concern   Not on file  Social History Narrative   Patient lives alone in [place].    Patient is a widower and has one son located in [place].    Patient walks daily for exercise.    Patient enjoys reading, shopping, friends, and walking.    Social Determinants of Health   Financial Resource Strain: Low  Risk    Difficulty of Paying Living Expenses: Not hard at all  Food Insecurity: No Food Insecurity   Worried About Running Out of Food in the Last Year: Never true   Ran Out of Food in the Last Year: Never true  Transportation Needs: No Transportation Needs   Lack of Transportation (Medical): No   Lack of Transportation (Non-Medical): No  Physical Activity: Sufficiently Active   Days of Exercise per Week: 7 days   Minutes of Exercise per Session: 30 min  Stress: No Stress Concern Present   Feeling of Stress : Only a little  Social Connections: Moderately Isolated   Frequency of Communication with Friends and Family: More than three times a week   Frequency of Social Gatherings with Friends and Family: More than three times a week   Attends Religious Services: More than 4 times per year   Active Member of [employer] or Organizations: No    Attends Banker Meetings: Never   Marital Status: Widowed   Outpatient Encounter Medications as of 11/04/2020  Medication Sig   ASPIRIN 81 PO Take 81 mg by mouth daily.   Calcium Citrate (CITRACAL PO) Take 1 tablet by mouth daily.   losartan (COZAAR) 25 MG tablet Take 1 tablet (25 mg total) by mouth daily.   metFORMIN (GLUCOPHAGE) 500 MG tablet Take 1 tablet (500 mg total) by mouth 2 (two) times daily with a meal.   pravastatin (PRAVACHOL) 40 MG tablet Take 40 mg by mouth daily.   No facility-administered encounter medications on file as of 11/04/2020.   Activities of Daily Living In your present state of health, do you have any difficulty performing the following activities: 11/04/2020  Hearing? N  Vision? Y  Difficulty concentrating or making decisions? Y  Walking or climbing stairs? N  Dressing or bathing? N  Doing errands, shopping? N  Preparing Food and eating ? N  Using the Toilet? N  In the past six months, have you accidently leaked urine? Y  Do you have problems with loss of bowel control? N  Managing your Medications? N  Managing your Finances? N  Housekeeping or managing your Housekeeping? N  Some recent data might be hidden   Patient Care Team: Jackelyn Poling, DO as PCP - General (Family Medicine) Lilian Kapur, Rachelle Hora, DPM as Consulting Physician (Podiatry) Glean Salvo, NP as Nurse Practitioner (Neurology)    Assessment:   This is a routine wellness examination for [place].  Exercise Activities and Dietary recommendations Current Exercise Habits: Home exercise routine, Type of exercise: walking, Time (Minutes): 30, Frequency (Times/Week): 7, Weekly Exercise (Minutes/Week): 210, Intensity: Mild, Exercise limited by: None identified   Goals      DIET - REDUCE PORTION SIZE       Fall Risk Fall Risk  11/04/2020 08/10/2020 07/23/2020 06/29/2020 10/29/2012  Falls in the past year? 1 1 1 1  No  Number falls in past yr: 1 0 0 0 -  Injury with Fall? 0 1 1 0 -   Risk for fall due to : History of fall(s) - - - -  Follow up Falls prevention discussed - - Falls evaluation completed;Education provided -   Gait appropriate. Patient does not need assistive devices to ambulate.   Is the patient's home free of loose throw rugs in walkways, pet beds, electrical cords, etc?   yes      Grab bars in the bathroom? yes      Handrails on the stairs?   yes  Adequate lighting?   yes  Patient rating of health (0-10) scale: 7   Depression Screen PHQ 2/9 Scores 11/04/2020 08/10/2020 07/23/2020 06/29/2020  PHQ - 2 Score 0 1 0 0  PHQ- 9 Score - 8 7 6     Cognitive Function MMSE - Mini Mental State Exam 05/20/2020 05/20/2019 12/20/2018 09/18/2018  Orientation to time 5 5 5 5   Orientation to Place 5 5 5 5   Registration 3 3 3 3   Attention/ Calculation 5 5 5 5   Recall 2 3 3 2   Language- name 2 objects 2 2 2 2   Language- repeat 1 1 1 1   Language- follow 3 step command 3 3 3 3   Language- read & follow direction 1 1 1 1   Write a sentence 1 1 1 1   Copy design 1 1 1 1   Total score 29 30 30 29    6CIT Screen 11/04/2020  What Year? 0 points  What month? 0 points  What time? 0 points  Count back from 20 0 points  Months in reverse 0 points  Repeat phrase 0 points  Total Score 0   Immunization History  Administered Date(s) Administered   Influenza Split 03/15/2012   Influenza,inj,Quad PF,6+ Mos 11/22/2012   Moderna Sars-Covid-2 Vaccination 06/19/2019, 07/17/2019   PFIZER Comirnaty(Gray Top)Covid-19 Tri-Sucrose Vaccine 04/07/2020   Pneumococcal Polysaccharide-23 10/01/2004, 11/22/2012   Td 10/01/2004   Screening Tests Health Maintenance  Topic Date Due   Hepatitis C Screening  Never done   COLONOSCOPY (Pts 45-238yrs Insurance coverage will need to be confirmed)  Never done   Zoster Vaccines- Shingrix (1 of 2) Never done   OPHTHALMOLOGY EXAM  01/13/2013   FOOT EXAM  10/29/2013   COLON CANCER SCREENING ANNUAL FOBT  02/18/2014   DEXA SCAN  Never done   PNA  vac Low Risk Adult (1 of 2 - PCV13) 03/05/2014   TETANUS/TDAP  10/02/2014   COVID-19 Vaccine (4 - Booster for Moderna series) 08/05/2020   INFLUENZA VACCINE  10/05/2020   HEMOGLOBIN A1C  01/23/2021   MAMMOGRAM  03/10/2022   HPV VACCINES  Aged Out   Cancer Screenings: Lung: Low Dose CT Chest recommended if Age 27-80 years, 30 pack-year currently smoking OR have quit w/in 15years. Patient does qualify. Breast:  Up to date on Mammogram? UTD- due January 2023. Up to date of Bone Density/Dexa? Never- will refer  Colorectal: 8/1 cologuard- results pending   Vision Screening: Recommended annual ophthalmology exams for early detection of glaucoma and other disorders of the eye.  TDAP status: Due, Education has been provided regarding the importance of this vaccine. Advised may receive this vaccine at local pharmacy or Health Dept. Aware to provide a copy of the vaccination record if obtained from local pharmacy or Health Dept. Verbalized acceptance and understanding.  Plan:  PCP apt scheduled for 9/27 @130pm . Plan to get your flu and #2 covid vaccines!  Keep up the great work walking daily.  See AVS for portion control tips.  Will refer for bone density scan.  I have personally reviewed and noted the following in the patient's chart:   Medical and social history Use of alcohol, tobacco or illicit drugs  Current medications and supplements Functional ability and status Nutritional status Physical activity Advanced directives List of other physicians Hospitalizations, surgeries, and ER visits in previous 12 months Vitals Screenings to include cognitive, depression, and falls Referrals and appointments  In addition, I have reviewed and discussed with patient certain preventive protocols, quality metrics, and best practice  recommendations. A written personalized care plan for preventive services as well as general preventive health recommendations were provided to patient.  Steva Colder, CMA  11/04/2020

## 2020-11-04 NOTE — Patient Instructions (Signed)
You spoke to Linda Arnold, [place] for your annual wellness visit.  We discussed goals:   Goals      DIET - REDUCE PORTION SIZE        We also discussed recommended health maintenance. Please call our office and schedule a visit. As discussed, you are due for:  Health Maintenance  Topic Date Due   Hepatitis C Screening  Never done   COLONOSCOPY (Pts 45-30yr Insurance coverage will need to be confirmed)  Never done   Zoster Vaccines- Shingrix (1 of 2) Never done   OPHTHALMOLOGY EXAM  01/13/2013   FOOT EXAM  10/29/2013   COLON CANCER SCREENING ANNUAL FOBT  02/18/2014   DEXA SCAN  Never done   PNA vac Low Risk Adult (1 of 2 - PCV13) 03/05/2014   TETANUS/TDAP  10/02/2014   COVID-19 Vaccine (4 - Booster for Moderna series) 08/05/2020   INFLUENZA VACCINE  10/05/2020   HEMOGLOBIN A1C  01/23/2021   MAMMOGRAM  03/10/2022   HPV VACCINES  Aged Out   PCP apt scheduled for 9/27 @130pm . Plan to get your flu and #2 covid vaccines!  Keep up the great work walking daily.  See AVS for portion control tips.  Will refer for bone density scan.  Preventive Care 686Years and Older, Female Preventive care refers to lifestyle choices and visits with your health care provider that can promote health and wellness. This includes: A yearly physical exam. This is also called an annual wellness visit. Regular dental and eye exams. Immunizations. Screening for certain conditions. Healthy lifestyle choices, such as: Eating a healthy diet. Getting regular exercise. Not using drugs or products that contain nicotine and tobacco. Limiting alcohol use. What can I expect for my preventive care visit? Physical exam Your health care provider will check your: Height and weight. These may be used to calculate your BMI (body mass index). BMI is a measurement that tells if you are at a healthy weight. Heart rate and blood pressure. Body temperature. Skin for abnormal spots. Counseling Your health care  provider may ask you questions about your: Past medical problems. Family's medical history. Alcohol, tobacco, and drug use. Emotional well-being. Home life and relationship well-being. Sexual activity. Diet, exercise, and sleep habits. History of falls. Memory and ability to understand (cognition). Work and work eStatistician Pregnancy and menstrual history. Access to firearms. What immunizations do I need? Vaccines are usually given at various ages, according to a schedule. Your health care provider will recommend vaccines for you based on your age, medical history, and lifestyle or other factors, such as travel or where you work. What tests do I need? Blood tests Lipid and cholesterol levels. These may be checked every 5 years, or more often depending on your overall health. Hepatitis C test. Hepatitis B test. Screening Lung cancer screening. You may have this screening every year starting at age 3635if you have a 30-pack-year history of smoking and currently smoke or have quit within the past 15 years. Colorectal cancer screening. All adults should have this screening starting at age 3674and continuing until age 72 Your health care provider may recommend screening at age 3339if you are at increased risk. You will have tests every 1-10 years, depending on your results and the type of screening test. Diabetes screening. This is done by checking your blood sugar (glucose) after you have not eaten for a while (fasting). You may have this done every 1-3 years. Mammogram. This may be done every 1-2  years. Talk with your health care provider about how often you should have regular mammograms. Abdominal aortic aneurysm (AAA) screening. You may need this if you are a current or former smoker. BRCA-related cancer screening. This may be done if you have a family history of breast, ovarian, tubal, or peritoneal cancers. Other tests STD (sexually transmitted disease) testing, if you are at  risk. Bone density scan. This is done to screen for osteoporosis. You may have this done starting at age 30. Talk with your health care provider about your test results, treatment options, and if necessary, the need for more tests. Follow these instructions at home: Eating and drinking  Eat a diet that includes fresh fruits and vegetables, whole grains, lean protein, and low-fat dairy products. Limit your intake of foods with high amounts of sugar, saturated fats, and salt. Take vitamin and mineral supplements as recommended by your health care provider. Do not drink alcohol if your health care provider tells you not to drink. If you drink alcohol: Limit how much you have to 0-1 drink a day. Be aware of how much alcohol is in your drink. In the [place] one drink equals one 12 oz bottle of beer (355 mL), one 5 oz glass of wine (148 mL), or one 1 oz glass of hard liquor (44 mL). Lifestyle Take daily care of your teeth and gums. Brush your teeth every morning and night with fluoride toothpaste. Floss one time each day. Stay active. Exercise for at least 30 minutes 5 or more days each week. Do not use any products that contain nicotine or tobacco, such as cigarettes, e-cigarettes, and chewing tobacco. If you need help quitting, ask your health care provider. Do not use drugs. If you are sexually active, practice safe sex. Use a condom or other form of protection in order to prevent STIs (sexually transmitted infections). Talk with your health care provider about taking a low-dose aspirin or statin. Find healthy ways to cope with stress, such as: Meditation, yoga, or listening to music. Journaling. Talking to a trusted person. Spending time with friends and family. Safety Always wear your seat belt while driving or riding in a vehicle. Do not drive: If you have been drinking alcohol. Do not ride with someone who has been drinking. When you are tired or distracted. While texting. Wear a helmet  and other protective equipment during sports activities. If you have firearms in your house, make sure you follow all gun safety procedures. What's next? Visit your health care provider once a year for an annual wellness visit. Ask your health care provider how often you should have your eyes and teeth checked. Stay up to date on all vaccines. This information is not intended to replace advice given to you by your health care provider. Make sure you discuss any questions you have with your health care provider. Document Revised: 05/01/2020 Document Reviewed: 02/15/2018 Elsevier Patient Education  2022 [employer].  Here is an example of what a healthy plate looks like:    ? Make half your plate fruits and vegetables.     ? Focus on whole fruits.     ? Vary your veggies.  ? Make half your grains whole grains. -     ? Look for the word "whole" at the beginning of the ingredients list    ? Some whole-grain ingredients include whole oats, whole-wheat flour,        whole-grain corn, whole-grain brown rice, and whole rye.  ? Move to  low-fat and fat-free milk or yogurt.  ? Vary your protein routine. - Meat, fish, poultry (chicken, [place]), eggs, beans (kidney, pinto), dairy.  ? Drink and eat less sodium, saturated fat, and added sugars.  Our clinic's number is [phone]. Please call with questions or concerns about what we discussed today.

## 2020-11-04 NOTE — Progress Notes (Addendum)
I have reviewed this visit and agree with the documentation.   

## 2020-11-11 ENCOUNTER — Other Ambulatory Visit: Payer: Self-pay | Admitting: Family Medicine

## 2020-11-11 ENCOUNTER — Telehealth: Payer: Self-pay | Admitting: *Deleted

## 2020-11-11 NOTE — Telephone Encounter (Signed)
Rx request for accu-chek softclix lancets. Please advise. Ilario Dhaliwal Bruna Potter, CMA

## 2020-11-19 ENCOUNTER — Ambulatory Visit: Payer: Medicare HMO | Admitting: Podiatry

## 2020-11-23 ENCOUNTER — Telehealth: Payer: Self-pay

## 2020-11-23 NOTE — Telephone Encounter (Signed)
Attempted to reach patient to make appt for follow up and med refills. No answer. LVM for patient to call the office to make this appt before any medication can be refilled.Aquilla Solian, CMA

## 2020-11-30 ENCOUNTER — Ambulatory Visit: Payer: Medicare HMO | Admitting: Podiatry

## 2020-11-30 ENCOUNTER — Other Ambulatory Visit: Payer: Self-pay

## 2020-11-30 DIAGNOSIS — L84 Corns and callosities: Secondary | ICD-10-CM

## 2020-11-30 DIAGNOSIS — M79675 Pain in left toe(s): Secondary | ICD-10-CM | POA: Diagnosis not present

## 2020-11-30 DIAGNOSIS — B351 Tinea unguium: Secondary | ICD-10-CM

## 2020-11-30 DIAGNOSIS — M79674 Pain in right toe(s): Secondary | ICD-10-CM

## 2020-11-30 DIAGNOSIS — E119 Type 2 diabetes mellitus without complications: Secondary | ICD-10-CM

## 2020-11-30 NOTE — Progress Notes (Signed)
    SUBJECTIVE:   CHIEF COMPLAINT / HPI:   Colon cancer screening: 70 year old female presents today to discuss colon cancer screening.  She states that she is not interested in colonoscopy at this time.  She states that she recently submitted a Cologuard about a month and a half ago but when calling to check on it states that the sample was not usable.  She states that she would like to do Cologuard again because she is not interested in doing a colonoscopy.  She denies any melena or blood in stools.  Bubbles in urine: Continues to have these. She has no pain but has concerns because "no one else seems to have this problem". She says her urine is "foamy" and light yellow in color. We had previously had a few appointments discussing this and labwork had been unrevealing, and our plan was if they continued to bother her we would refer to urology at her request.   PERTINENT  PMH / PSH: None relevant  OBJECTIVE:   BP 110/60   Pulse 88   Wt 186 lb 8 oz (84.6 kg)   SpO2 97%   BMI 33.04 kg/m    General: NAD, pleasant, able to participate in exam Cardiac: RRR, no murmurs. Respiratory: CTAB, normal effort, No wheezes, rales or rhonchi Extremities: no edema or cyanosis. Neuro: alert, no obvious focal deficits Psych: Normal affect and mood  ASSESSMENT/PLAN:   Foamy urine: Patient presented for follow-up for this.  I have seen her several times for this problem in the past and it done urinalysis and lab work which has been unrevealing.  She states that it continues to cause her distress and when she speaks to other individuals that do not have urine similar to hers and so she request a referral for urology.  She wants to ensure that there is nothing being missed.  Urinalysis today showed only trace blood.  We will place referral at patient's request for urology.  Colon cancer screening: She says she is not interested in a colonoscopy at this time.  We will order Cologuard for colon cancer  screening  Screening for hepatitis C: Will screen for hepatitis C today.  No concerns at this time  Jackelyn Poling, DO [facility]

## 2020-12-01 ENCOUNTER — Encounter: Payer: Self-pay | Admitting: Family Medicine

## 2020-12-01 ENCOUNTER — Ambulatory Visit (INDEPENDENT_AMBULATORY_CARE_PROVIDER_SITE_OTHER): Payer: Medicare HMO | Admitting: Family Medicine

## 2020-12-01 VITALS — BP 110/60 | HR 88 | Wt 186.5 lb

## 2020-12-01 DIAGNOSIS — R82998 Other abnormal findings in urine: Secondary | ICD-10-CM | POA: Diagnosis not present

## 2020-12-01 DIAGNOSIS — Z1211 Encounter for screening for malignant neoplasm of colon: Secondary | ICD-10-CM | POA: Diagnosis not present

## 2020-12-01 DIAGNOSIS — Z1159 Encounter for screening for other viral diseases: Secondary | ICD-10-CM

## 2020-12-01 DIAGNOSIS — Z23 Encounter for immunization: Secondary | ICD-10-CM

## 2020-12-01 LAB — POCT URINALYSIS DIP (MANUAL ENTRY)
Bilirubin, UA: NEGATIVE
Glucose, UA: NEGATIVE mg/dL
Ketones, POC UA: NEGATIVE mg/dL
Leukocytes, UA: NEGATIVE
Nitrite, UA: NEGATIVE
Protein Ur, POC: NEGATIVE mg/dL
Spec Grav, UA: 1.025 (ref 1.010–1.025)
Urobilinogen, UA: 0.2 E.U./dL
pH, UA: 5.5 (ref 5.0–8.0)

## 2020-12-01 NOTE — Patient Instructions (Signed)
For the concern of bubbles in your urine I am referring you to urology at your request.  They should call you within the next 1 to 2 weeks to set up an appointment for down the road.  I have ordered a Cologuard for colon cancer screening and they should have this mailed to your house in the next few weeks with instructions about how to submit it.  If you do not hear from them please let us know.  We are going screen for hepatitis C with blood work today and I will let you know your result when it returns.

## 2020-12-02 ENCOUNTER — Encounter: Payer: Self-pay | Admitting: Family Medicine

## 2020-12-02 LAB — HEPATITIS C ANTIBODY: Hep C Virus Ab: <0.1 s/co ratio (ref 0.0–0.9)

## 2020-12-04 NOTE — Progress Notes (Signed)
  Subjective:  Patient ID: Linda Arnold, female    DOB: Feb 28, 1951,  MRN: [id number]  Chief Complaint  Patient presents with   Nail Problem    Thick painful toenails, 3 month follow up   Callouses    Painful corns    70 y.o. female in for follow-up with the above complaint. History confirmed with patient.  No new issues since last visit.  The calluses have returned.  She has been using urea cream which has helped quite a bit and softened the callus.  Objective:  Physical Exam: warm, good capillary refill, no trophic changes or ulcerative lesions, normal DP and PT pulses and normal sensory exam.  Onychomycosis x10 Left Foot: Hallux valgus with bunion present, semireducible hammertoes present Right Foot: Mild hallux valgus with bunion present, semireducible hammertoes present, tailor's bunion present, hyperkeratosis submet 2-3, submet 5, submet 5 is very painful    Assessment:   1. Pain due to onychomycosis of toenails of both feet   2. Callus of foot   3. Diabetes mellitus type 2, noninsulin dependent (HCC)      Plan:  Patient was evaluated and treated and all questions answered.  Patient educated on diabetes. Discussed proper diabetic foot care and discussed risks and complications of disease. Educated patient in depth on reasons to return to the office immediately should he/she discover anything concerning or new on the feet. All questions answered. Discussed proper shoes as well.  She would benefit from diabetic shoes due to her hyperkeratotic preulcerative lesions and foot deformities.  All symptomatic hyperkeratoses were safely debrided with a sterile #15 blade to patient's level of comfort without incident. We discussed preventative and palliative care of these lesions including supportive and accommodative shoegear, padding, prefabricated and custom molded accommodative orthoses, use of a pumice stone and lotions/creams daily.  Discussed the etiology and treatment options  for the condition in detail with the patient. Educated patient on the topical and oral treatment options for mycotic nails. Recommended debridement of the nails today. Sharp and mechanical debridement performed of all painful and mycotic nails today. Nails debrided in length and thickness using a nail nipper and a mechanical burr to level of comfort. Discussed treatment options including appropriate shoe gear. Follow up as needed for painful nails.   Bunions still and only intermittently bothersome she will continue nonsurgical treatment     Return in about 3 months (around 03/01/2021) for at risk diabetic foot care.

## 2020-12-09 ENCOUNTER — Telehealth: Payer: Self-pay | Admitting: *Deleted

## 2020-12-09 ENCOUNTER — Other Ambulatory Visit: Payer: Self-pay | Admitting: Family Medicine

## 2020-12-09 DIAGNOSIS — Z1231 Encounter for screening mammogram for malignant neoplasm of breast: Secondary | ICD-10-CM

## 2020-12-09 DIAGNOSIS — Z1211 Encounter for screening for malignant neoplasm of colon: Secondary | ICD-10-CM | POA: Diagnosis not present

## 2020-12-09 NOTE — Telephone Encounter (Signed)
Patient called requesting an order for her DEXA be sent to the [facility].  Will send to provider to place this.  Shynice Sigel,CMA

## 2020-12-10 ENCOUNTER — Other Ambulatory Visit: Payer: Self-pay | Admitting: Family Medicine

## 2020-12-10 DIAGNOSIS — Z13820 Encounter for screening for osteoporosis: Secondary | ICD-10-CM

## 2020-12-14 LAB — COLOGUARD: Cologuard: NEGATIVE

## 2021-01-04 DIAGNOSIS — Z7984 Long term (current) use of oral hypoglycemic drugs: Secondary | ICD-10-CM | POA: Diagnosis not present

## 2021-01-04 DIAGNOSIS — E1100 Type 2 diabetes mellitus with hyperosmolarity without nonketotic hyperglycemic-hyperosmolar coma (NKHHC): Secondary | ICD-10-CM | POA: Diagnosis not present

## 2021-01-04 DIAGNOSIS — I10 Essential (primary) hypertension: Secondary | ICD-10-CM | POA: Diagnosis not present

## 2021-01-04 DIAGNOSIS — E785 Hyperlipidemia, unspecified: Secondary | ICD-10-CM | POA: Diagnosis not present

## 2021-01-14 ENCOUNTER — Other Ambulatory Visit: Payer: Self-pay

## 2021-01-14 ENCOUNTER — Ambulatory Visit
Admission: RE | Admit: 2021-01-14 | Discharge: 2021-01-14 | Disposition: A | Discharge status: Home / Self Care | Payer: Medicare HMO | Source: Ambulatory Visit | Attending: Family Medicine | Admitting: Family Medicine

## 2021-01-14 DIAGNOSIS — Z780 Asymptomatic menopausal state: Secondary | ICD-10-CM | POA: Diagnosis not present

## 2021-01-14 DIAGNOSIS — Z13820 Encounter for screening for osteoporosis: Secondary | ICD-10-CM

## 2021-01-19 DIAGNOSIS — R3915 Urgency of urination: Secondary | ICD-10-CM | POA: Diagnosis not present

## 2021-01-19 DIAGNOSIS — R82998 Other abnormal findings in urine: Secondary | ICD-10-CM | POA: Diagnosis not present

## 2021-01-19 DIAGNOSIS — N3941 Urge incontinence: Secondary | ICD-10-CM | POA: Diagnosis not present

## 2021-01-26 NOTE — Progress Notes (Signed)
    SUBJECTIVE:   CHIEF COMPLAINT / HPI:   Urology follow-up: 70 year old female presenting for follow-up from urology.  At her previous appointment she had had some concern of bubbles in her urine and so we had checked urinalysis as well as creatinine levels.  We checked urinalysis on several occasions which were essentially negative.  This showed no protein, no nitrite, no leuk esterase, no glucose.  Her protein creatinine ratio was low.  Most recent creatinine of 0.84.  Due to ongoing patient concern and request for referral to urology we did place that referral at her last appointment.  Today she states that she has seen urology and they told her that she had some protein in the urine.  She does not know how much protein she had in the urine and they did not give her any recommendations other than tell her to follow-up with her primary doctor.  She states they also did an ultrasound of her bladder but does not know if they saw anything of note.  She continues to endorse bubbles in her urine which has not changed.  PERTINENT  PMH / PSH: None relevant  OBJECTIVE:   BP (!) 145/87   Pulse 88   Ht 5\' 3"  (1.6 m)   Wt 190 lb 2 oz (86.2 kg)   SpO2 100%   BMI 33.68 kg/m    142/80 on recheck  General: NAD, pleasant, able to participate in exam Respiratory: No respiratory distress Skin: warm and dry, no rashes noted Psych: Normal affect and mood   ASSESSMENT/PLAN:   Hypertension Blood pressure of 145/87 today, 142/80 on manual recheck.  She endorses taking her losartan daily.  She did just eat a large meal and was rushed with the holidays.  She checked her blood pressure yesterday and she states it was low but she does not remember exactly what it was.  Recommend she check her pressures over the next week or so and follow-up in 1 to 2 weeks for pressure recheck.   Bubbles in urine: Endorses this continues to occur.  We have checked several urinalysis as well as creatinine in the past for  evaluation.  We did refer to urology and she states that urology did some testing and sent her back to PCP, however we do not have any access to those records.  We will request urology records as we are not able to see those in our EMR.  Previous urinalysis did not show any protein at our office.  Most recent creatinine within normal limits.  We will follow-up with patient once we receive the results of her most recent urology visit.    , DO [facility]

## 2021-01-27 ENCOUNTER — Ambulatory Visit (INDEPENDENT_AMBULATORY_CARE_PROVIDER_SITE_OTHER): Payer: Medicare HMO | Admitting: Family Medicine

## 2021-01-27 ENCOUNTER — Encounter: Payer: Self-pay | Admitting: Family Medicine

## 2021-01-27 ENCOUNTER — Other Ambulatory Visit: Payer: Self-pay

## 2021-01-27 VITALS — BP 145/87 | HR 88 | Ht 63.0 in | Wt 190.1 lb

## 2021-01-27 DIAGNOSIS — G4730 Sleep apnea, unspecified: Secondary | ICD-10-CM

## 2021-01-27 DIAGNOSIS — I10 Essential (primary) hypertension: Secondary | ICD-10-CM | POA: Diagnosis not present

## 2021-01-27 DIAGNOSIS — R82998 Other abnormal findings in urine: Secondary | ICD-10-CM

## 2021-01-27 NOTE — Patient Instructions (Signed)
We filled out a piece paper today so we can get the records from urology.  I would like to see you back in about 2 weeks once we get those records so we can see if they had any recommendations.  Your blood pressure was little bit high today.  I would like for you to check your pressures at home and I would like to see back in about a week and a half or 2 weeks from now to make sure your pressure looks okay at that time.

## 2021-01-27 NOTE — Assessment & Plan Note (Signed)
Blood pressure of 145/87 today, 142/80 on manual recheck.  She endorses taking her losartan daily.  She did just eat a large meal and was rushed with the holidays.  She checked her blood pressure yesterday and she states it was low but she does not remember exactly what it was.  Recommend she check her pressures over the next week or so and follow-up in 1 to 2 weeks for pressure recheck.

## 2021-02-08 ENCOUNTER — Other Ambulatory Visit: Payer: Self-pay

## 2021-02-08 MED ORDER — PRAVASTATIN SODIUM 40 MG PO TABS: 40.0000 mg | ORAL_TABLET | Freq: Every day | ORAL | 1 refills | Status: DC

## 2021-02-08 NOTE — Progress Notes (Signed)
    SUBJECTIVE:   CHIEF COMPLAINT / HPI:   Follow-up-foamy urine: 70 year old female presenting for follow-up for the above.  Her previous urinalysis were essentially normal with no signs of protein in the urine.  Due to patient concern we referred her to urology.  They performed an evaluation including a bladder ultrasound and urinalysis which showed 1+ protein.  There were no further recommendations other than continue to follow-up with primary physician.  I reached out to urology prior to the appointment and discussed the case with them who recommended no specific follow-up other than for the 1+ protein with possibility of continue to monitor versus nephrology consult. Previous blood testing including protein level was within normal limits.   Diabetic Follow Up: Patient is a 70 y.o. female who present today for diabetic follow up.   Patient endorses no problems  Home medications include: Metformin 500 mg twice daily Patient endorses taking these medications as prescribed.  Most recent A1Cs:  Lab Results  Component Value Date   HGBA1C 5.9 02/12/2021   HGBA1C 6.3 (H) 07/23/2020   HGBA1C 6.1 (H) 08/16/2018   Last Microalbumin, LDL, Creatinine: Lab Results  Component Value Date   MICROALBUR 0.50 06/19/2008   LDLCALC 94 08/16/2018   CREATININE 0.84 07/23/2020   Patient does not check blood glucose on a regular basis.  Patient is not up to date on diabetic eye. Patient is not up to date on diabetic foot exam.  HTN: Restarted losartan about a week ago but sometimes forgets to take it. Didn't take it last night.   PERTINENT  PMH / PSH: T2DM  OBJECTIVE:   BP (!) 138/112 Comment: her cuff 142/85  Pulse 86   Wt 185 lb 6.4 oz (84.1 kg)   SpO2 98%   BMI 32.84 kg/m    142/90 bp recheck  Diabetic foot exam was performed.  No deformities or other abnormal visual findings.  Posterior tibialis and dorsalis pulse intact bilaterally.  Intact to touch and monofilament testing  bilaterally.   General: NAD, pleasant, able to participate in exam Respiratory: No respiratory distress Skin: warm and dry, no rashes noted Psych: Normal affect and mood   ASSESSMENT/PLAN:   Diabetes mellitus type 2, noninsulin dependent (HCC) A1c today of 5.9.  Previously 6.3.  Continues on metformin.  Diabetic foot exam performed today.  Recommended yearly diabetic eye exam.  Hypertension Blood pressure elevated today at 138/112, 142/90 on manual recheck.  She did not take her medication last night.  She takes losartan 25 mg daily.  Gave her the option of increasing that medication versus taking it regularly and following up in a week and she opted to follow-up in 1 week.  We will check a BMP today as she recently restarted this losartan about a week ago.  Follow-up in 1 week.   Bubbles in urine: Patient has been seen and evaluated by urology.  Urinalysis have been mostly normal however the one with urology did show 1+ protein.  I think it is reasonable to continue to monitor this and discussed this with the patient that we could do a follow-up about every 6 months for urinalysis and make sure she is doing all right.  Jackelyn Poling, DO [facility]

## 2021-02-12 ENCOUNTER — Ambulatory Visit (INDEPENDENT_AMBULATORY_CARE_PROVIDER_SITE_OTHER): Payer: Medicare HMO | Admitting: Family Medicine

## 2021-02-12 ENCOUNTER — Other Ambulatory Visit: Payer: Self-pay

## 2021-02-12 ENCOUNTER — Encounter: Payer: Self-pay | Admitting: Family Medicine

## 2021-02-12 VITALS — BP 138/112 | HR 86 | Wt 185.4 lb

## 2021-02-12 DIAGNOSIS — R82998 Other abnormal findings in urine: Secondary | ICD-10-CM

## 2021-02-12 DIAGNOSIS — E119 Type 2 diabetes mellitus without complications: Secondary | ICD-10-CM

## 2021-02-12 DIAGNOSIS — I10 Essential (primary) hypertension: Secondary | ICD-10-CM

## 2021-02-12 LAB — POCT GLYCOSYLATED HEMOGLOBIN (HGB A1C): HbA1c, POC (controlled diabetic range): 5.9 % (ref 0.0–7.0)

## 2021-02-12 NOTE — Patient Instructions (Signed)
Your A1c today was5.8.  I recommend that you do an annual diabetic eye exam.  Continue on your metformin and we should recheck this in about 3 months.  If it is good at that time we can discuss scaling back on your medication..  We performed a diabetic foot exam today.  As discussed I think it is reasonable to follow-up and check your urine about every 6 months.  Of course if you start having change in symptoms we should see you back sooner.  For your high blood pressure I would not make sure you take your losartan on a daily basis.  We will check some blood work today and I will call you when the results return.  I will see back in about a week to check your blood pressure.  If it is still elevated we may increase your losartan medication

## 2021-02-12 NOTE — Assessment & Plan Note (Signed)
Blood pressure elevated today at 138/112, 142/90 on manual recheck.  She did not take her medication last night.  She takes losartan 25 mg daily.  Gave her the option of increasing that medication versus taking it regularly and following up in a week and she opted to follow-up in 1 week.  We will check a BMP today as she recently restarted this losartan about a week ago.  Follow-up in 1 week.

## 2021-02-12 NOTE — Assessment & Plan Note (Signed)
A1c today of 5.9.  Previously 6.3.  Continues on metformin.  Diabetic foot exam performed today.  Recommended yearly diabetic eye exam.

## 2021-02-13 LAB — BASIC METABOLIC PANEL
BUN/Creatinine Ratio: 14 (ref 12–28)
BUN: 12 mg/dL (ref 8–27)
CO2: 25 mmol/L (ref 20–29)
Calcium: 9.3 mg/dL (ref 8.7–10.3)
Chloride: 103 mmol/L (ref 96–106)
Creatinine, Ser: 0.83 mg/dL (ref 0.57–1.00)
Glucose: 134 mg/dL — ABNORMAL HIGH (ref 70–99)
Potassium: 4.4 mmol/L (ref 3.5–5.2)
Sodium: 145 mmol/L — ABNORMAL HIGH (ref 134–144)
eGFR: 75 mL/min/{1.73_m2} (ref >59)

## 2021-02-15 ENCOUNTER — Encounter: Payer: Self-pay | Admitting: Family Medicine

## 2021-02-18 NOTE — Progress Notes (Signed)
° ° °  SUBJECTIVE:   CHIEF COMPLAINT / HPI:   Blood pressure follow-up: At last appointment patient was noted to have an elevated blood pressure of 142/90.  A BMP was checked as she recently restarted losartan about a week ago.  It was noted that she had not taken her medication the night before the blood pressure check and so the plan was to follow-up in 1 week to determine if we need to increase the dose of losartan or not.  She continues on 25 mg daily.  PERTINENT  PMH / PSH: Hypertension  OBJECTIVE:   BP 134/76    Pulse 84    Ht 5\' 3"  (1.6 m)    Wt 185 lb 3.2 oz (84 kg)    SpO2 100%    BMI 32.81 kg/m    General: NAD, pleasant, able to participate in exam Respiratory: No respiratory distress Skin: warm and dry, no rashes noted Psych: Normal affect and mood  ASSESSMENT/PLAN:   Hypertension Blood pressure today of 134/76.  Currently on losartan 25 mg daily.  It was previously elevated when she was not taking her medications.  She endorses taking this regularly now.  She does have a blood pressure cuff at home and plans to measure.  She is going with me know if she sees any regular numbers over 130 systolic.  She is going to follow-up in about 4 to 6 months for A1c recheck and we will check her blood pressure again at that time unless she has elevations at home before hand    , DO [facility]

## 2021-02-19 ENCOUNTER — Ambulatory Visit (INDEPENDENT_AMBULATORY_CARE_PROVIDER_SITE_OTHER): Payer: Medicare HMO

## 2021-02-19 ENCOUNTER — Other Ambulatory Visit: Payer: Self-pay

## 2021-02-19 ENCOUNTER — Ambulatory Visit (INDEPENDENT_AMBULATORY_CARE_PROVIDER_SITE_OTHER): Payer: Medicare HMO | Admitting: Family Medicine

## 2021-02-19 ENCOUNTER — Encounter: Payer: Self-pay | Admitting: Family Medicine

## 2021-02-19 VITALS — BP 134/76 | HR 84 | Ht 63.0 in | Wt 185.2 lb

## 2021-02-19 DIAGNOSIS — I10 Essential (primary) hypertension: Secondary | ICD-10-CM | POA: Diagnosis not present

## 2021-02-19 DIAGNOSIS — Z23 Encounter for immunization: Secondary | ICD-10-CM | POA: Diagnosis not present

## 2021-02-19 NOTE — Patient Instructions (Signed)
Your blood pressure is much better today.  I would like for you to continue checking it at home.  If you regularly see numbers above 130 for the top number please let me know.  I would like to see you back in 4 to 6 months for an A1c check or sooner if you need anything else or if you are seeing blood pressure numbers that are higher than 130 for the top number.

## 2021-02-19 NOTE — Assessment & Plan Note (Signed)
Blood pressure today of 134/76.  Currently on losartan 25 mg daily.  It was previously elevated when she was not taking her medications.  She endorses taking this regularly now.  She does have a blood pressure cuff at home and plans to measure.  She is going with me know if she sees any regular numbers over 130 systolic.  She is going to follow-up in about 4 to 6 months for A1c recheck and we will check her blood pressure again at that time unless she has elevations at home before hand

## 2021-03-02 ENCOUNTER — Other Ambulatory Visit: Payer: Self-pay

## 2021-03-02 ENCOUNTER — Ambulatory Visit (INDEPENDENT_AMBULATORY_CARE_PROVIDER_SITE_OTHER): Payer: Medicare HMO | Admitting: Podiatry

## 2021-03-02 DIAGNOSIS — B351 Tinea unguium: Secondary | ICD-10-CM

## 2021-03-02 DIAGNOSIS — M79675 Pain in left toe(s): Secondary | ICD-10-CM | POA: Diagnosis not present

## 2021-03-02 DIAGNOSIS — M79674 Pain in right toe(s): Secondary | ICD-10-CM

## 2021-03-02 DIAGNOSIS — L84 Corns and callosities: Secondary | ICD-10-CM

## 2021-03-02 DIAGNOSIS — E119 Type 2 diabetes mellitus without complications: Secondary | ICD-10-CM

## 2021-03-08 ENCOUNTER — Encounter: Payer: Self-pay | Admitting: Podiatry

## 2021-03-08 NOTE — Progress Notes (Signed)
°  Subjective:  Patient ID: Linda Arnold, female    DOB: 04-07-1950,  MRN: [id number]  Chief Complaint  Patient presents with   debride    DFC -FBS: 109 a1C: 7.3 PCP: [facility] x 1 wk     71 y.o. female in for follow-up with the above complaint. History confirmed with patient.  No new issues since last visit.  The calluses are still giving her pain but she has been using urea cream which has helped quite a bit and softened the callus.  Nails are thickened elongated and painful again  Objective:  Physical Exam: warm, good capillary refill, no trophic changes or ulcerative lesions, normal DP and PT pulses and normal sensory exam.  Onychomycosis x10 Left Foot: Hallux valgus with bunion present, semireducible hammertoes present Right Foot: Mild hallux valgus with bunion present, semireducible hammertoes present, tailor's bunion present, hyperkeratosis submet 2-3, submet 5, submet 5 is very painful    Assessment:   1. Pain due to onychomycosis of toenails of both feet   2. Callus of foot   3. Diabetes mellitus type 2, noninsulin dependent (HCC)      Plan:  Patient was evaluated and treated and all questions answered.  Patient educated on diabetes. Discussed proper diabetic foot care and discussed risks and complications of disease. Educated patient in depth on reasons to return to the office immediately should he/she discover anything concerning or new on the feet. All questions answered. Discussed proper shoes as well.  She would benefit from diabetic shoes due to her hyperkeratotic preulcerative lesions and foot deformities.  All symptomatic hyperkeratoses were safely debrided with a sterile #15 blade to patient's level of comfort without incident. We discussed preventative and palliative care of these lesions including supportive and accommodative shoegear, padding, prefabricated and custom molded accommodative orthoses, use of a pumice stone and lotions/creams daily.  Discussed the  etiology and treatment options for the condition in detail with the patient. Educated patient on the topical and oral treatment options for mycotic nails. Recommended debridement of the nails today. Sharp and mechanical debridement performed of all painful and mycotic nails today. Nails debrided in length and thickness using a nail nipper and a mechanical burr to level of comfort. Discussed treatment options including appropriate shoe gear. Follow up as needed for painful nails.   Bunions still and only intermittently bothersome she will continue nonsurgical treatment     No follow-ups on file.

## 2021-03-11 ENCOUNTER — Other Ambulatory Visit: Payer: Self-pay

## 2021-03-11 ENCOUNTER — Ambulatory Visit
Admission: RE | Admit: 2021-03-11 | Discharge: 2021-03-11 | Disposition: A | Discharge status: Home / Self Care | Payer: Medicare HMO | Source: Ambulatory Visit | Attending: Family Medicine | Admitting: Family Medicine

## 2021-03-11 DIAGNOSIS — Z1231 Encounter for screening mammogram for malignant neoplasm of breast: Secondary | ICD-10-CM | POA: Diagnosis not present

## 2021-03-12 DIAGNOSIS — E119 Type 2 diabetes mellitus without complications: Secondary | ICD-10-CM | POA: Diagnosis not present

## 2021-03-12 DIAGNOSIS — H524 Presbyopia: Secondary | ICD-10-CM | POA: Diagnosis not present

## 2021-03-12 DIAGNOSIS — H52223 Regular astigmatism, bilateral: Secondary | ICD-10-CM | POA: Diagnosis not present

## 2021-03-12 DIAGNOSIS — H5203 Hypermetropia, bilateral: Secondary | ICD-10-CM | POA: Diagnosis not present

## 2021-03-12 DIAGNOSIS — H509 Unspecified strabismus: Secondary | ICD-10-CM | POA: Diagnosis not present

## 2021-03-16 IMAGING — MR MRI HEAD WITHOUT CONTRAST
9 of 10 series · 36 of 48 positions shown · non-contrast
Comparison: Head CT 08/14/2018

CLINICAL DATA: Encephalopathy. Acute memory loss/confusion
yesterday.

EXAM:
MRI HEAD WITHOUT CONTRAST
TECHNIQUE: Multiplanar, multiecho pulse sequences of the brain and surrounding
structures were obtained without intravenous contrast.

[Series 3: DWI · axial · 3.0mm · 1.09mm/px · z∈[-40,+88]mm · 8 of 90 slices shown (1 of 4)]
[im 1/90]
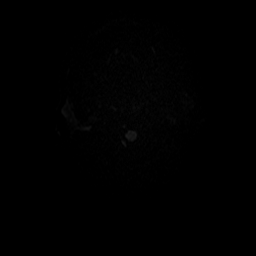
[im 10/90]
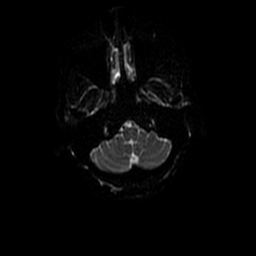
[im 30/90]
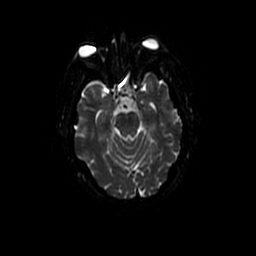
[im 40/90]
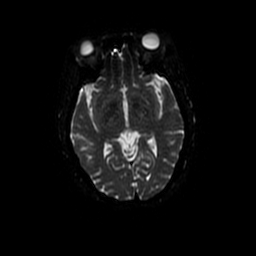
[im 50/90]
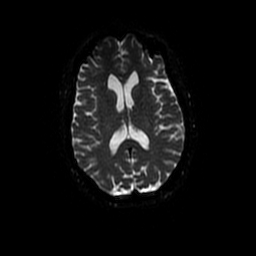
[im 60/90]
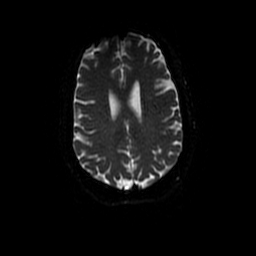
[im 80/90]
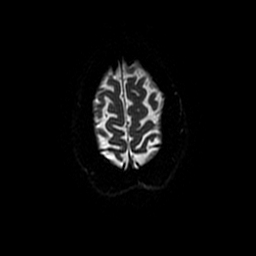
[im 90/90]
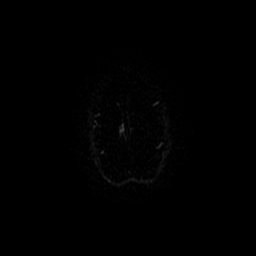

[Series 4: DWI · coronal · 5.0mm · 1.09mm/px · 8 of 72 slices shown (2 of 4)]
[im 1/72]
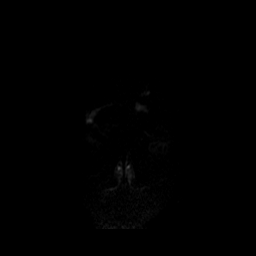
[im 11/72]
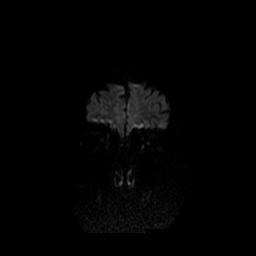
[im 21/72]
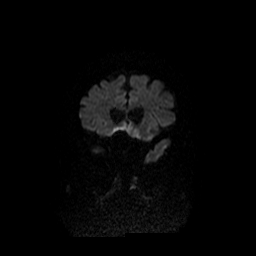
[im 31/72]
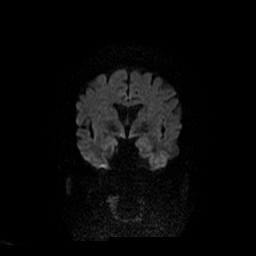
[im 41/72]
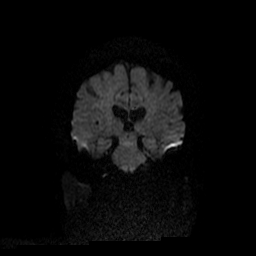
[im 51/72]
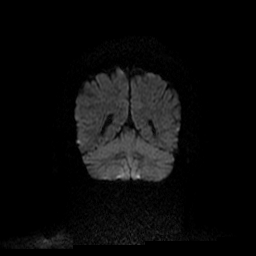
[im 61/72]
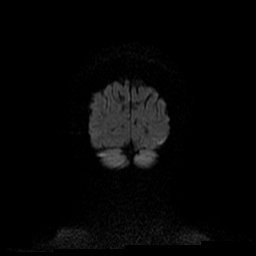
[im 72/72]
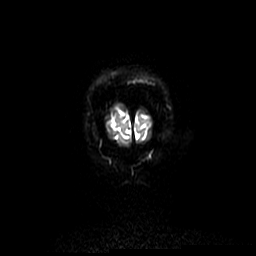

[Series 5: T1 · sagittal · 5.0mm · 0.47mm/px · 2 of 23 slices shown (1 of 2)]
[im 1/23]
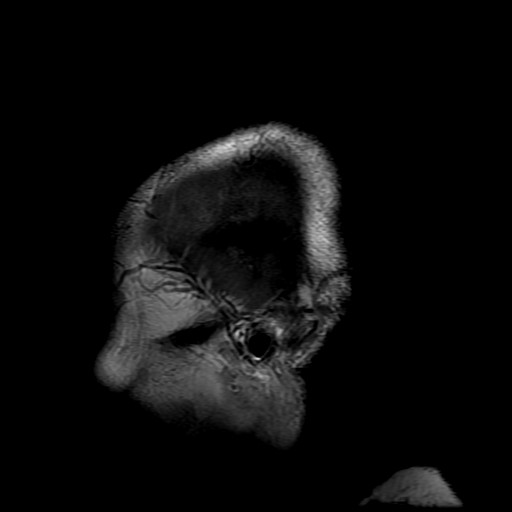
[im 23/23]
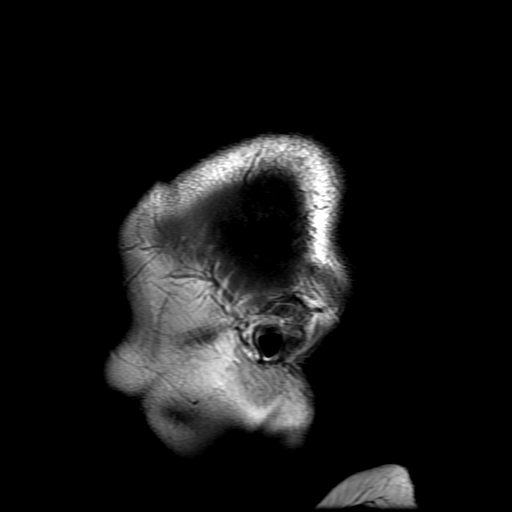

[Series 6: T2 · axial · 5.0mm · 0.47mm/px · z∈[-43,+89]mm · 2 of 24 slices shown (1 of 2)]
[im 1/24]
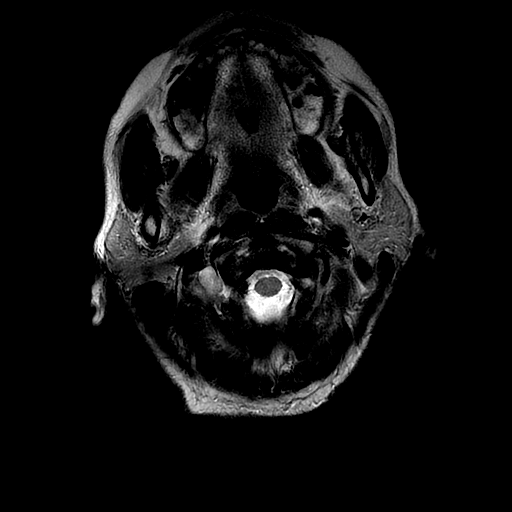
[im 24/24]
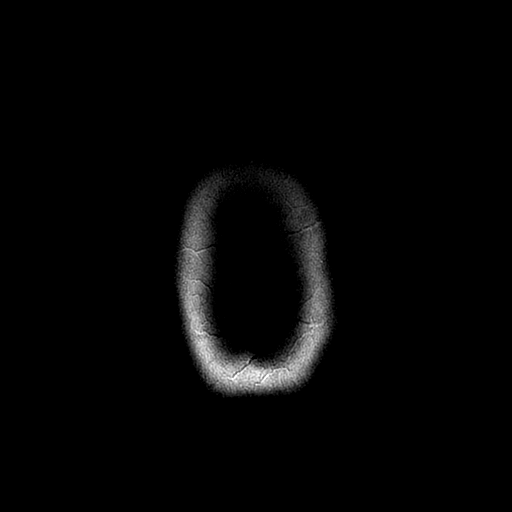

[Series 7: FLAIR · axial · 3.0mm · 0.47mm/px · z∈[-43,+89]mm · 2 of 24 slices shown]
[im 1/24]
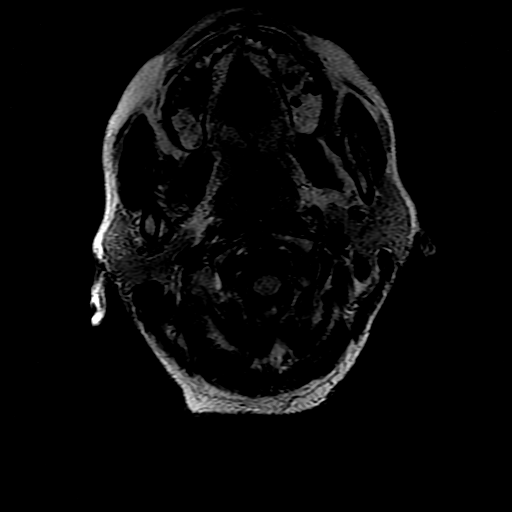
[im 24/24]
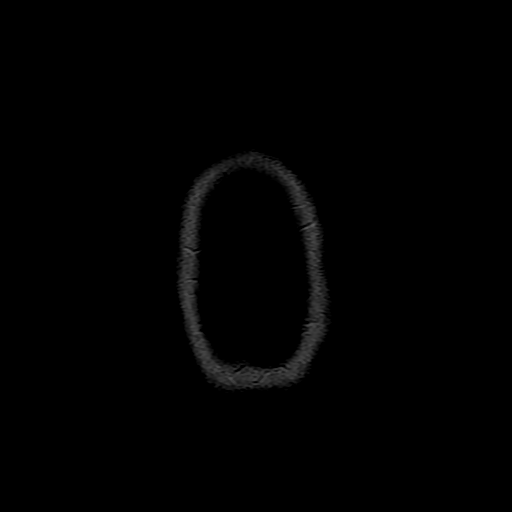

[Series 9: T1 · axial · 3.0mm · 0.47mm/px · z∈[-36,-20]mm · 2 of 100 slices shown (2 of 2)]
[im 1/100]
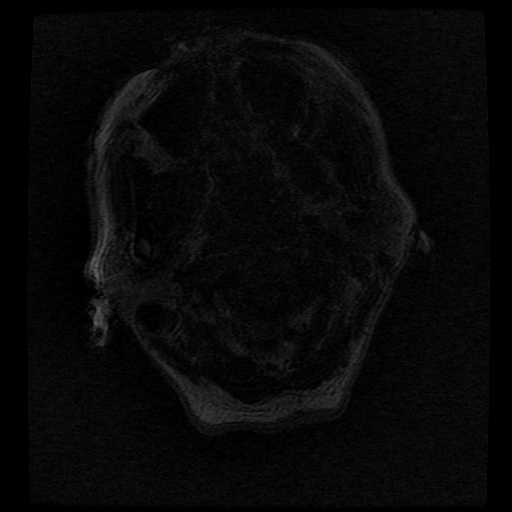
[im 12/100]
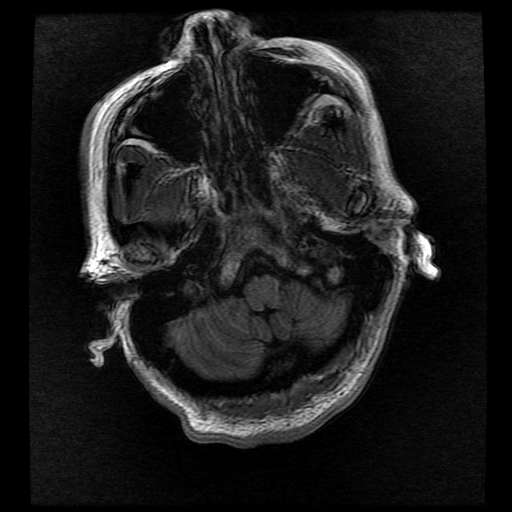

[Series 11: T2 · coronal · 5.0mm · 0.39mm/px · 3 of 27 slices shown (2 of 2)]
[im 1/27]
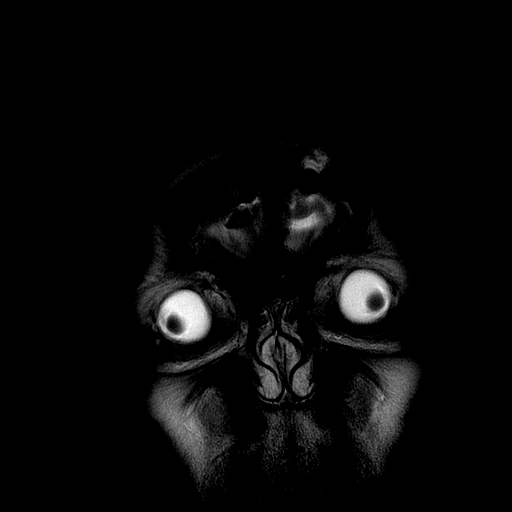
[im 14/27]
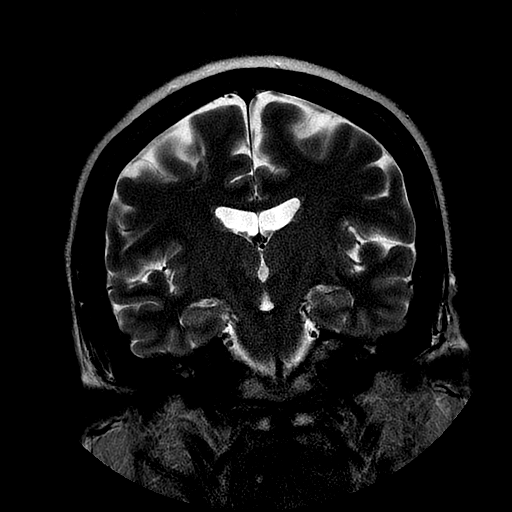
[im 27/27]
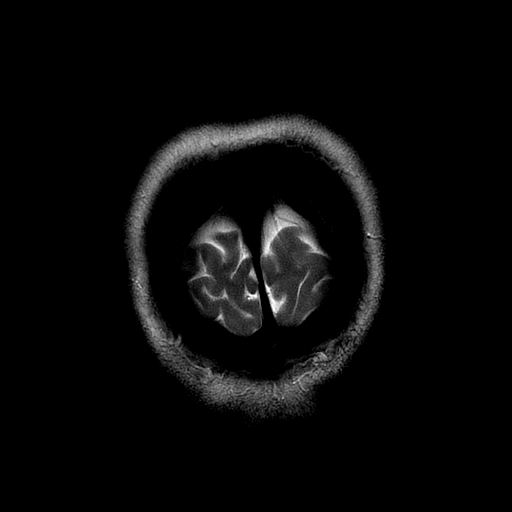

[Series 300: DWI · axial · 3.0mm · 1.09mm/px · z∈[-40,+88]mm · 5 of 45 slices shown (3 of 4)]
[im 1/45]
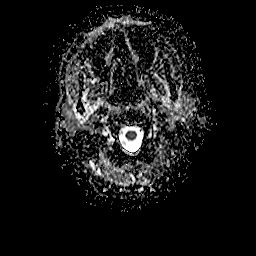
[im 12/45]
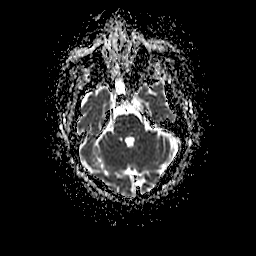
[im 23/45]
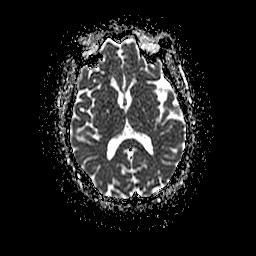
[im 34/45]
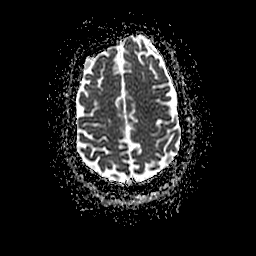
[im 45/45]
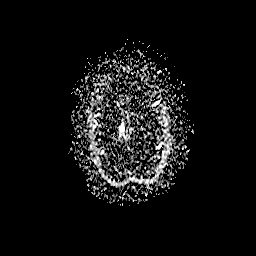

[Series 400: DWI · coronal · 5.0mm · 1.09mm/px · 4 of 36 slices shown (4 of 4)]
[im 1/36]
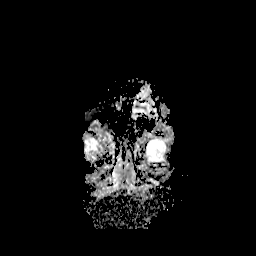
[im 12/36]
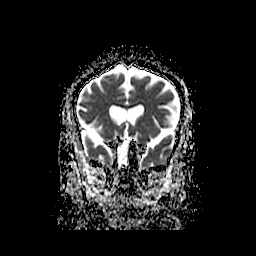
[im 24/36]
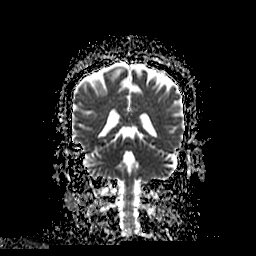
[im 36/36]
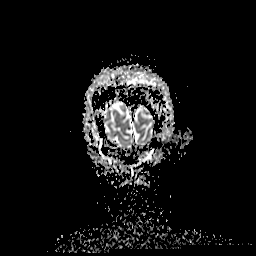

[36 of 48 positions shown; findings below may reference images not displayed]

FINDINGS: Brain: There is a 2 mm focus of mildly increased trace diffusion
weighted signal involving cortex of the right precentral gyrus
(series 3, image 36) on axial imaging. Assessment for reduced ADC is
limited by small size, and assessment on coronal imaging is limited
by slice selection. Single punctate foci of mildly increased trace
diffusion signal in the posteromedial temporal lobes bilaterally are
favored to be artifactual given bilaterality. No acute large
territory infarct, intracranial hemorrhage, mass, midline shift, or
extra-axial fluid collection is identified. A single subcentimeter
focus of subcortical T2/FLAIR hyperintensity in the right frontal
operculum is not considered abnormal for age. Mild cerebral atrophy
is within normal limits for age.

Vascular: Major intracranial vascular flow voids are preserved.

Skull and upper cervical spine: No suspicious marrow lesion.

Sinuses/Orbits: Unremarkable orbits. Small sphenoid sinus mucous
retention cysts. Clear mastoid air cells.

Other: None.
IMPRESSION: 1. Punctate acute/subacute infarct versus artifact in the right
precentral gyrus.
2. Otherwise unremarkable appearance of the brain for age.

## 2021-03-31 ENCOUNTER — Other Ambulatory Visit: Payer: Self-pay | Admitting: Family Medicine

## 2021-04-28 ENCOUNTER — Other Ambulatory Visit: Payer: Self-pay | Admitting: Family Medicine

## 2021-05-03 ENCOUNTER — Other Ambulatory Visit: Payer: Self-pay

## 2021-05-03 ENCOUNTER — Telehealth: Payer: Self-pay

## 2021-05-20 ENCOUNTER — Encounter: Payer: Self-pay | Admitting: Neurology

## 2021-05-20 ENCOUNTER — Ambulatory Visit: Payer: Medicare HMO | Admitting: Neurology

## 2021-05-20 VITALS — BP 124/84 | HR 84 | Ht 63.0 in | Wt 181.5 lb

## 2021-05-20 DIAGNOSIS — G3184 Mild cognitive impairment, so stated: Secondary | ICD-10-CM | POA: Diagnosis not present

## 2021-05-20 DIAGNOSIS — G454 Transient global amnesia: Secondary | ICD-10-CM | POA: Diagnosis not present

## 2021-05-20 NOTE — Patient Instructions (Signed)
Great to see you today ?Continue aspirin 81 mg daily ?Continue to see your primary care doctor ?See you back in 1 year  ?

## 2021-05-20 NOTE — Progress Notes (Signed)
? ? ?PATIENT: Linda Arnold ?DOB: 03-Oct-1950 ? ?REASON FOR VISIT: follow up for memory ?HISTORY FROM: patient ?PRIMARY NEUROLOGIST: Dr. Terrace Arabia  ? ?HISTORY ? Linda Arnold is a 71 year old female, seen in request by her primary care physician Dr. Parke Simmers, Adrian Saran for evaluation of memory loss, initial evaluation was on September 18, 2018. ?  ?I have reviewed and summarized the referring note from the referring physician.  She had a past medical history of diabetes, hyperlipidemia, she used to work at [employer] job, did daycare work for about 3 years, she lives alone, still drives, around 2008, she noticed gradual onset memory loss, initially forget people's name, telephone number, gradually getting worse, she forgot what she would have for breath first quickly ?  ?She was admitted to the hospital on August 14, 2018, while her sister talked with her, she was noted to repeating questions, confused, her sister came over to check on her, realize she was very confused, did not know what was going on, her sister called EMS, she was admitted to the hospital, she cannot remember the ride to hospital, woke up few hours later realized that she is in hospital. ?  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 ?  ?Laboratory evaluations showed normal lipid profile, A1c was 6.1, BMP showed calcium 8.4, normal CBC, ?Echocardiogram on August 15 2018, showed ejection fraction 60 to 65%, ? ?UPDATE May 20 2019: ?She is overall doing very well, there is no longer confusional episode, she continue complains of intermittent mild short-term memory loss, she gave me the example, while talking with her friend, she could not think of what breakfast she had, but she can recall it few minutes later. ? ?EEG was normal in July 2020, ? ?Update  December 20, 2018 SS: She had a normal EEG in July 2020.  She has not had any further episodes of amnesia or prolonged memory loss.  She indicates she continues to report trouble with her short-term memory. Specifically, she records her daily food intake, often forgets what she ate for meals.  She lives alone, does her own ADLs, pays her bills, and drives a car without difficulty.  She walks 30 minutes daily.  Within the last month she was diagnosed with obstructive sleep apnea, has now started CPAP.  She presents today for follow-up unaccompanied.  She does report that she has noticed memory loss for the last 3 years. No family history of memory loss.  ? ?Update 05/20/2019 Dr. Terrace Arabia: TSH, RPR, Vitamin B12 were normal. ?I personally reviewed MRI of the brain in June 2020, mild supratentorium small vessel disease there was no acute abnormality. ? ?She has obstructive sleep apnea, faithfully using her CPAP machine every night, exercise regularly, drink at least 6 cups of water daily.  She drives to clinic today by memory, is able to handle her household bills without any difficulty. ? ?Update May 20, 2020 SS: Here today alone, feels short term memory is slipping. Lives alone, managing her household, finances, driving well. Ex: takes awhile to remember what she had for meals, comes to her later.. Hasn't given up anything because memory. No friends or family mention memory concerns for her. Using CPAP. Remains on aspirin 81 mg daily, exercise with walking. Sees PCP. MMSE 29/30 today. ? ?Update May 20, 2021 SS: here today  alone, lives alone, drives a car, manages her household alone well, 29/30 MMSE today. Still trouble with short term memory. No health issues. Walks 30 minutes everyday. Has extended family, doesn't have relationship with her son. Stays active with social activity, looks great today.  ? ?REVIEW OF SYSTEMS: Out of a complete 14 system review of symptoms, the patient complains only of the following  symptoms, and all other reviewed systems are negative. ? ?See HPI ? ?ALLERGIES: ?Allergies  ?Allergen Reactions  ? Betadine [Povidone Iodine] Rash  ? Lisinopril Rash  ? ? ?HOME MEDICATIONS: ?Outpatient Medications Prior to Visit  ?Medication Sig Dispense Refill  ? ASPIRIN 81 PO Take 81 mg by mouth daily.    ? Calcium Citrate (CITRACAL PO) Take 1 tablet by mouth daily.    ? losartan (COZAAR) 25 MG tablet TAKE 1 TABLET EVERY DAY 90 tablet 2  ? metFORMIN (GLUCOPHAGE) 500 MG tablet Take 1 tablet (500 mg total) by mouth 2 (two) times daily with a meal. 180 tablet 3  ? pravastatin (PRAVACHOL) 40 MG tablet TAKE 1 TABLET EVERY DAY 90 tablet 2  ? ?No facility-administered medications prior to visit.  ? ? ?PAST MEDICAL HISTORY: ?Past Medical History:  ?Diagnosis Date  ? Arthritis 1999  ? knees  ? Diabetes mellitus without complication (HCC) 2005  ? Dysphagia   ? Hyperlipidemia   ? Hypertension   ? Memory loss   ? ? ?PAST SURGICAL HISTORY: ?Past Surgical History:  ?Procedure Laterality Date  ? CESAREAN SECTION    ? TUBAL LIGATION    ? ? ?FAMILY HISTORY: ?Family History  ?Problem Relation Age of Onset  ? Diabetes Mother   ?     died at age 22  ? Congestive Heart Failure Mother   ? Diabetes Sister   ? Hyperlipidemia Sister   ? Hypertension Sister   ? Cancer Brother 26  ?     Lung cancer x2 brothers  ? Hypertension Brother   ? Other Father   ?     died in 15 from not being able to "pass his urine"  ? ? ?SOCIAL HISTORY: ?Social History  ? ?Socioeconomic History  ? Marital status: Widowed  ?  Spouse name: Not on file  ? Number of children: 1  ? Years of education: 22  ? Highest education level: Associate degree: academic program  ?Occupational History  ? Occupation: retired  ?Tobacco Use  ? Smoking status: Never  ? Smokeless tobacco: Never  ?Vaping Use  ? Vaping Use: Never used  ?Substance and Sexual Activity  ? Alcohol use: Never  ? Drug use: No  ? Sexual activity: Not Currently  ?Other Topics Concern  ? Not on file  ?Social  History Narrative  ? Patient lives alone in [place].   ? Patient is a widower and has one son located in [place].   ? Patient walks daily for exercise.   ? Patient enjoys reading, shopping, friends, and walking.   ? ?Social Determinants of Health  ? ?Financial Resource Strain: Low Risk   ? Difficulty of Paying Living Expenses: Not hard at all  ?Food Insecurity: No Food Insecurity  ? Worried About Programme researcher, broadcasting/film/video in the Last Year: Never true  ? Ran Out of Food in the Last Year: Never true  ?Transportation Needs: No Transportation Needs  ? Lack of Transportation (Medical): No  ? Lack of Transportation (Non-Medical): No  ?Physical Activity: Sufficiently Active  ? Days of Exercise per Week: 7 days  ?  Minutes of Exercise per Session: 30 min  ?Stress: No Stress Concern Present  ? Feeling of Stress : Only a little  ?Social Connections: Moderately Isolated  ? Frequency of Communication with Friends and Family: More than three times a week  ? Frequency of Social Gatherings with Friends and Family: More than three times a week  ? Attends Religious Services: More than 4 times per year  ? Active Member of Clubs or Organizations: No  ? Attends BankerClub or Organization Meetings: Never  ? Marital Status: Widowed  ?Intimate Partner Violence: Not At Risk  ? Fear of Current or Ex-Partner: No  ? Emotionally Abused: No  ? Physically Abused: No  ? Sexually Abused: No  ? ?PHYSICAL EXAM ? ?Vitals:  ? 05/20/21 1240  ?BP: 124/84  ?Pulse: 84  ?Weight: 181 lb 8 oz (82.3 kg)  ?Height: 5\' 3"  (1.6 m)  ? ? ?Body mass index is 32.15 kg/m?. ? ?Generalized: Well developed, in no acute distress, well-appearing ?MMSE - Mini Mental State Exam 05/20/2021 05/20/2020 05/20/2019  ?Orientation to time 5 5 5   ?Orientation to Place 5 5 5   ?Registration 3 3 3   ?Attention/ Calculation 4 5 5   ?Recall 3 2 3   ?Language- name 2 objects 2 2 2   ?Language- repeat 1 1 1   ?Language- follow 3 step command 3 3 3   ?Language- read & follow direction 1 1 1   ?Write a sentence 1 1  1   ?Copy design 1 1 1   ?Total score 29 29 30   ? ? ?Neurological examination  ?Mentation: Alert oriented to time, place, history taking. Follows all commands speech and language fluent, well dressed and groomed ?C

## 2021-06-03 ENCOUNTER — Ambulatory Visit: Payer: Medicare HMO | Admitting: Podiatry

## 2021-06-08 ENCOUNTER — Ambulatory Visit: Payer: Medicare HMO | Admitting: Podiatry

## 2021-06-15 ENCOUNTER — Ambulatory Visit (INDEPENDENT_AMBULATORY_CARE_PROVIDER_SITE_OTHER): Payer: Medicare HMO | Admitting: Family Medicine

## 2021-06-15 ENCOUNTER — Encounter: Payer: Self-pay | Admitting: Family Medicine

## 2021-06-15 VITALS — BP 110/80 | HR 79 | Temp 98.2°F | Ht 63.0 in | Wt 183.4 lb

## 2021-06-15 DIAGNOSIS — R109 Unspecified abdominal pain: Secondary | ICD-10-CM | POA: Diagnosis not present

## 2021-06-15 DIAGNOSIS — R102 Pelvic and perineal pain: Secondary | ICD-10-CM

## 2021-06-15 LAB — POCT URINALYSIS DIP (MANUAL ENTRY)
Bilirubin, UA: NEGATIVE
Glucose, UA: NEGATIVE mg/dL
Leukocytes, UA: NEGATIVE
Nitrite, UA: NEGATIVE
Protein Ur, POC: NEGATIVE mg/dL
Spec Grav, UA: 1.025 (ref 1.010–1.025)
Urobilinogen, UA: 0.2 E.U./dL
pH, UA: 6 (ref 5.0–8.0)

## 2021-06-15 LAB — POCT UA - MICROSCOPIC ONLY: WBC, Ur, HPF, POC: NONE SEEN (ref 0–5)

## 2021-06-15 MED ORDER — ESTROGENS CONJUGATED 0.625 MG/GM VA CREA: 1.0000 | TOPICAL_CREAM | Freq: Every day | VAGINAL | 12 refills | Status: DC

## 2021-06-15 NOTE — Progress Notes (Signed)
? ? ?  SUBJECTIVE:  ? ?CHIEF COMPLAINT / HPI:  ? ?Abdominal discomfort: ?71 year old female presenting for the above.  She states it started about a month ago. It happens about twice per week. It happens in the lower stomach and lasts for a few minutes, maybe 3-4 minutes at max. No fevers or vomiting. Mostly happens when she urinates but only about twice per week. No burning with urination. She has bowels 3-4 times per week but never has to strain. ? ?Vaginal pain: ?Happens sometimes when urinating, 1-2x per week over the last month. She has not been sexually active in years. She denies discharge. States it improves in a few minutes. ? ?PERTINENT  PMH / PSH: None relevant ? ?OBJECTIVE:  ? ?BP 110/80   Pulse 79   Temp 98.2 ?F (36.8 ?C)   Ht 5\' 3"  (1.6 m)   Wt 183 lb 6.4 oz (83.2 kg)   SpO2 100%   BMI 32.49 kg/m?   ? ?General: NAD, pleasant, able to participate in exam ?Respiratory: No respiratory distress ?Abdomen: Bowel sounds present, abdomen soft, nontender, no suprapubic discomfort. ?Neuro: alert, no obvious focal deficits ?Psych: Normal affect and mood ? ?ASSESSMENT/PLAN:  ? ?Lower abdominal discomfort: ?Has been occurring a couple of times per week over the past month.  Seems to occur during or immediately after urination.  She denies any dysuria, urinary frequency.  No fevers or vomiting.  No vaginal bleeding or blood in stool.  She has bowel movement about every other day but states she does not have to strain.  Urinalysis performed shows trace blood, 0-2 per high-power field shown under microscope.  Differential can include dysuria due to urine on the surrounding tissues, constipation putting pressure on the bladder, or unrelated pain such as gas pains.  Low concern for malignancy with no unintentional weight loss, no blood in the stool and no vaginal bleeding.  Recommend trial of MiraLAX in order to have soft bowel movement daily.  I want to keep a journal of her symptoms and when they occur.  We will  going to follow-up in 1 month to see how her symptoms are improving. ? ?Vaginal pain: ?Also occurs 1 or 2 times per week over the past month.  She is not sexually active and has not been so in several years.  She does not have any vaginal discharge.  Discussed consideration for a wet prep to evaluate for BV or Candida and offered to perform this wet prep but she is less concerned about this.  Atrophic vaginitis is a possibility versus external dysuria as this seems to occur around urination.  Less concern for vaginal infection with no discharge, not being sexually active.  Can consider testing for this if it does not improve.  We will give trial of vaginal estrogen cream.  Recommend following up in 1 month or sooner if symptoms worsens. ? ? ? , DO ?[facility]  ? ? ? ?

## 2021-06-15 NOTE — Patient Instructions (Signed)
For your lower abdominal pain I would like you to try MiraLAX.  Start with 17 g which is 1 capful each day and you can increase this to 2 or 3 capfuls as needed to have a soft bowel movement each day.  I want you to keep a journal of when your abdominal pain happens, if it happens with urination or without urination, and any other symptoms that seem to happen around it.  I like see back in 4 weeks to discuss this if your symptoms have not improved or sooner if your symptoms worsen.  If your symptoms resolve we do not have to see you back for this. ? ?For the vaginal pain I am going to send in a vaginal estrogen cream for you to use to see if it improves the symptoms.  If it does improve the symptoms you do not have to see me back, if it does not improve the symptoms, if you start having vaginal bleeding, if you start having discharge or any other concerns please let me know.  We can always consider testing for yeast infection or bacterial overgrowth if you continue having symptoms. ?

## 2021-06-29 ENCOUNTER — Ambulatory Visit: Payer: Medicare HMO | Admitting: Podiatry

## 2021-06-29 DIAGNOSIS — M79674 Pain in right toe(s): Secondary | ICD-10-CM | POA: Diagnosis not present

## 2021-06-29 DIAGNOSIS — E119 Type 2 diabetes mellitus without complications: Secondary | ICD-10-CM

## 2021-06-29 DIAGNOSIS — M79675 Pain in left toe(s): Secondary | ICD-10-CM | POA: Diagnosis not present

## 2021-06-29 DIAGNOSIS — B351 Tinea unguium: Secondary | ICD-10-CM

## 2021-06-29 DIAGNOSIS — L84 Corns and callosities: Secondary | ICD-10-CM

## 2021-07-02 ENCOUNTER — Encounter: Payer: Self-pay | Admitting: Podiatry

## 2021-07-02 NOTE — Progress Notes (Signed)
?  Subjective:  ?Patient ID: Linda Arnold, female    DOB: August 20, 1950,  MRN: [id number] ? ?Chief Complaint  ?Patient presents with  ? Nail Problem  ?  Thick painful toenails, 3 month follow up   ? Callouses  ? ? ?71 y.o. female in for follow-up with the above complaint. History confirmed with patient.  No new issues since last visit.  The calluses are still giving her pain but she has been using urea cream which has helped quite a bit and softened the callus.  Nails are thickened elongated and painful again.  Intermittent debridement has been very helpful ? ?Objective:  ?Physical Exam: ?warm, good capillary refill, no trophic changes or ulcerative lesions, normal DP and PT pulses and normal sensory exam.  Onychomycosis x10 ?Left Foot: Hallux valgus with bunion present, semireducible hammertoes present ?Right Foot: Mild hallux valgus with bunion present, semireducible hammertoes present, tailor's bunion present, hyperkeratosis submet 2-3, submet 5, submet 5 is very painful ? ? ? ?Assessment:  ? ?1. Pain due to onychomycosis of toenails of both feet   ?2. Callus of foot   ?3. Diabetes mellitus type 2, noninsulin dependent (HCC)   ? ? ? ?Plan:  ?Patient was evaluated and treated and all questions answered. ? ?Patient educated on diabetes. Discussed proper diabetic foot care and discussed risks and complications of disease. Educated patient in depth on reasons to return to the office immediately should he/she discover anything concerning or new on the feet. All questions answered. Discussed proper shoes as well.  She would benefit from diabetic shoes due to her hyperkeratotic preulcerative lesions and foot deformities. ? ?All symptomatic hyperkeratoses were safely debrided with a sterile #15 blade to patient's level of comfort without incident. We discussed preventative and palliative care of these lesions including supportive and accommodative shoegear, padding, prefabricated and custom molded accommodative orthoses,  use of a pumice stone and lotions/creams daily. ? ?Discussed the etiology and treatment options for the condition in detail with the patient. Educated patient on the topical and oral treatment options for mycotic nails. Recommended debridement of the nails today. Sharp and mechanical debridement performed of all painful and mycotic nails today. Nails debrided in length and thickness using a nail nipper and a mechanical burr to level of comfort. Discussed treatment options including appropriate shoe gear. Follow up as needed for painful nails. ? ? ? ? ? ? ? ?Return in about 3 months (around 09/28/2021) for at risk diabetic foot care.  ? ?

## 2021-07-12 ENCOUNTER — Other Ambulatory Visit: Payer: Self-pay | Admitting: Family Medicine

## 2021-07-17 ENCOUNTER — Ambulatory Visit (HOSPITAL_COMMUNITY)
Admission: EM | Admit: 2021-07-17 | Discharge: 2021-07-17 | Disposition: A | Discharge status: Home / Self Care | Payer: Medicare HMO | Attending: Physician Assistant | Admitting: Physician Assistant

## 2021-07-17 ENCOUNTER — Other Ambulatory Visit: Payer: Self-pay

## 2021-07-17 ENCOUNTER — Ambulatory Visit (INDEPENDENT_AMBULATORY_CARE_PROVIDER_SITE_OTHER): Payer: Medicare HMO

## 2021-07-17 ENCOUNTER — Encounter (HOSPITAL_COMMUNITY): Payer: Self-pay | Admitting: *Deleted

## 2021-07-17 DIAGNOSIS — R519 Headache, unspecified: Secondary | ICD-10-CM

## 2021-07-17 DIAGNOSIS — R42 Dizziness and giddiness: Secondary | ICD-10-CM | POA: Diagnosis not present

## 2021-07-17 DIAGNOSIS — M542 Cervicalgia: Secondary | ICD-10-CM

## 2021-07-17 MED ORDER — IBUPROFEN 400 MG PO TABS: 400.0000 mg | ORAL_TABLET | Freq: Three times a day (TID) | ORAL | 0 refills | Status: DC

## 2021-07-17 MED ORDER — MECLIZINE HCL 12.5 MG PO TABS: 12.5000 mg | ORAL_TABLET | Freq: Three times a day (TID) | ORAL | 0 refills | Status: DC | PRN

## 2021-07-17 NOTE — Discharge Instructions (Addendum)
Advised to take low-dose Advil OTC as needed for pain relief. ?Advised to use ice to area 3-4 times daily 10 minutes for each session. ?Vies to follow-up with PCP for evaluation if needed. ?
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
## 2021-07-23 NOTE — Telephone Encounter (Signed)
Sent pcp the request

## 2021-07-26 ENCOUNTER — Other Ambulatory Visit: Payer: Self-pay

## 2021-07-26 MED ORDER — GLUCOSE BLOOD VI STRP: ORAL_STRIP | 12 refills | Status: DC

## 2021-07-26 MED ORDER — ACCU-CHEK AVIVA PLUS W/DEVICE KIT: 1.0000 | PACK | Freq: Four times a day (QID) | 1 refills | Status: DC | PRN

## 2021-07-26 MED ORDER — BD SWAB SINGLE USE REGULAR PADS: 1.0000 "application " | MEDICATED_PAD | Freq: Four times a day (QID) | 1 refills | Status: DC | PRN

## 2021-07-28 ENCOUNTER — Telehealth: Payer: Self-pay

## 2021-07-30 NOTE — Telephone Encounter (Signed)
Message sent to PCP. Quaneisha Hanisch, CMA  

## 2021-08-24 ENCOUNTER — Encounter: Payer: Self-pay | Admitting: Family Medicine

## 2021-08-24 ENCOUNTER — Ambulatory Visit (INDEPENDENT_AMBULATORY_CARE_PROVIDER_SITE_OTHER): Payer: Medicare HMO | Admitting: Family Medicine

## 2021-08-24 VITALS — BP 128/63 | HR 82 | Ht 63.0 in | Wt 179.0 lb

## 2021-08-24 DIAGNOSIS — E119 Type 2 diabetes mellitus without complications: Secondary | ICD-10-CM | POA: Diagnosis not present

## 2021-08-24 DIAGNOSIS — R350 Frequency of micturition: Secondary | ICD-10-CM

## 2021-08-24 LAB — POCT UA - MICROSCOPIC ONLY: WBC, Ur, HPF, POC: NONE SEEN (ref 0–5)

## 2021-08-24 LAB — POCT URINALYSIS DIP (MANUAL ENTRY)
Bilirubin, UA: NEGATIVE
Glucose, UA: NEGATIVE mg/dL
Ketones, POC UA: NEGATIVE mg/dL
Leukocytes, UA: NEGATIVE
Nitrite, UA: NEGATIVE
Protein Ur, POC: NEGATIVE mg/dL
Spec Grav, UA: <=1.005 — AB (ref 1.010–1.025)
Urobilinogen, UA: 0.2 E.U./dL
pH, UA: 6 (ref 5.0–8.0)

## 2021-08-24 LAB — POCT GLYCOSYLATED HEMOGLOBIN (HGB A1C): HbA1c, POC (controlled diabetic range): 6.1 % (ref 0.0–7.0)

## 2021-08-24 MED ORDER — METFORMIN HCL 500 MG PO TABS: 500.0000 mg | ORAL_TABLET | Freq: Every day | ORAL | 2 refills | Status: DC

## 2021-08-24 NOTE — Patient Instructions (Addendum)
Your A1c is great.  I recommend continuing the metformin at this time.  If it continues to be low at the next visit we could talk about decreasing the metformin.  As discussed you could go ahead and decrease it to once a day if you would like.  Your urine today does not show any signs of infection or sugar in the urine.  I recommend reducing the number of soft drinks per day to see if this improves her symptoms.

## 2021-08-24 NOTE — Progress Notes (Signed)
    SUBJECTIVE:   CHIEF COMPLAINT / HPI:   Frequent urination: 2 old female presenting with frequent urination and inquiring about urinary testing.  She states when she drinks a coke she notes she has increased urination but not as much when she drinks Sprite or unsweetened tea.  Denies dysuria, suprapubic pain.  Diabetic Follow Up: Patient is a 71 y.o. female who present today for diabetic follow up.   Patient endorses no problems  Home medications include: metformin 500mg  BID Patient endorses taking these medications as prescribed.  Most recent A1Cs:  Lab Results  Component Value Date   HGBA1C 6.1 08/24/2021   HGBA1C 5.9 02/12/2021   HGBA1C 6.3 (H) 07/23/2020   Last Microalbumin, LDL, Creatinine: Lab Results  Component Value Date   MICROALBUR 0.50 06/19/2008   LDLCALC 94 08/16/2018   CREATININE 0.83 02/12/2021    PERTINENT  PMH / PSH: None relevant  OBJECTIVE:   BP 128/63   Pulse 82   Ht 5\' 3"  (1.6 m)   Wt 179 lb (81.2 kg)   SpO2 100%   BMI 31.71 kg/m    General: NAD, pleasant, able to participate in exam Cardiac: RRR, no murmurs. Respiratory: CTAB, normal effort, No wheezes, rales or rhonchi Abdomen: Bowel sounds present, no suprapubic discomfort to palpation Psych: Normal affect and mood  ASSESSMENT/PLAN:    Frequent urination: UA today shows negative ketones, negative leukocytes, negative nitrite, negative glucose.  Patient endorses urinary frequency after coke and some after sprite but less after unsweetened tea. A1C of 6.1.  Discussed with her that it could be the artificial sweeteners in the diet soft drinks that she is drinking and she is going to trial reducing these to see if it improves her symptoms.  Diabetes: A1c excellent at 6.1.  Discussed reducing metformin to 500 mg daily.  Recommended following up in about 6 months given how well controlled it has been lately.  Recommended yearly diabetic eye exam.  We will reduce metformin to 500 mg daily.   If next A1c is well below goal may consider discontinuing this medicine.  14/11/2020, DO [facility]

## 2021-09-09 ENCOUNTER — Other Ambulatory Visit: Payer: Self-pay | Admitting: Family Medicine

## 2021-09-28 ENCOUNTER — Ambulatory Visit (INDEPENDENT_AMBULATORY_CARE_PROVIDER_SITE_OTHER): Payer: Medicare HMO | Admitting: Podiatry

## 2021-09-28 DIAGNOSIS — B351 Tinea unguium: Secondary | ICD-10-CM | POA: Diagnosis not present

## 2021-09-28 DIAGNOSIS — M79674 Pain in right toe(s): Secondary | ICD-10-CM

## 2021-09-28 DIAGNOSIS — M79675 Pain in left toe(s): Secondary | ICD-10-CM

## 2021-09-28 DIAGNOSIS — L84 Corns and callosities: Secondary | ICD-10-CM | POA: Diagnosis not present

## 2021-09-28 DIAGNOSIS — E119 Type 2 diabetes mellitus without complications: Secondary | ICD-10-CM | POA: Diagnosis not present

## 2021-09-29 NOTE — Progress Notes (Signed)
  Subjective:  Patient ID: Linda Arnold, female    DOB: 1950/04/29,  MRN: [id number]  Chief Complaint  Patient presents with   Nail Problem    Thick painful toenails, 3 month follow up    Diabetes   Callouses    5th submet right    71 y.o. female in for follow-up with the above complaint. History confirmed with patient.  No new issues since last visit.  She still using urea cream and calluses.  Nails are thickened elongated and painful again.  Intermittent debridement has been very helpful  Objective:  Physical Exam: warm, good capillary refill, no trophic changes or ulcerative lesions, normal DP and PT pulses and normal sensory exam.  Onychomycosis x10 with thickened elongated discolored nails Left Foot: Hallux valgus with bunion present, semireducible hammertoes present Right Foot: Mild hallux valgus with bunion present, semireducible hammertoes present, tailor's bunion present, hyperkeratosis submet 2-3, submet 5, submet 5 is very painful    Assessment:   1. Pain due to onychomycosis of toenails of both feet   2. Callus of foot   3. Diabetes mellitus type 2, noninsulin dependent (HCC)      Plan:  Patient was evaluated and treated and all questions answered.  Patient educated on diabetes. Discussed proper diabetic foot care and discussed risks and complications of disease. Educated patient in depth on reasons to return to the office immediately should he/she discover anything concerning or new on the feet. All questions answered. Discussed proper shoes as well.  She would benefit from diabetic shoes due to her hyperkeratotic preulcerative lesions and foot deformities.  All symptomatic hyperkeratoses were safely debrided with a sterile #15 blade to patient's level of comfort without incident. We discussed preventative and palliative care of these lesions including supportive and accommodative shoegear, padding, prefabricated and custom molded accommodative orthoses, use of a  pumice stone and lotions/creams daily.  Discussed the etiology and treatment options for the condition in detail with the patient. Educated patient on the topical and oral treatment options for mycotic nails. Recommended debridement of the nails today. Sharp and mechanical debridement performed of all painful and mycotic nails today. Nails debrided in length and thickness using a nail nipper and a mechanical burr to level of comfort. Discussed treatment options including appropriate shoe gear. Follow up as needed for painful nails.    Return in about 3 months (around 12/29/2021) for at risk diabetic foot care.

## 2021-11-19 ENCOUNTER — Other Ambulatory Visit: Payer: Self-pay | Admitting: Family Medicine

## 2021-11-25 ENCOUNTER — Ambulatory Visit (INDEPENDENT_AMBULATORY_CARE_PROVIDER_SITE_OTHER): Payer: Medicare HMO

## 2021-11-25 DIAGNOSIS — Z23 Encounter for immunization: Secondary | ICD-10-CM | POA: Diagnosis not present

## 2021-12-28 ENCOUNTER — Encounter: Payer: Self-pay | Admitting: Podiatry

## 2021-12-28 ENCOUNTER — Ambulatory Visit: Payer: Medicare HMO | Admitting: Podiatry

## 2021-12-28 DIAGNOSIS — M79675 Pain in left toe(s): Secondary | ICD-10-CM | POA: Diagnosis not present

## 2021-12-28 DIAGNOSIS — M21612 Bunion of left foot: Secondary | ICD-10-CM

## 2021-12-28 DIAGNOSIS — B351 Tinea unguium: Secondary | ICD-10-CM

## 2021-12-28 DIAGNOSIS — M2012 Hallux valgus (acquired), left foot: Secondary | ICD-10-CM | POA: Diagnosis not present

## 2021-12-28 DIAGNOSIS — L84 Corns and callosities: Secondary | ICD-10-CM

## 2021-12-28 DIAGNOSIS — M2042 Other hammer toe(s) (acquired), left foot: Secondary | ICD-10-CM | POA: Diagnosis not present

## 2021-12-28 DIAGNOSIS — M79674 Pain in right toe(s): Secondary | ICD-10-CM | POA: Diagnosis not present

## 2021-12-28 DIAGNOSIS — E119 Type 2 diabetes mellitus without complications: Secondary | ICD-10-CM | POA: Diagnosis not present

## 2021-12-29 ENCOUNTER — Encounter: Payer: Self-pay | Admitting: Podiatry

## 2021-12-29 NOTE — Progress Notes (Signed)
  Subjective:  Patient ID: Linda Arnold, female    DOB: 1950-12-27,  MRN: [id number]  Chief Complaint  Patient presents with   Debridement    Trim toenails/calluses   Diabetes    Last a1c was 6.1    71 y.o. female in for follow-up with the above complaint. History confirmed with patient.  No new issues since last visit.  She still using urea cream and calluses.  Nails are thickened elongated and painful again.  Intermittent debridement has been very helpful.  Her diabetes remains very well controlled  Objective:  Physical Exam: warm, good capillary refill, no trophic changes or ulcerative lesions, normal DP and PT pulses and normal sensory exam.  Onychomycosis x10 with thickened elongated discolored nails Left Foot: Hallux valgus with bunion present, semireducible hammertoes present Right Foot: Mild hallux valgus with bunion present, semireducible hammertoes present, tailor's bunion present, hyperkeratosis submet 2-3, submet 5, submet 5 is very painful    Assessment:   1. Pain due to onychomycosis of toenails of both feet   2. Callus of foot   3. Diabetes mellitus type 2, noninsulin dependent ([place])      Plan:  Patient was evaluated and treated and all questions answered.  Patient educated on diabetes. Discussed proper diabetic foot care and discussed risks and complications of disease. Educated patient in depth on reasons to return to the office immediately should he/she discover anything concerning or new on the feet. All questions answered. Discussed proper shoes as well.  Annual diabetic foot risk assessment was performed today.  Her sensation is largely intact and her foot deformities are well padded.  Routine debridement has been helpful in reducing risk of ulceration.  She will return to see me on a regular basis  All symptomatic hyperkeratoses were safely debrided with a sterile #15 blade to patient's level of comfort without incident. We discussed preventative and  palliative care of these lesions including supportive and accommodative shoegear, padding, prefabricated and custom molded accommodative orthoses, use of a pumice stone and lotions/creams daily.  Discussed the etiology and treatment options for the condition in detail with the patient. Educated patient on the topical and oral treatment options for mycotic nails. Recommended debridement of the nails today. Sharp and mechanical debridement performed of all painful and mycotic nails today. Nails debrided in length and thickness using a nail nipper and a mechanical burr to level of comfort. Discussed treatment options including appropriate shoe gear. Follow up as needed for painful nails.    Return in about 3 months (around 03/30/2022) for at risk diabetic foot care.

## 2022-01-21 ENCOUNTER — Other Ambulatory Visit: Payer: Self-pay | Admitting: Student

## 2022-02-01 ENCOUNTER — Other Ambulatory Visit: Payer: Self-pay | Admitting: Family Medicine

## 2022-02-01 DIAGNOSIS — Z1231 Encounter for screening mammogram for malignant neoplasm of breast: Secondary | ICD-10-CM

## 2022-03-14 DIAGNOSIS — Z135 Encounter for screening for eye and ear disorders: Secondary | ICD-10-CM | POA: Diagnosis not present

## 2022-03-14 DIAGNOSIS — H50111 Monocular exotropia, right eye: Secondary | ICD-10-CM | POA: Diagnosis not present

## 2022-03-14 DIAGNOSIS — H5203 Hypermetropia, bilateral: Secondary | ICD-10-CM | POA: Diagnosis not present

## 2022-03-14 DIAGNOSIS — E119 Type 2 diabetes mellitus without complications: Secondary | ICD-10-CM | POA: Diagnosis not present

## 2022-03-14 DIAGNOSIS — H2513 Age-related nuclear cataract, bilateral: Secondary | ICD-10-CM | POA: Diagnosis not present

## 2022-03-15 ENCOUNTER — Ambulatory Visit (INDEPENDENT_AMBULATORY_CARE_PROVIDER_SITE_OTHER): Payer: Medicare HMO | Admitting: Family Medicine

## 2022-03-15 ENCOUNTER — Other Ambulatory Visit: Payer: Self-pay | Admitting: Family Medicine

## 2022-03-15 ENCOUNTER — Encounter: Payer: Self-pay | Admitting: Family Medicine

## 2022-03-15 VITALS — BP 136/78 | HR 86 | Wt 178.0 lb

## 2022-03-15 DIAGNOSIS — Z23 Encounter for immunization: Secondary | ICD-10-CM

## 2022-03-15 DIAGNOSIS — E119 Type 2 diabetes mellitus without complications: Secondary | ICD-10-CM

## 2022-03-15 DIAGNOSIS — E7800 Pure hypercholesterolemia, unspecified: Secondary | ICD-10-CM

## 2022-03-15 DIAGNOSIS — R300 Dysuria: Secondary | ICD-10-CM | POA: Diagnosis not present

## 2022-03-15 DIAGNOSIS — Z0000 Encounter for general adult medical examination without abnormal findings: Secondary | ICD-10-CM | POA: Diagnosis not present

## 2022-03-15 DIAGNOSIS — I10 Essential (primary) hypertension: Secondary | ICD-10-CM

## 2022-03-15 HISTORY — DX: Dysuria: R30.0

## 2022-03-15 LAB — POCT URINALYSIS DIP (MANUAL ENTRY)
Bilirubin, UA: NEGATIVE
Glucose, UA: NEGATIVE mg/dL
Ketones, POC UA: NEGATIVE mg/dL
Leukocytes, UA: NEGATIVE
Nitrite, UA: NEGATIVE
Protein Ur, POC: NEGATIVE mg/dL
Spec Grav, UA: 1.01 (ref 1.010–1.025)
Urobilinogen, UA: 0.2 E.U./dL
pH, UA: 7 (ref 5.0–8.0)

## 2022-03-15 LAB — POCT GLYCOSYLATED HEMOGLOBIN (HGB A1C): HbA1c, POC (controlled diabetic range): 5.9 % (ref 0.0–7.0)

## 2022-03-15 LAB — POCT UA - MICROSCOPIC ONLY: WBC, Ur, HPF, POC: NONE SEEN (ref 0–5)

## 2022-03-15 NOTE — Assessment & Plan Note (Signed)
Intermittent dysuria but also with symptoms suggestive of overactive bladder.  Will need to rule out infection.  If urine culture is negative, we will plan to start on vibegron (beta 3- adrenergic receptor agonist).

## 2022-03-15 NOTE — Assessment & Plan Note (Addendum)
Initially hypertensive, improved on recheck.  Will not adjust medication at this time. BMP and microalbumin/creatinine ratio today.

## 2022-03-15 NOTE — Progress Notes (Signed)
    SUBJECTIVE:   CHIEF COMPLAINT / HPI:   Linda Arnold is a 72 y.o. female who presents to the [facility] clinic today to discuss the following concerns:   Diabetes, Type 2 - Last A1c was 6.1 in June - Medications: Metformin 500 mg daily - Compliance: Good - Checking BG at home: no - Diet: Tries to eat vegetables, 2-3 fruits a day. Trying to still make some improvements - Exercise: Walks 25-30 minutes daily - Eye exam: Office visit was 1/8. Reports she was told she had cataracts but no surgeries planned - Foot exam: Due - Microalbumin: Due. On Losartan 25 mg  - Statin: Pravastatin 40 mg  - Denies symptoms of hypoglycemia, polyuria, polydipsia, numbness extremities, foot ulcers/trauma  Urinary symptoms Onset: 1 week Dysuria: present Increased urinary frequency: No  Urinary urgency: Yes  Incontinence: Yes , chronic for her Abdominal pain: No  Hematuria: No  Nausea/vomiting: No  Flank pain:  right side pain, she thinks may be her arthritis  Fever: No   PERTINENT  PMH / PSH: HLD, mild cognitive impairment   OBJECTIVE:   BP 136/78   Pulse 86   Wt 178 lb (80.7 kg)   SpO2 98%   BMI 31.53 kg/m   Vitals:   03/15/22 1346 03/15/22 1423  BP: (!) 158/80 136/78  Pulse: 86   SpO2: 98%     General: NAD, pleasant, able to participate in exam Respiratory: Normal effort  Abdomen: Bowel sounds present, nontender, nondistended Back: No CVAT bilaterally  Extremities: no edema or cyanosis. Foot exam: No deformities, ulcerations, or other skin breakdown on feet bilaterally.  Sensation intact to monofilament and light touch.  PT and DP pulses intact BL.   Skin: warm and dry, no rashes noted Psych: Normal affect and mood  ASSESSMENT/PLAN:   Hypertension Initially hypertensive, improved on recheck.  Will not adjust medication at this time. BMP and microalbumin/creatinine ratio today.  Diabetes mellitus type 2, noninsulin dependent (HCC) A1c is at goal.  -F/u every 6  months -Continue ARB, statin -Urine microalbumin today -UTD eye exam -Foot exam today  Healthcare maintenance PNA vaccine today.  Will return for COVID vaccine next month.   Dysuria Intermittent dysuria but also with symptoms suggestive of overactive bladder.  Will need to rule out infection.  If urine culture is negative, we will plan to start on vibegron (beta 3- adrenergic receptor agonist).        Sharion Settler, [place]

## 2022-03-15 NOTE — Patient Instructions (Addendum)
It was wonderful to see you today.  Today we talked about:  I will let you know the results of your urine test. If you have an infection I will send in antibiotics for you. Otherwise, we can discuss a medication to help keep you from leaking so often.   We are doing lab work today to check your kidney function, electrolytes, and your cholesterol. I will send you a MyChart message if you have MyChart. Otherwise, I will give you a call for abnormal results or send a letter if everything returned back normal. If you don't hear from me in 2 weeks, please call the office.    Thank you for coming to your visit as scheduled. We have had a large "no-show" problem lately, and this significantly limits our ability to see and care for patients. As a friendly reminder- if you cannot make your appointment please call to cancel. We do have a no show policy for those who do not cancel within 24 hours. Our policy is that if you miss or fail to cancel an appointment within 24 hours, 3 times in a 68-month period, you may be dismissed from our clinic.   Thank you for choosing [facility].   Please call [phone] with any questions about today's appointment.  Please be sure to schedule follow up at the front  desk before you leave today.   Sharion Settler, DO PGY-3 Family Medicine

## 2022-03-15 NOTE — Assessment & Plan Note (Signed)
A1c is at goal.  -F/u every 6 months -Continue ARB, statin -Urine microalbumin today -UTD eye exam -Foot exam today

## 2022-03-15 NOTE — Assessment & Plan Note (Signed)
PNA vaccine today.  Will return for COVID vaccine next month.

## 2022-03-16 LAB — BASIC METABOLIC PANEL
BUN/Creatinine Ratio: 14 (ref 12–28)
BUN: 10 mg/dL (ref 8–27)
CO2: 23 mmol/L (ref 20–29)
Calcium: 9.3 mg/dL (ref 8.7–10.3)
Chloride: 100 mmol/L (ref 96–106)
Creatinine, Ser: 0.74 mg/dL (ref 0.57–1.00)
Glucose: 103 mg/dL — ABNORMAL HIGH (ref 70–99)
Potassium: 4.3 mmol/L (ref 3.5–5.2)
Sodium: 140 mmol/L (ref 134–144)
eGFR: 85 mL/min/{1.73_m2} (ref >59)

## 2022-03-16 LAB — LIPID PANEL
Chol/HDL Ratio: 2.4 ratio (ref 0.0–4.4)
Cholesterol, Total: 176 mg/dL (ref 100–199)
HDL: 74 mg/dL (ref >39)
LDL Chol Calc (NIH): 91 mg/dL (ref 0–99)
Triglycerides: 59 mg/dL (ref 0–149)
VLDL Cholesterol Cal: 11 mg/dL (ref 5–40)

## 2022-03-16 LAB — MICROALBUMIN / CREATININE URINE RATIO
Creatinine, Urine: 37.8 mg/dL
Microalb/Creat Ratio: 9 mg/g creat (ref 0–29)
Microalbumin, Urine: 3.3 ug/mL

## 2022-03-17 LAB — URINE CULTURE

## 2022-03-22 ENCOUNTER — Telehealth: Payer: Self-pay | Admitting: Family Medicine

## 2022-03-22 NOTE — Telephone Encounter (Signed)
Labs overall within normal limits. I would like to see her back to see how her urinary symptoms have been given her negative urine culture. Could we call her and switch her COVID vaccine appt in March to a full appointment where we can discuss her symptoms and get her vaccine at the same time?

## 2022-03-23 NOTE — Telephone Encounter (Signed)
Patient returns call to nurse line. Advised of results per Dr. Marcha Dutton. She reports that she is continuing to have burning with urination.   She denies back pain, abdominal pain, or fever at this time.   Urine culture negative on 03/15/22.  Patient requesting that message be sent to provider regarding next steps. She is agreeable to appointment if provider would like her to be seen for follow up.   Talbot Grumbling, RN

## 2022-03-24 NOTE — Telephone Encounter (Signed)
Will send in mirabegron now for symptoms based on assessment in Dr. Carolyn Stare note. Would like to see her back myself in 1 week to assess response. Can we get this set up?

## 2022-03-24 NOTE — Addendum Note (Signed)
Addended by: Jacelyn Grip B on: 03/24/2022 11:55 PM   Modules accepted: Orders

## 2022-03-25 ENCOUNTER — Other Ambulatory Visit: Payer: Self-pay | Admitting: Family Medicine

## 2022-03-25 DIAGNOSIS — N3281 Overactive bladder: Secondary | ICD-10-CM

## 2022-03-25 MED ORDER — MIRABEGRON ER 25 MG PO TB24: 25.0000 mg | ORAL_TABLET | Freq: Every day | ORAL | 0 refills | Status: DC

## 2022-03-29 ENCOUNTER — Ambulatory Visit
Admission: RE | Admit: 2022-03-29 | Discharge: 2022-03-29 | Disposition: A | Discharge status: Home / Self Care | Payer: Medicare HMO | Source: Ambulatory Visit | Attending: Family Medicine | Admitting: Family Medicine

## 2022-03-29 DIAGNOSIS — Z1231 Encounter for screening mammogram for malignant neoplasm of breast: Secondary | ICD-10-CM | POA: Diagnosis not present

## 2022-03-29 NOTE — Telephone Encounter (Signed)
Called patient and LVM asking her to return call to office to schedule follow up appointment.   Talbot Grumbling, RN

## 2022-03-30 ENCOUNTER — Ambulatory Visit (INDEPENDENT_AMBULATORY_CARE_PROVIDER_SITE_OTHER): Payer: Medicare HMO | Admitting: Family Medicine

## 2022-03-30 VITALS — BP 122/76 | HR 75 | Ht 63.0 in | Wt 176.8 lb

## 2022-03-30 DIAGNOSIS — E119 Type 2 diabetes mellitus without complications: Secondary | ICD-10-CM

## 2022-03-30 DIAGNOSIS — N3010 Interstitial cystitis (chronic) without hematuria: Secondary | ICD-10-CM

## 2022-03-30 DIAGNOSIS — K1379 Other lesions of oral mucosa: Secondary | ICD-10-CM

## 2022-03-30 HISTORY — DX: Interstitial cystitis (chronic) without hematuria: N30.10

## 2022-03-30 HISTORY — DX: Other lesions of oral mucosa: K13.79

## 2022-03-30 MED ORDER — PHENAZOPYRIDINE HCL 200 MG PO TABS: 200.0000 mg | ORAL_TABLET | Freq: Three times a day (TID) | ORAL | 0 refills | Status: DC | PRN

## 2022-03-30 NOTE — Assessment & Plan Note (Signed)
Patient with a history of intermittent burning with urination and discomfort.  May urinalysis but urine culture was -2 weeks prior with similar symptoms.  Patient was started on Myrbetriq for overactive bladder without any improvement in symptoms.  History at this point seems little bit more consistent with interstitial cystitis, we will go ahead and trial treatment with Pyridium to see if that improves symptoms. - Peritoneum 200 mg 3 times daily x 2 days, repeat for symptoms as necessary - Follow-up if no improvement - Discussed return precautions with symptom changes

## 2022-03-30 NOTE — Progress Notes (Signed)
    SUBJECTIVE:   CHIEF COMPLAINT / HPI:   Burning with urination - Intermittent, happens about 2 times per week - Burning sensation with urination and discomfort in her bladder region - Denies increased frequency, hematuria - Has hx of fibroids - Was started on Mybetriq without any improvement in symptoms - Has urinary urgency and wears depends in case it takes her too long to get to the bathroom  - Not taking anything over the counter  Growth on Palate - Patient reports hard growth in her mouth for a week or so - Initially was causing pain when eating - Now feels it has decreased in size and is feeling better - No smoking history   Hyperglycemia - Patient reports CBGs in the AM near 122 - She is concerned about being hyperglycemic   PERTINENT  PMH / PSH: Reviewed   OBJECTIVE:   BP 122/76   Pulse 75   Ht 5\' 3"  (1.6 m)   Wt 176 lb 12.8 oz (80.2 kg)   SpO2 100%   BMI 31.32 kg/m   General: NAD, well-appearing, well-nourished Respiratory: No respiratory distress, breathing comfortably, able to speak in full sentences Skin: warm and dry, no rashes noted on exposed skin Psych: Appropriate affect and mood Abdomen: soft, mild discomfort with suprapubic palpation HEENT: smooth raised nodule on hard palate without erythema or friability  ASSESSMENT/PLAN:   Interstitial cystitis Patient with a history of intermittent burning with urination and discomfort.  May urinalysis but urine culture was -2 weeks prior with similar symptoms.  Patient was started on Myrbetriq for overactive bladder without any improvement in symptoms.  History at this point seems little bit more consistent with interstitial cystitis, we will go ahead and trial treatment with Pyridium to see if that improves symptoms. - Peritoneum 200 mg 3 times daily x 2 days, repeat for symptoms as necessary - Follow-up if no improvement - Discussed return precautions with symptom changes  Lesion of hard palate Nodule  noted on hard palate of mouth, patient reports has had improvement in symptoms.  Unclear etiology of this lesion but do wonder if this is a fibroma.  Patient does not have any smoking history, but we want to closely monitor to ensure no concerns for cancer/precancerous lesion. - Follow-up in 1-2 weeks if no improvement - Consider ENT referral for biopsy if not resolving  Diabetes mellitus type 2, noninsulin dependent ([place]) Discussed with patient that last A1c was 2 weeks ago and was 5.9, which is in the pre-diabetic range. Encouraged patient to stop CBG checks unless she is symptomatic.      Linda Arnold, [place]

## 2022-03-30 NOTE — Assessment & Plan Note (Signed)
Nodule noted on hard palate of mouth, patient reports has had improvement in symptoms.  Unclear etiology of this lesion but do wonder if this is a fibroma.  Patient does not have any smoking history, but we want to closely monitor to ensure no concerns for cancer/precancerous lesion. - Follow-up in 1-2 weeks if no improvement - Consider ENT referral for biopsy if not resolving

## 2022-03-30 NOTE — Patient Instructions (Addendum)
You can stop taking the area Myrbetriq if it is not helping. You do not have any signs of infection so I do not think that we need to check your urine today.   I am sending a medication called Pyridium, this is a medication that you can take 3 times a day for 2 days in a row and see if that helps your symptoms.  If it does, you can just keep the extra pills on hand and use it if you start having symptoms again.  For your diabetes, your A1c is well-controlled and I do not think that you need to check your sugars at home unless you are feeling like your sugar is too low or too high (such as dizziness, shakiness, sweating profusely, significant fatigue).  For the spot in your mouth, not quite sure what it is but I think we can just keep an eye on it.  It is not getting better in the next 1 to 2 weeks or for starts getting worse or growing bigger let us kno  If that happens we would likely send you to the ear/nose/throat doctor to biopsy.

## 2022-03-30 NOTE — Assessment & Plan Note (Signed)
Discussed with patient that last A1c was 2 weeks ago and was 5.9, which is in the pre-diabetic range. Encouraged patient to stop CBG checks unless she is symptomatic.

## 2022-03-31 ENCOUNTER — Ambulatory Visit: Payer: Medicare HMO | Admitting: Podiatry

## 2022-03-31 DIAGNOSIS — L84 Corns and callosities: Secondary | ICD-10-CM | POA: Diagnosis not present

## 2022-03-31 DIAGNOSIS — M79674 Pain in right toe(s): Secondary | ICD-10-CM | POA: Diagnosis not present

## 2022-03-31 DIAGNOSIS — M79675 Pain in left toe(s): Secondary | ICD-10-CM

## 2022-03-31 DIAGNOSIS — B351 Tinea unguium: Secondary | ICD-10-CM | POA: Diagnosis not present

## 2022-03-31 DIAGNOSIS — E119 Type 2 diabetes mellitus without complications: Secondary | ICD-10-CM | POA: Diagnosis not present

## 2022-04-04 NOTE — Progress Notes (Signed)
  Subjective:  Patient ID: Linda Arnold, female    DOB: 1950/04/16,  MRN: [id number]  Chief Complaint  Patient presents with   Nail Problem    Thick painful toenails, 3 month follow up    72 y.o. female in for follow-up with the above complaint. History confirmed with patient.  No new issues since last visit.  She still using urea cream and calluses.  Nails are thickened elongated and painful again.  Intermittent debridement has been very helpful.  Her diabetes remains very well controlled  Objective:  Physical Exam: warm, good capillary refill, no trophic changes or ulcerative lesions, normal DP and PT pulses and normal sensory exam.  Onychomycosis x10 with thickened elongated discolored nails Left Foot: Hallux valgus with bunion present, semireducible hammertoes present Right Foot: Mild hallux valgus with bunion present, semireducible hammertoes present, tailor's bunion present, hyperkeratosis submet 2-3, submet 5, submet 5 is very painful    Assessment:   1. Pain due to onychomycosis of toenails of both feet   2. Callus of foot   3. Diabetes mellitus type 2, noninsulin dependent ([place])       Plan:  Patient was evaluated and treated and all questions answered.  Patient educated on diabetes. Discussed proper diabetic foot care and discussed risks and complications of disease. Educated patient in depth on reasons to return to the office immediately should he/she discover anything concerning or new on the feet. All questions answered. Discussed proper shoes as well.  Annual diabetic foot risk assessment was performed today.  Her sensation is largely intact and her foot deformities are well padded.  Routine debridement has been helpful in reducing risk of ulceration.  She will return to see me on a regular basis  All symptomatic hyperkeratoses were safely debrided with a sterile #15 blade to patient's level of comfort without incident. We discussed preventative and palliative care of  these lesions including supportive and accommodative shoegear, padding, prefabricated and custom molded accommodative orthoses, use of a pumice stone and lotions/creams daily.  Discussed the etiology and treatment options for the condition in detail with the patient. Educated patient on the topical and oral treatment options for mycotic nails. Recommended debridement of the nails today. Sharp and mechanical debridement performed of all painful and mycotic nails today. Nails debrided in length and thickness using a nail nipper and a mechanical burr to level of comfort. Discussed treatment options including appropriate shoe gear. Follow up as needed for painful nails.    Return in about 3 months (around 06/30/2022) for at risk diabetic foot care.

## 2022-04-15 ENCOUNTER — Ambulatory Visit: Payer: Self-pay | Admitting: Family Medicine

## 2022-04-19 ENCOUNTER — Other Ambulatory Visit: Payer: Self-pay

## 2022-04-19 MED ORDER — PRAVASTATIN SODIUM 40 MG PO TABS: 40.0000 mg | ORAL_TABLET | Freq: Every day | ORAL | 2 refills | Status: DC

## 2022-05-06 ENCOUNTER — Ambulatory Visit: Payer: Self-pay

## 2022-05-24 ENCOUNTER — Ambulatory Visit: Payer: Medicare HMO | Admitting: Neurology

## 2022-05-24 ENCOUNTER — Encounter: Payer: Self-pay | Admitting: Neurology

## 2022-05-24 VITALS — BP 130/84 | HR 71 | Ht 63.0 in | Wt 179.0 lb

## 2022-05-24 DIAGNOSIS — G3184 Mild cognitive impairment, so stated: Secondary | ICD-10-CM

## 2022-05-24 NOTE — Patient Instructions (Signed)
Referral for neuropsych evaluation to better characterize memory troubles

## 2022-05-24 NOTE — Progress Notes (Signed)
PATIENT: Linda Arnold DOB: 1951-01-29  REASON FOR VISIT: follow up for memory HISTORY FROM: patient PRIMARY NEUROLOGIST: Dr. Krista Blue   HISTORY  Linda Arnold is a 72 year old female, seen in request by her primary care physician Dr. Criss Rosales, Myra Rude for evaluation of memory loss, initial evaluation was on September 18, 2018.   I have reviewed and summarized the referring note from the referring physician.  She had a past medical history of diabetes, hyperlipidemia, she used to work at [employer] job, did daycare work for about 3 years, she lives alone, still drives, around [id number], she noticed gradual onset memory loss, initially forget people's name, telephone number, gradually getting worse, she forgot what she would have for breath first quickly   She was admitted to the hospital on August 14, 2018, while her sister talked with her, she was noted to repeating questions, confused, her sister came over to check on her, realize she was very confused, did not know what was going on, her sister called EMS, she was admitted to the hospital, she cannot remember the ride to hospital, woke up few hours later realized that she is in hospital.   I personally reviewed the brain on August 15, 2018, punctuated acute infarction at the right precentral gyri, otherwise there was generalized atrophy, especially at the left perisylvian fissure, CT angiogram of neck and brain showed no large vessel disease.   Laboratory evaluations showed normal lipid profile, A1c was 6.1, BMP showed calcium 8.4, normal CBC, Echocardiogram on August 15 2018, showed ejection fraction 60 to 65%,  UPDATE May 20 2019: She is overall doing very well, there is no longer confusional episode, she continue complains of intermittent mild short-term memory loss, she gave me the example, while talking with her friend, she could not think of what breakfast she had, but she can recall it few minutes later.  EEG was normal in July 2020,  Update  December 20, 2018 SS: She had a normal EEG in July 2020.  She has not had any further episodes of amnesia or prolonged memory loss.  She indicates she continues to report trouble with her short-term memory. Specifically, she records her daily food intake, often forgets what she ate for meals.  She lives alone, does her own ADLs, pays her bills, and drives a car without difficulty.  She walks 30 minutes daily.  Within the last month she was diagnosed with obstructive sleep apnea, has now started CPAP.  She presents today for follow-up unaccompanied.  She does report that she has noticed memory loss for the last 3 years. No family history of memory loss.   Update 05/20/2019 Dr. Krista Blue: TSH, RPR, Vitamin B12 were normal. I personally reviewed MRI of the brain in June 2020, mild supratentorium small vessel disease there was no acute abnormality.  She has obstructive sleep apnea, faithfully using her CPAP machine every night, exercise regularly, drink at least 6 cups of water daily.  She drives to clinic today by memory, is able to handle her household bills without any difficulty.  Update May 20, 2020 SS: Here today alone, feels short term memory is slipping. Lives alone, managing her household, finances, driving well. Ex: takes awhile to remember what she had for meals, comes to her later.. Hasn't given up anything because memory. No friends or family mention memory concerns for her. Using CPAP. Remains on aspirin 81 mg daily, exercise with walking. Sees PCP. MMSE 29/30 today.  Update May 20, 2021 SS: here today  alone, lives alone, drives a car, manages her household alone well, 29/30 MMSE today. Still trouble with short term memory. No health issues. Walks 30 minutes everyday. Has extended family, doesn't have relationship with her son. Stays active with social activity, looks great today.   Update May 24, 2022 SS: MMSE 29/30. Still trouble with short term memory. Her family or friends don't have any  concerns. Lives alone, manages her own affairs. Remains on aspirin 81 mg daily. Basically trouble is with name recall, it will eventually come to her. Looks great today.  REVIEW OF SYSTEMS: Out of a complete 14 system review of symptoms, the patient complains only of the following symptoms, and all other reviewed systems are negative.  See HPI  ALLERGIES: Allergies  Allergen Reactions   Betadine [Povidone Iodine] Rash   Lisinopril Rash    HOME MEDICATIONS: Outpatient Medications Prior to Visit  Medication Sig Dispense Refill   Alcohol Swabs (DROPSAFE ALCOHOL PREP) 70 % PADS USE FOUR TIMES DAILY AS NEEDED 300 each 10   ASPIRIN 81 PO Take 81 mg by mouth daily.     Blood Glucose Monitoring Suppl (ACCU-CHEK GUIDE) w/Device KIT USE AS DIRECTED 1 kit 1   Calcium Citrate (CITRACAL PO) Take 1 tablet by mouth daily.     glucose blood test strip Use as instructed 100 each 12   losartan (COZAAR) 25 MG tablet TAKE 1 TABLET EVERY DAY 90 tablet 2   metFORMIN (GLUCOPHAGE) 500 MG tablet Take 1 tablet (500 mg total) by mouth daily with breakfast. 90 tablet 2   pravastatin (PRAVACHOL) 40 MG tablet Take 1 tablet (40 mg total) by mouth daily. 90 tablet 2   conjugated estrogens (PREMARIN) vaginal cream Place 1 Applicatorful vaginally daily. (Patient not taking: Reported on 03/15/2022) 42.5 g 12   ibuprofen (ADVIL) 400 MG tablet Take 1 tablet (400 mg total) by mouth 3 (three) times daily. (Patient not taking: Reported on 03/15/2022) 12 tablet 0   meclizine (ANTIVERT) 12.5 MG tablet Take 1 tablet (12.5 mg total) by mouth 3 (three) times daily as needed for dizziness. (Patient not taking: Reported on 03/15/2022) 12 tablet 0   mirabegron ER (MYRBETRIQ) 25 MG TB24 tablet Take 1 tablet (25 mg total) by mouth daily. 30 tablet 0   phenazopyridine (PYRIDIUM) 200 MG tablet Take 1 tablet (200 mg total) by mouth 3 (three) times daily as needed for pain. 20 tablet 0   No facility-administered medications prior to visit.     PAST MEDICAL HISTORY: Past Medical History:  Diagnosis Date   Arthritis 1999   knees   Diabetes mellitus without complication ([place]) [id number]   Dysphagia    Hyperlipidemia    Hypertension    Memory loss     PAST SURGICAL HISTORY: Past Surgical History:  Procedure Laterality Date   CESAREAN SECTION     TUBAL LIGATION      FAMILY HISTORY: Family History  Problem Relation Age of Onset   Diabetes Mother        died at age 12   Congestive Heart Failure Mother    Diabetes Sister    Hyperlipidemia Sister    Hypertension Sister    Cancer Brother 71       Lung cancer x2 brothers   Hypertension Brother    Other Father        died in [place] from not being able to "pass his urine"    SOCIAL HISTORY: Social History   Socioeconomic History   Marital status: Widowed  Spouse name: Not on file   Number of children: 1   Years of education: 14   Highest education level: Associate degree: academic program  Occupational History   Occupation: retired  Tobacco Use   Smoking status: Never    Passive exposure: Never   Smokeless tobacco: Never  Vaping Use   Vaping Use: Never used  Substance and Sexual Activity   Alcohol use: Never   Drug use: No   Sexual activity: Not Currently  Other Topics Concern   Not on file  Social History Narrative   Patient lives alone in [place].    Patient is a widower and has one son located in [place].    Patient walks daily for exercise.    Patient enjoys reading, shopping, friends, and walking.    Social Determinants of Health   Financial Resource Strain: Low Risk  (11/04/2020)   Overall Financial Resource Strain (CARDIA)    Difficulty of Paying Living Expenses: Not hard at all  Food Insecurity: No Food Insecurity (11/04/2020)   Hunger Vital Sign    Worried About Running Out of Food in the Last Year: Never true    Ran Out of Food in the Last Year: Never true  Transportation Needs: No Transportation Needs (11/04/2020)   PRAPARE -  Hydrologist (Medical): No    Lack of Transportation (Non-Medical): No  Physical Activity: Sufficiently Active (11/04/2020)   Exercise Vital Sign    Days of Exercise per Week: 7 days    Minutes of Exercise per Session: 30 min  Stress: No Stress Concern Present (11/04/2020)   [place]    Feeling of Stress : Only a little  Social Connections: Moderately Isolated (11/04/2020)   Social Connection and Isolation Panel [NHANES]    Frequency of Communication with Friends and Family: More than three times a week    Frequency of Social Gatherings with Friends and Family: More than three times a week    Attends Religious Services: More than 4 times per year    Active Member of [employer] or Organizations: No    Attends Archivist Meetings: Never    Marital Status: Widowed  Intimate Partner Violence: Not At Risk (11/04/2020)   Humiliation, Afraid, Rape, and Kick questionnaire    Fear of Current or Ex-Partner: No    Emotionally Abused: No    Physically Abused: No    Sexually Abused: No   PHYSICAL EXAM  Vitals:   05/24/22 1415  BP: 130/84  Pulse: 71  Weight: 179 lb (81.2 kg)  Height: 5\' 3"  (1.6 m)     Body mass index is 31.71 kg/m.  Generalized: Well developed, in no acute distress, well-appearing    05/24/2022    2:16 PM 05/20/2021   12:47 PM 05/20/2020   12:41 PM  MMSE - Mini Mental State Exam  Orientation to time 5 5 5   Orientation to Place 5 5 5   Registration 3 3 3   Attention/ Calculation 4 4 5   Recall 3 3 2   Language- name 2 objects 2 2 2   Language- repeat 1 1 1   Language- follow 3 step command 3 3 3   Language- read & follow direction 1 1 1   Write a sentence 1 1 1   Copy design 1 1 1   Total score 29 29 29     Neurological examination  Mentation: Alert oriented to time, place, history taking. Follows all commands speech and language fluent, well  dressed and groomed Cranial  nerve II-XII: Pupils were equal round reactive to light, right exotropia,  Extraocular movements were full, visual field were full on confrontational test. Facial sensation and strength were normal. Head turning and shoulder shrug  were normal and symmetric. Motor: The motor testing reveals 5 over 5 strength of all 4 extremities. Good symmetric motor tone is noted throughout.  Sensory: Sensory testing is intact to soft touch on all 4 extremities. No evidence of extinction is noted.  Coordination: Cerebellar testing reveals good finger-nose-finger and heel-to-shin bilaterally.  Gait and station: Gait is normal. Reflexes: Deep tendon reflexes are symmetric and normal bilaterally.   DIAGNOSTIC DATA (LABS, IMAGING, TESTING) - I reviewed patient records, labs, notes, testing and imaging myself where available.  Lab Results  Component Value Date   WBC 6.9 08/16/2018   HGB 12.1 08/16/2018   HCT 36.8 08/16/2018   MCV 92.0 08/16/2018   PLT 173 08/16/2018      Component Value Date/Time   NA 140 03/15/2022 1452   K 4.3 03/15/2022 1452   CL 100 03/15/2022 1452   CO2 23 03/15/2022 1452   GLUCOSE 103 (H) 03/15/2022 1452   GLUCOSE 92 08/16/2018 0319   BUN 10 03/15/2022 1452   CREATININE 0.74 03/15/2022 1452   CREATININE 0.76 02/18/2013 0830   CALCIUM 9.3 03/15/2022 1452   PROT 6.8 07/23/2020 1611   ALBUMIN 4.5 07/23/2020 1611   AST 23 07/23/2020 1611   ALT 25 07/23/2020 1611   ALKPHOS 87 07/23/2020 1611   BILITOT 0.3 07/23/2020 1611   GFRNONAA >60 08/16/2018 0319   GFRAA >60 08/16/2018 0319   Lab Results  Component Value Date   CHOL 176 03/15/2022   HDL 74 03/15/2022   LDLCALC 91 03/15/2022   LDLDIRECT 112 (H) 10/29/2012   TRIG 59 03/15/2022   CHOLHDL 2.4 03/15/2022   Lab Results  Component Value Date   HGBA1C 5.9 03/15/2022   Lab Results  Component Value Date   VITAMINB12 522 09/18/2018   Lab Results  Component Value Date   TSH 2.840 09/18/2018   ASSESSMENT AND PLAN 72  y.o. year old female   1.  Mild cognitive impairment 2.  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

## 2022-05-25 ENCOUNTER — Telehealth: Payer: Self-pay | Admitting: Neurology

## 2022-05-25 NOTE — Telephone Encounter (Signed)
Referral for neuropsychology fax to [facility]. Phone: [phone], Fax: [phone]. 

## 2022-05-30 ENCOUNTER — Other Ambulatory Visit: Payer: Self-pay | Admitting: Family Medicine

## 2022-06-16 ENCOUNTER — Encounter: Payer: Self-pay | Admitting: Family Medicine

## 2022-06-16 ENCOUNTER — Ambulatory Visit (INDEPENDENT_AMBULATORY_CARE_PROVIDER_SITE_OTHER): Payer: Medicare HMO | Admitting: Family Medicine

## 2022-06-16 VITALS — BP 128/80 | HR 79 | Ht 63.0 in | Wt 181.2 lb

## 2022-06-16 DIAGNOSIS — M546 Pain in thoracic spine: Secondary | ICD-10-CM | POA: Diagnosis not present

## 2022-06-16 DIAGNOSIS — R3915 Urgency of urination: Secondary | ICD-10-CM | POA: Diagnosis not present

## 2022-06-16 MED ORDER — MIRABEGRON ER 25 MG PO TB24: 25.0000 mg | ORAL_TABLET | Freq: Every day | ORAL | 0 refills | Status: DC

## 2022-06-16 NOTE — Patient Instructions (Signed)
It was great to see you today! Here's what we talked about:  We are going to get x-rays of your lumbar and thoracic spine to further look at this pain. In the meantime, you can continue to take tylenol and use heating pads on the area. I will follow up the results of your imaging with you.  Please let me know if you have any other questions.  Dr. Phineas Real

## 2022-06-16 NOTE — Assessment & Plan Note (Signed)
Differential includes muscle strain (though lack of point tenderness and waxing and waning symptoms may make this less likely), intra-abdominal process such as pancreatitis or cholecystitis (though abdominal exam benign), compression fracture (her sex and age make this more likely), degenerative disc disease or disc herniation (though no radicular symptoms appreciated), shingles (though no rash noted), PE/ACS (waxing and waning pain in setting of stable vitals makes this less likely), and metastatic bone disease (her age and atypical presentation of pain could make this more likely).  Will obtain lumbar and thoracic x-rays to further evaluate and follow-up results.  In the meantime, discussed Tylenol and heating pad for pain relief.

## 2022-06-16 NOTE — Progress Notes (Signed)
    SUBJECTIVE:   CHIEF COMPLAINT / HPI:   Back and side pain Has been going on for a month and a half.  Whenever it first started, the pain was in the middle of her back and radiated on both sides to the front right beneath her breasts.  It was so bad she could not hardly move.  She took some Tylenol and went to sleep, and it resolved.  After about 3 weeks, another episode exactly like this happened.  Again, she did Tylenol and sleep, and it resolved.  However, since then, she has had persistent low-level pain in the area that radiates bilaterally to her front.  She has not had any new trauma.  No fevers.  No new rashes.  No new increase in activity.  Urinary symptoms Patient has history of urinary symptoms with dysuria, hematuria, increased frequency.  She now only has urinary urgency as get to the bathroom really quickly or she will urinate on herself.  She no longer is having any urinary discomfort.  She notes she was prescribed a medicine in January, though she never had it filled.  She would like to get a repeat prescription for this and try it.  PERTINENT  PMH / PSH: T2DM, HLD, mild cognitive impairment, interstitial cystitis  OBJECTIVE:   BP 128/80   Pulse 79   Ht 5\' 3"  (1.6 m)   Wt 181 lb 3.2 oz (82.2 kg)   SpO2 96%   BMI 32.10 kg/m   General: Alert and oriented, in NAD Skin: Warm, dry, and intact without lesions HEENT: NCAT, EOM grossly normal with disconjugate gaze, midline nasal septum Cardiac: RRR, no m/r/g appreciated Respiratory/Chest: CTAB, breathing and speaking comfortably on RA, no tenderness to palpation over thoracic or lumbar spinous processes or paraspinal muscles Abdominal: Soft, nontender, nondistended, normoactive bowel sounds Extremities: Moves all extremities grossly equally Neurological: No gross focal deficit, ambulates well Psychiatric: Appropriate mood and affect   ASSESSMENT/PLAN:   Acute midline thoracic back pain Differential includes muscle  strain (though lack of point tenderness and waxing and waning symptoms may make this less likely), intra-abdominal process such as pancreatitis or cholecystitis (though abdominal exam benign), compression fracture (her sex and age make this more likely), degenerative disc disease or disc herniation (though no radicular symptoms appreciated), shingles (though no rash noted), PE/ACS (waxing and waning pain in setting of stable vitals makes this less likely), and metastatic bone disease (her age and atypical presentation of pain could make this more likely).  Will obtain lumbar and thoracic x-rays to further evaluate and follow-up results.  In the meantime, discussed Tylenol and heating pad for pain relief.   Urinary symptoms Previously thought to be due to overactive bladder versus interstitial cystitis.  Patient's continued symptoms of urgency without discomfort most suggestive of overactive bladder at this time.  Her sex and age support this diagnosis as well.  The previous notes indicate she has used mirabegron, though she does not believe she has, so we will refill at 25 mg daily and follow-up at next visit for symptom relief.  Health maintenance MAW referred. Will discuss vaccines at next visit. Recommend obtaining updated Hgb A1c at that time, as well.  Janeal Holmes, MD [facility]

## 2022-06-17 ENCOUNTER — Ambulatory Visit
Admission: RE | Admit: 2022-06-17 | Discharge: 2022-06-17 | Disposition: A | Discharge status: Home / Self Care | Payer: Medicare HMO | Source: Ambulatory Visit | Attending: Family Medicine | Admitting: Family Medicine

## 2022-06-17 DIAGNOSIS — M545 Low back pain, unspecified: Secondary | ICD-10-CM | POA: Diagnosis not present

## 2022-06-17 DIAGNOSIS — M549 Dorsalgia, unspecified: Secondary | ICD-10-CM | POA: Diagnosis not present

## 2022-06-17 DIAGNOSIS — M546 Pain in thoracic spine: Secondary | ICD-10-CM

## 2022-06-21 ENCOUNTER — Telehealth: Payer: Self-pay | Admitting: Family Medicine

## 2022-06-21 ENCOUNTER — Telehealth: Payer: Self-pay

## 2022-06-21 DIAGNOSIS — G4730 Sleep apnea, unspecified: Secondary | ICD-10-CM

## 2022-06-21 MED ORDER — LIDOCAINE 5 % EX PTCH: 1.0000 | MEDICATED_PATCH | CUTANEOUS | 0 refills | Status: AC

## 2022-06-21 NOTE — Telephone Encounter (Signed)
Attempted to call patient to discuss XR results. Thoracic and lumbar spine both showed degenerative disc disease which can be a normal part of aging. I believe the degenerated disk in her thoracic spine is causing nerve impingement and causing her characteristic dermatomal pain around her abdomen. The only potential curative would be surgery, but symptoms can be relieved with tylenol and ibuprofen. Please advise patient of these results and let me know should she want to chat more before her next appointment.

## 2022-06-21 NOTE — Telephone Encounter (Signed)
Placed order for CPAP supplies.   Also spoke with patient about XR results. Will send in lidocaine patch to be used with tylenol and ibuprofen prn. Less likely to start on TCAs or gabapentin due to age. Discussed PT in the future if this is not helpful. Could also consider neurosurgery referral if all conservative measures fail.

## 2022-06-21 NOTE — Telephone Encounter (Signed)
Patient calls nurse line regarding CPAP supplies. Patient was previously receiving supplies through [facility]. Insurance no longer covers through [place] and needs to have new orders sent to [facility].   She is requesting the following: Nasal cushion gel flaps Nasal appliance device Tubing, filters and water chambers CPAP [employer]  Once orders are placed, please route back to RN team for processing.   Veronda Prude, RN

## 2022-06-21 NOTE — Telephone Encounter (Signed)
Patient returns call to nurse line. Advised of message per Dr. Phineas Real.   She is asking if there are any stronger medications she could be prescribed as she continues to have significant pain.   Please return call to patient at [phone].  Veronda Prude, RN

## 2022-06-21 NOTE — Addendum Note (Signed)
Addended by: Evette Georges B on: 06/21/2022 06:20 PM   Modules accepted: Orders

## 2022-06-22 NOTE — Telephone Encounter (Signed)
Community message sent to [facility]. Will await response.   Artice Bergerson C Jabin Tapp, RN  

## 2022-06-23 NOTE — Telephone Encounter (Signed)
Receipt confirmed by [facility].   Avanni Turnbaugh C Leith Szafranski, RN  

## 2022-06-30 ENCOUNTER — Ambulatory Visit: Payer: Medicare HMO | Admitting: Podiatry

## 2022-07-06 ENCOUNTER — Ambulatory Visit: Payer: Medicare HMO | Admitting: Podiatry

## 2022-07-06 DIAGNOSIS — B351 Tinea unguium: Secondary | ICD-10-CM | POA: Diagnosis not present

## 2022-07-06 DIAGNOSIS — E119 Type 2 diabetes mellitus without complications: Secondary | ICD-10-CM

## 2022-07-06 DIAGNOSIS — L84 Corns and callosities: Secondary | ICD-10-CM

## 2022-07-06 DIAGNOSIS — M79675 Pain in left toe(s): Secondary | ICD-10-CM | POA: Diagnosis not present

## 2022-07-06 DIAGNOSIS — M79674 Pain in right toe(s): Secondary | ICD-10-CM | POA: Diagnosis not present

## 2022-07-07 NOTE — Progress Notes (Signed)
  Subjective:  Patient ID: Linda Arnold, female    DOB: 05/13/1950,  MRN: [id number]  Chief Complaint  Patient presents with   Nail Problem    Thick painful toenails, 3 month follow up    71 y.o. female in for follow-up with the above complaint. History confirmed with patient.  No new issues since last visit.  She still using urea cream and calluses.  Nails are thickened elongated and painful again.  Intermittent debridement has been very helpful.  Her diabetes remains very well controlled  Objective:  Physical Exam: warm, good capillary refill, no trophic changes or ulcerative lesions, normal DP and PT pulses and normal sensory exam.  Onychomycosis x10 with thickened elongated discolored nails Left Foot: Hallux valgus with bunion present, semireducible hammertoes present Right Foot: Mild hallux valgus with bunion present, semireducible hammertoes present, tailor's bunion present, hyperkeratosis submet 2-3, submet 5, submet 5 is very painful    Assessment:   1. Pain due to onychomycosis of toenails of both feet   2. Callus of foot   3. Diabetes mellitus type 2, noninsulin dependent (HCC)       Plan:  Patient was evaluated and treated and all questions answered.  Patient educated on diabetes. Discussed proper diabetic foot care and discussed risks and complications of disease. Educated patient in depth on reasons to return to the office immediately should he/she discover anything concerning or new on the feet. All questions answered. Discussed proper shoes as well.  Annual diabetic foot risk assessment was performed today.  Her sensation is largely intact and her foot deformities are well padded.  Routine debridement has been helpful in reducing risk of ulceration.  She will return to see me on a regular basis  All symptomatic hyperkeratoses were safely debrided with a sterile #15 blade to patient's level of comfort without incident. We discussed preventative and palliative care of  these lesions including supportive and accommodative shoegear, padding, prefabricated and custom molded accommodative orthoses, use of a pumice stone and lotions/creams daily.  Discussed the etiology and treatment options for the condition in detail with the patient. Educated patient on the topical and oral treatment options for mycotic nails. Recommended debridement of the nails today. Sharp and mechanical debridement performed of all painful and mycotic nails today. Nails debrided in length and thickness using a nail nipper and a mechanical burr to level of comfort. Discussed treatment options including appropriate shoe gear. Follow up as needed for painful nails.    Return in about 3 months (around 06/30/2022) for at risk diabetic foot care.  

## 2022-07-14 ENCOUNTER — Other Ambulatory Visit: Payer: Self-pay | Admitting: *Deleted

## 2022-07-14 MED ORDER — MIRABEGRON ER 25 MG PO TB24: 25.0000 mg | ORAL_TABLET | Freq: Every day | ORAL | 0 refills | Status: AC

## 2022-07-26 ENCOUNTER — Ambulatory Visit (INDEPENDENT_AMBULATORY_CARE_PROVIDER_SITE_OTHER): Payer: Medicare HMO | Admitting: Student

## 2022-07-26 ENCOUNTER — Encounter: Payer: Self-pay | Admitting: Student

## 2022-07-26 VITALS — BP 128/74 | HR 75 | Ht 63.0 in | Wt 182.4 lb

## 2022-07-26 DIAGNOSIS — M25562 Pain in left knee: Secondary | ICD-10-CM | POA: Diagnosis not present

## 2022-07-26 DIAGNOSIS — M25462 Effusion, left knee: Secondary | ICD-10-CM

## 2022-07-26 DIAGNOSIS — M1712 Unilateral primary osteoarthritis, left knee: Secondary | ICD-10-CM | POA: Insufficient documentation

## 2022-07-26 NOTE — Patient Instructions (Signed)
It was great to see you today! Thank you for choosing [facility] for your primary care. Linda Arnold was seen for swelling of left knee.  Today we addressed: I am checking a lab that if it is elevated we may get imaging to investigate for a clot.  If you haven't already, sign up for My Chart to have easy access to your labs results, and communication with your primary care physician.  We are checking some labs today. If they are abnormal, I will call you. If they are normal, I will send you a MyChart message (if it is active) or a letter in the mail. If you do not hear about your labs in the next 2 weeks, please call the office.  You should return to our clinic Return if symptoms worsen or fail to improve. Please arrive 15 minutes before your appointment to ensure smooth check in process.  We appreciate your efforts in making this happen.  Thank you for allowing me to participate in your care, Shelby Mattocks, DO 07/26/2022, 3:43 PM PGY-2, [facility]

## 2022-07-26 NOTE — Progress Notes (Unsigned)
  SUBJECTIVE:   CHIEF COMPLAINT / HPI:   Left Thursday, posterior left knee started swelling. Denies trauma. Taking Tylenol, swelling has not improved but remained the same. Pain 5-6/10 in the back of her knee. Never had a blood clot. Not traveled recently.   PERTINENT  PMH / PSH: T2DM, sleep apnea, HTN, HLD  Patient Care Team: Evette Georges, MD as PCP - General (Family Medicine) Lilian Kapur Rachelle Hora, DPM as Consulting Physician (Podiatry) Glean Salvo, NP as Nurse Practitioner (Neurology) OBJECTIVE:  There were no vitals taken for this visit. Physical Exam   ASSESSMENT/PLAN:  There are no diagnoses linked to this encounter. No follow-ups on file. Shelby Mattocks, DO 07/26/2022, 2:13 PM PGY-2, [facility] {    This will disappear when note is signed, click to select method of visit    :1}

## 2022-07-27 ENCOUNTER — Telehealth: Payer: Self-pay | Admitting: *Deleted

## 2022-07-27 ENCOUNTER — Other Ambulatory Visit: Payer: Self-pay | Admitting: *Deleted

## 2022-07-27 ENCOUNTER — Other Ambulatory Visit: Payer: Medicare HMO

## 2022-07-27 ENCOUNTER — Other Ambulatory Visit: Payer: Self-pay | Admitting: Student

## 2022-07-27 DIAGNOSIS — M25462 Effusion, left knee: Secondary | ICD-10-CM

## 2022-07-27 DIAGNOSIS — M25562 Pain in left knee: Secondary | ICD-10-CM | POA: Diagnosis not present

## 2022-07-27 LAB — SPECIMEN STATUS

## 2022-07-27 LAB — D-DIMER, QUANTITATIVE: D-DIMER: 0.62 mg/L FEU — ABNORMAL HIGH (ref 0.00–0.49)

## 2022-07-27 NOTE — Telephone Encounter (Signed)
Called patient to come in today to repeat D-Dimer. Linda Arnold

## 2022-07-27 NOTE — Assessment & Plan Note (Signed)
>>  ASSESSMENT AND PLAN FOR OSTEOARTHRITIS OF LEFT KNEE WRITTEN ON 07/27/2022  9:40 AM BY DAHBURA, ANTON, DO  No risk factors for DVT, opting to investigate with D-dimer. More likely to be related to osteoarthritis. If D-dimer positive, will explore with imaging.

## 2022-07-27 NOTE — Assessment & Plan Note (Signed)
No risk factors for DVT, opting to investigate with D-dimer. More likely to be related to osteoarthritis. If D-dimer positive, will explore with imaging.

## 2022-07-28 ENCOUNTER — Telehealth: Payer: Self-pay | Admitting: Student

## 2022-07-28 NOTE — Telephone Encounter (Signed)
Called patient to discuss recent lab (D-dimer). Age associated cutoff negative for DVT. Did not recommend imaging. She notes the knee has not worsened. Advised her to let us know if pain or swelling worsens. Keep follow-up with Dr. Phineas Real.

## 2022-07-30 LAB — D-DIMER, QUANTITATIVE

## 2022-07-30 LAB — SPECIMEN STATUS REPORT

## 2022-08-16 ENCOUNTER — Ambulatory Visit (INDEPENDENT_AMBULATORY_CARE_PROVIDER_SITE_OTHER): Payer: Medicare HMO | Admitting: *Deleted

## 2022-08-16 DIAGNOSIS — Z0000 Encounter for general adult medical examination without abnormal findings: Secondary | ICD-10-CM | POA: Diagnosis not present

## 2022-08-16 NOTE — Progress Notes (Addendum)
Subjective:   Linda Arnold is a 72 y.o. female who presents for Medicare Annual (Subsequent) preventive examination.  I connected with  Nelida Gores on 08/16/22 by a telephone enabled telemedicine application and verified that I am speaking with the correct person using two identifiers.   I discussed the limitations of evaluation and management by telemedicine. The patient expressed understanding and agreed to proceed.  Patient location: home  Provider location: telephone/home    Review of Systems     Cardiac Risk Factors include: advanced age (>36men, >7 women);diabetes mellitus;hypertension     Objective:    Today's Vitals   08/16/22 1406  PainSc: 6    There is no height or weight on file to calculate BMI.     08/16/2022    2:06 PM 07/26/2022    3:00 PM 06/16/2022    3:15 PM 03/30/2022    9:55 AM 03/15/2022    1:44 PM 08/24/2021    3:04 PM 06/15/2021   12:14 PM  Advanced Directives  Does Patient Have a Medical Advance Directive? No No No No No No No  Would patient like information on creating a medical advance directive? No - Patient declined Yes (MAU/Ambulatory/Procedural Areas - Information given) Yes (MAU/Ambulatory/Procedural Areas - Information given) No - Patient declined No - Patient declined No - Patient declined     Current Medications (verified) Outpatient Encounter Medications as of 08/16/2022  Medication Sig   ACCU-CHEK GUIDE test strip USE AS DIRECTED   Alcohol Swabs (DROPSAFE ALCOHOL PREP) 70 % PADS USE FOUR TIMES DAILY AS NEEDED   ASPIRIN 81 PO Take 81 mg by mouth daily.   Blood Glucose Monitoring Suppl (ACCU-CHEK GUIDE) w/Device KIT USE AS DIRECTED   Calcium Citrate (CITRACAL PO) Take 1 tablet by mouth daily.   lidocaine (LIDODERM) 5 % Place 1 patch onto the skin daily. Remove & Discard patch within 12 hours or as directed by MD   losartan (COZAAR) 25 MG tablet TAKE 1 TABLET EVERY DAY   metFORMIN (GLUCOPHAGE) 500 MG tablet Take 1 tablet (500 mg  total) by mouth daily with breakfast.   mirabegron ER (MYRBETRIQ) 25 MG TB24 tablet Take 1 tablet (25 mg total) by mouth daily.   pravastatin (PRAVACHOL) 40 MG tablet Take 1 tablet (40 mg total) by mouth daily.   No facility-administered encounter medications on file as of 08/16/2022.    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   Diabetes mellitus without complication (HCC) 2005   Dysphagia    Hyperlipidemia    Hypertension    Memory loss    Past Surgical History:  Procedure Laterality Date   CESAREAN SECTION     TUBAL LIGATION     Family History  Problem Relation Age of Onset   Diabetes Mother        died at age 98   Congestive Heart Failure Mother    Diabetes Sister    Hyperlipidemia Sister    Hypertension Sister    Cancer Brother 5       Lung cancer x2 brothers   Hypertension Brother    Other Father        died in [place] from not being able to "pass his urine"   Social History   Socioeconomic History   Marital status: Widowed    Spouse name: Not on file   Number of children: 1   Years of  education: 14   Highest education level: Associate degree: academic program  Occupational History   Occupation: retired  Tobacco Use   Smoking status: Never    Passive exposure: Never   Smokeless tobacco: Never  Vaping Use   Vaping Use: Never used  Substance and Sexual Activity   Alcohol use: Never   Drug use: No   Sexual activity: Not Currently  Other Topics Concern   Not on file  Social History Narrative   Patient lives alone in [place].    Patient is a widower and has one son located in [place].    Patient walks daily for exercise.    Patient enjoys reading, shopping, friends, and walking.    Social Determinants of Health   Financial Resource Strain: Low Risk  (08/16/2022)   Overall Financial Resource Strain (CARDIA)    Difficulty of Paying Living Expenses: Not hard  at all  Food Insecurity: No Food Insecurity (08/16/2022)   Hunger Vital Sign    Worried About Running Out of Food in the Last Year: Never true    Ran Out of Food in the Last Year: Never true  Transportation Needs: No Transportation Needs (08/16/2022)   PRAPARE - Administrator, Civil Service (Medical): No    Lack of Transportation (Non-Medical): No  Physical Activity: Inactive (08/16/2022)   Exercise Vital Sign    Days of Exercise per Week: 0 days    Minutes of Exercise per Session: 0 min  Stress: No Stress Concern Present (08/16/2022)   [employer] of Occupational Health - Occupational Stress Questionnaire    Feeling of Stress : Not at all  Social Connections: Moderately Integrated (08/16/2022)   Social Connection and Isolation Panel [NHANES]    Frequency of Communication with Friends and Family: More than three times a week    Frequency of Social Gatherings with Friends and Family: Twice a week    Attends Religious Services: More than 4 times per year    Active Member of [employer] or Organizations: Yes    Attends Banker Meetings: 1 to 4 times per year    Marital Status: Widowed    Tobacco Counseling Counseling given: Not Answered   Clinical Intake:  Pre-visit preparation completed: Yes  Pain : 0-10 Pain Score: 6  Pain Type: Chronic pain Pain Location: Knee Pain Orientation: Left Pain Descriptors / Indicators: Burning, Constant Pain Onset: 1 to 4 weeks ago Pain Frequency: Constant Pain Relieving Factors: tylenol  Pain Relieving Factors: tylenol  Nutritional Risks: None Diabetes: Yes CBG done?: No Did pt. bring in CBG monitor from home?: No  How often do you need to have someone help you when you read instructions, pamphlets, or other written materials from your doctor or pharmacy?: 1 - Never  Diabetic?  Yes  Nutrition Risk Assessment:  Has the patient had any N/V/D within the last 2 months?  No  Does the patient have any non-healing  wounds?  No  Has the patient had any unintentional weight loss or weight gain?  No   Diabetes:  Is the patient diabetic?  Yes  If diabetic, was a CBG obtained today?  No  Did the patient bring in their glucometer from home?  No  How often do you monitor your CBG's? 2 x day.   Financial Strains and Diabetes Management:  Are you having any financial strains with the device, your supplies or your medication? No .  Does the patient want to be seen by Chronic Care Management  for management of their diabetes?  No  Would the patient like to be referred to a Nutritionist or for Diabetic Management?  No   Diabetic Exams:  Diabetic Eye Exam: Completed . Pt has been advised about the importance in completing this exam. A referral has been placed today. Message sent to referral coordinator for scheduling purposes. Advised pt to expect a call from office referred to regarding appt.  Diabetic Foot Exam: Pt has been advised about the importance in completing this exam.    Interpreter Needed?: No  Information entered by :: Remi Haggard LPN   Activities of Daily Living    08/16/2022    2:10 PM  In your present state of health, do you have any difficulty performing the following activities:  Hearing? 0  Vision? 0  Difficulty concentrating or making decisions? 0  Walking or climbing stairs? 1  Dressing or bathing? 0  Doing errands, shopping? 0  Preparing Food and eating ? N  Using the Toilet? N  In the past six months, have you accidently leaked urine? N  Do you have problems with loss of bowel control? N  Managing your Medications? N  Managing your Finances? N  Housekeeping or managing your Housekeeping? N    Patient Care Team: Evette Georges, MD as PCP - General (Family Medicine) Edwin Cap, DPM as Consulting Physician (Podiatry) Glean Salvo, NP as Nurse Practitioner (Neurology)  Indicate any recent Medical Services you may have received from other than [facility] providers in the  past year (date may be approximate).     Assessment:   This is a routine wellness examination for [place].  Hearing/Vision screen Hearing Screening - Comments:: No trouble hearing Vision Screening - Comments:: [facility] Up to date  Dietary issues and exercise activities discussed: Current Exercise Habits: Home exercise routine, Type of exercise: walking, Time (Minutes): 30, Frequency (Times/Week): 4, Weekly Exercise (Minutes/Week): 120, Intensity: Mild   Goals Addressed             This Visit's Progress    DIET - REDUCE PORTION SIZE   On track    Patient Stated       Continue current lifestyle       Depression Screen    08/16/2022    2:16 PM 07/26/2022    2:59 PM 06/16/2022    3:15 PM 03/30/2022    9:54 AM 03/15/2022    2:12 PM 03/15/2022    1:51 PM 08/24/2021    3:04 PM  PHQ 2/9 Scores  PHQ - 2 Score 0 0 0 0 0 0 0  PHQ- 9 Score 3 8 5 7 5 5 6     Fall Risk    08/16/2022    2:13 PM 07/26/2022    3:00 PM 03/30/2022    9:55 AM 03/15/2022    1:48 PM 08/24/2021    3:05 PM  Fall Risk   Falls in the past year? 0 0 0 0 0  Number falls in past yr: 0 0 0 0 0  Injury with Fall? 0 0 0 0 0  Risk for fall due to :    No Fall Risks   Follow up Falls evaluation completed;Education provided;Falls prevention discussed        FALL RISK PREVENTION PERTAINING TO THE HOME:  Any stairs in or around the home? Yes  If so, are there any without handrails? No  Home free of loose throw rugs in walkways, pet beds, electrical cords, etc? Yes  Adequate lighting in your home to reduce risk of falls? Yes   ASSISTIVE DEVICES UTILIZED TO PREVENT FALLS:  Life alert? No  Use of a cane, walker or w/c? Yes  Grab bars in the bathroom? No  Shower chair or bench in shower? No  Elevated toilet seat or a handicapped toilet? No   TIMED UP AND GO:  Was the test performed? No .    Cognitive Function:    05/24/2022    2:16 PM 05/20/2021   12:47 PM 05/20/2020   12:41 PM 05/20/2019    2:43 PM  12/20/2018   10:00 AM  MMSE - Mini Mental State Exam  Orientation to time 5 5 5 5 5   Orientation to Place 5 5 5 5 5   Registration 3 3 3 3 3   Attention/ Calculation 4 4 5 5 5   Recall 3 3 2 3 3   Language- name 2 objects 2 2 2 2 2   Language- repeat 1 1 1 1 1   Language- follow 3 step command 3 3 3 3 3   Language- read & follow direction 1 1 1 1 1   Write a sentence 1 1 1 1 1   Copy design 1 1 1 1 1   Total score 29 29 29 30 30         08/16/2022    2:12 PM 11/04/2020    3:48 PM  6CIT Screen  What Year? 0 points 0 points  What month? 0 points 0 points  What time? 0 points 0 points  Count back from 20 0 points 0 points  Months in reverse 0 points 0 points  Repeat phrase 0 points 0 points  Total Score 0 points 0 points    Immunizations Immunization History  Administered Date(s) Administered   Fluad Quad(high Dose 65+) 12/01/2020, 11/25/2021   Influenza Split 03/15/2012   Influenza, High Dose Seasonal PF 11/27/2017, 11/20/2018   Influenza,inj,Quad PF,6+ Mos 11/22/2012   Moderna Sars-Covid-2 Vaccination 06/19/2019, 07/17/2019   PFIZER Comirnaty(Gray Top)Covid-19 Tri-Sucrose Vaccine 04/07/2020   PNEUMOCOCCAL CONJUGATE-20 03/15/2022   Pfizer Covid-19 Vaccine Bivalent Booster 28yrs & up 02/19/2021   Pneumococcal Polysaccharide-23 10/01/2004, 11/22/2012   Td 10/01/2004    TDAP status: Due, Education has been provided regarding the importance of this vaccine. Advised may receive this vaccine at local pharmacy or Health Dept. Aware to provide a copy of the vaccination record if obtained from local pharmacy or Health Dept. Verbalized acceptance and understanding.  Flu Vaccine status: Up to date  Pneumococcal vaccine status: Up to date  Covid-19 vaccine status: Information provided on how to obtain vaccines.   Qualifies for Shingles Vaccine? Yes   Zostavax completed No   Shingrix Completed?: No.    Education has been provided regarding the importance of this vaccine. Patient has been  advised to call insurance company to determine out of pocket expense if they have not yet received this vaccine. Advised may also receive vaccine at local pharmacy or Health Dept. Verbalized acceptance and understanding.  Screening Tests Health Maintenance  Topic Date Due   DTaP/Tdap/Td (2 - Tdap) 10/02/2014   COVID-19 Vaccine (5 - 2023-24 season) 09/01/2022 (Originally 11/05/2021)   Zoster Vaccines- Shingrix (1 of 2) 11/16/2022 (Originally 03/06/1999)   HEMOGLOBIN A1C  09/13/2022   INFLUENZA VACCINE  10/06/2022   FOOT EXAM  12/29/2022   OPHTHALMOLOGY EXAM  03/15/2023   Diabetic kidney evaluation - eGFR measurement  03/16/2023   Diabetic kidney evaluation - Urine ACR  03/16/2023   Medicare Annual Wellness (AWV)  08/16/2023   Fecal DNA (Cologuard)  12/10/2023   MAMMOGRAM  03/29/2024   Pneumonia Vaccine 1+ Years old  Completed   DEXA SCAN  Completed   Hepatitis C Screening  Completed   HPV VACCINES  Aged Out    Health Maintenance  Health Maintenance Due  Topic Date Due   DTaP/Tdap/Td (2 - Tdap) 10/02/2014    Colorectal cancer screening: Type of screening: Cologuard. Completed 2022. Repeat every 3 years  Mammogram status: Completed  . Repeat every year  Bone Density status: Completed 2022. Results reflect: Bone density results: NORMAL. Repeat every 5 years.  Lung Cancer Screening: (Low Dose CT Chest recommended if Age 77-80 years, 30 pack-year currently smoking OR have quit w/in 15years.) does not qualify.   Lung Cancer Screening Referral:   Additional Screening:  Hepatitis C Screening: does not qualify; Completed 2022  Vision Screening: Recommended annual ophthalmology exams for early detection of glaucoma and other disorders of the eye. Is the patient up to date with their annual eye exam?  Yes  Who is the provider or what is the name of the office in which the patient attends annual eye exams? [facility] If pt is not established with a provider, would they like to be  referred to a provider to establish care? No .   Dental Screening: Recommended annual dental exams for proper oral hygiene  Community Resource Referral / Chronic Care Management: CRR required this visit?  No   CCM required this visit?  No      Plan:     I have personally reviewed and noted the following in the patient's chart:   Medical and social history Use of alcohol, tobacco or illicit drugs  Current medications and supplements including opioid prescriptions. Patient is not currently taking opioid prescriptions. Functional ability and status Nutritional status Physical activity Advanced directives List of other physicians Hospitalizations, surgeries, and ER visits in previous 12 months Vitals Screenings to include cognitive, depression, and falls Referrals and appointments  In addition, I have reviewed and discussed with patient certain preventive protocols, quality metrics, and best practice recommendations. A written personalized care plan for preventive services as well as general preventive health recommendations were provided to patient.     Remi Haggard, LPN   1/61/0960   Nurse Notes:      I have reviewed this visit and agree with the documentation.  Terisa Starr, MD  Family Medicine Teaching Service

## 2022-08-18 ENCOUNTER — Ambulatory Visit (INDEPENDENT_AMBULATORY_CARE_PROVIDER_SITE_OTHER): Payer: Medicare HMO | Admitting: Family Medicine

## 2022-08-18 ENCOUNTER — Encounter: Payer: Self-pay | Admitting: Family Medicine

## 2022-08-18 VITALS — BP 129/78 | HR 81 | Ht 63.0 in | Wt 177.6 lb

## 2022-08-18 DIAGNOSIS — I10 Essential (primary) hypertension: Secondary | ICD-10-CM | POA: Diagnosis not present

## 2022-08-18 DIAGNOSIS — M546 Pain in thoracic spine: Secondary | ICD-10-CM | POA: Diagnosis not present

## 2022-08-18 DIAGNOSIS — M25562 Pain in left knee: Secondary | ICD-10-CM

## 2022-08-18 DIAGNOSIS — M25462 Effusion, left knee: Secondary | ICD-10-CM | POA: Diagnosis not present

## 2022-08-18 DIAGNOSIS — E119 Type 2 diabetes mellitus without complications: Secondary | ICD-10-CM | POA: Diagnosis not present

## 2022-08-18 DIAGNOSIS — N319 Neuromuscular dysfunction of bladder, unspecified: Secondary | ICD-10-CM

## 2022-08-18 LAB — POCT GLYCOSYLATED HEMOGLOBIN (HGB A1C): HbA1c, POC (controlled diabetic range): 6 % (ref 0.0–7.0)

## 2022-08-18 NOTE — Patient Instructions (Signed)
It was great to see you today! Here's what we talked about:  I will refer you to Pelvic Rehab to help strengthen the muscles in your pelvis. If these exercises do not help, we will refer you to urology for further evaluation. I will send in an x-ray for your knee. You can walk in to get your x-ray done at any time. I think this is arthritis, but we want to be sure. In the meantime, use tylenol and voltaren gel for pain relief.  Please let me know if you have any other questions.  Dr. Phineas Real

## 2022-08-18 NOTE — Assessment & Plan Note (Signed)
A1c 6.0 today.  Continue current management.

## 2022-08-18 NOTE — Progress Notes (Deleted)
    SUBJECTIVE:   Chief compliant/HPI: annual examination  LAUREE YURICK is a 72 y.o. who presents today for an annual exam.  L knee pain Since 06/21/22. All the time. Dull achy pain in popliteal fossa that radiates down the lateral side of her L leg and sometimes to the medial foot at bunion site. Only numbness/tingling at the bunion site. No pain when walking or when touching it. Not much worse when bending. Was previously swollen. No increased warmth or redness. No trauma. No previous leg fractures. Has arthritis.   Back pain Better with pads.  Urinary frequency Protein notmal. Foamy urine.  History tabs reviewed and updated ***.   Review of systems form reviewed and notable for ***.   OBJECTIVE:   There were no vitals taken for this visit.  General: Alert and oriented, in NAD Skin: Warm, dry, and intact without lesions HEENT: NCAT, EOM grossly normal, midline nasal septum Cardiac: RRR, no m/r/g appreciated Respiratory: CTAB, breathing and speaking comfortably on RA Abdominal: Soft, nontender, nondistended, normoactive bowel sounds Extremities: Moves all extremities grossly equally Neurological: No gross focal deficit Psychiatric: Appropriate mood and affect  ASSESSMENT/PLAN:   No problem-specific Assessment & Plan notes found for this encounter.   Annual Examination  See AVS for age appropriate recommendations  PHQ score ***, reviewed and discussed.  BP reviewed and at goal ***.  Asked about intimate partner violence and resources given as appropriate  Advance directives discussion ***  Considered the following items based upon USPSTF recommendations: Diabetes screening: ordered, 5.9 in 03/2022 Screening for elevated cholesterol: discussed - WNL in 03/2022 HIV testing:  one time testing done, negative Hepatitis C:  one time testing done, negative Hepatitis B: {discussed/ordered:14545} Syphilis if at high risk: {discussed/ordered:14545} GC/CT {GC/CT screening  :23818} Osteoporosis screening considered based upon risk of fracture from [facility] calculator. Major osteoporotic fracture risk is 11%. DEXA - repeat scan not ordered given normal DEXA in 2022  Cervical cancer screening:  aged out, previously normal Breast cancer screening:  normal in 03/2022 Colorectal cancer screening: {crcscreen:23821::"discussed, colonoscopy ordered"} Lung cancer screening: {discussed/declined/written MVHQ:46962}. See documentation below regarding indications/risks/benefits.  Vaccinations ***.   Follow up in 1 year or sooner if indicated.   Janeal Holmes, MD [facility]

## 2022-08-18 NOTE — Assessment & Plan Note (Signed)
Interstitial cystitis versus overactive bladder.  Refractory to medication management.  Discussed referral to pelvic floor physical therapy given likely some element of muscular dysfunction in her age.  Will place referral.  Can consider urologic referral should pelvic floor PT be unhelpful.

## 2022-08-18 NOTE — Assessment & Plan Note (Signed)
Likely osteoarthritis.  Pain on valgus testing could be due to degenerative meniscal tear which is secondary to age-related changes like OA.  However, given continued pain, will evaluate with left knee x-ray.  Reassured by normal ambulation demonstrated by patient as well as lack of erythema, lack of swelling, and negative D-dimer at last visit.  In the meantime, advised treatment with Tylenol and Voltaren gel for pain.

## 2022-08-18 NOTE — Progress Notes (Signed)
    SUBJECTIVE:   CHIEF COMPLAINT / HPI:   L knee pain Since 06/21/22. All the time. Dull achy pain in popliteal fossa that radiates down the lateral side of her L leg and sometimes to the medial foot at bunion site. Only numbness/tingling at the bunion site. No pain when walking or when touching it. Not much worse when bending. Was previously swollen. No increased warmth or redness. No trauma. No previous leg fractures. Has arthritis in her right leg.  Was last seen on May 21 for this.  D-dimer was collected and negative.  Back pain Better with Lidoderm patches.  Not had much pain at all.  Urinary frequency Has been dealing with this for multiple months at this point.  No improvement with mirabegron, Pyridium.  No evidence of infection on recent UA or urine culture.  Continues to have symptoms consistent with overactive bladder.  PERTINENT  PMH / PSH: Type 2 diabetes, sleep apnea, hypertension, HLD  OBJECTIVE:   BP 129/78   Pulse 81   Ht 5\' 3"  (1.6 m)   Wt 177 lb 9.6 oz (80.6 kg)   SpO2 99%   BMI 31.46 kg/m   General: Alert and oriented, in NAD Skin: Warm, dry, and intact without lesions HEENT: NCAT, EOM grossly normal, midline nasal septum Cardiac: RRR, no m/r/g appreciated Respiratory: CTAB, breathing and speaking comfortably on RA Abdominal: Soft, nontender, nondistended, normoactive bowel sounds Extremities: Moves all extremities grossly equally; left knee without obvious effusion, erythema, excessive warmth, pain to flexion or extension, or pain to palpation of knee or popliteal fossa; mild pain exhibited with valgus stress, crepitus appreciated on flexion and extension Neurological: No gross focal deficit, ambulates well without difficulty Psychiatric: Appropriate mood and affect   ASSESSMENT/PLAN:   Hypertension BP controlled on current regimen.   Diabetes mellitus type 2, noninsulin dependent (HCC) A1c 6.0 today.  Continue current management.  Pain and swelling of  left knee Likely osteoarthritis.  Pain on valgus testing could be due to degenerative meniscal tear which is secondary to age-related changes like OA.  However, given continued pain, will evaluate with left knee x-ray.  Reassured by normal ambulation demonstrated by patient as well as lack of erythema, lack of swelling, and negative D-dimer at last visit.  In the meantime, advised treatment with Tylenol and Voltaren gel for pain.  Acute midline thoracic back pain Improved.  Continue lidocaine patches as needed.  Interstitial cystitis Interstitial cystitis versus overactive bladder.  Refractory to medication management.  Discussed referral to pelvic floor physical therapy given likely some element of muscular dysfunction in her age.  Will place referral.  Can consider urologic referral should pelvic floor PT be unhelpful.   Janeal Holmes, MD [facility]

## 2022-08-18 NOTE — Assessment & Plan Note (Signed)
>>  ASSESSMENT AND PLAN FOR OSTEOARTHRITIS OF LEFT KNEE WRITTEN ON 08/18/2022  4:40 PM BY [place] Dodgen, MD  Likely osteoarthritis.  Pain on valgus testing could be due to degenerative meniscal tear which is secondary to age-related changes like OA.  However, given continued pain, will evaluate with left knee x-ray.  Reassured by normal ambulation demonstrated by patient as well as lack of erythema, lack of swelling, and negative D-dimer at last visit.  In the meantime, advised treatment with Tylenol  and Voltaren gel for pain.

## 2022-08-18 NOTE — Assessment & Plan Note (Signed)
BP controlled on current regimen.

## 2022-08-18 NOTE — Assessment & Plan Note (Signed)
Improved.  Continue lidocaine patches as needed.

## 2022-08-19 ENCOUNTER — Ambulatory Visit
Admission: RE | Admit: 2022-08-19 | Discharge: 2022-08-19 | Disposition: A | Discharge status: Home / Self Care | Payer: Medicare HMO | Source: Ambulatory Visit | Attending: Family Medicine | Admitting: Family Medicine

## 2022-08-19 DIAGNOSIS — M25562 Pain in left knee: Secondary | ICD-10-CM | POA: Diagnosis not present

## 2022-08-19 DIAGNOSIS — M25462 Effusion, left knee: Secondary | ICD-10-CM

## 2022-08-19 DIAGNOSIS — M1712 Unilateral primary osteoarthritis, left knee: Secondary | ICD-10-CM | POA: Diagnosis not present

## 2022-08-26 ENCOUNTER — Encounter: Payer: Self-pay | Admitting: Family Medicine

## 2022-08-26 ENCOUNTER — Telehealth: Payer: Self-pay

## 2022-08-26 NOTE — Telephone Encounter (Signed)
Patient calls nurse line to discuss results of recent X-ray. She reports that she is still continuing to have "heavy amount of pain."  She also states that she has not heard anything regarding referral. She wanted to see if she could get an update on the status of this. Will forward to referral coordinator.   Veronda Prude, RN

## 2022-08-29 NOTE — Telephone Encounter (Signed)
Called patient to discuss knee XR results of arthrosis which is likely cause of pain. Discussed adding ibuprofen 600 mg to tylenol 650 mg Q6H scheduled for one week given good kidney function. Advised to eat with meals. If not improved, advised patient to make another appointment for further evaluation and potential referral to PT vs knee injection. She is not interested in either PT or injection at this time.

## 2022-08-29 NOTE — Telephone Encounter (Signed)
Called pt to let her know the PT referral has been sent to:  [facility] Address: [address], [place], [place] [address]  Phone: [phone]  Sunday Spillers, CMA

## 2022-09-21 ENCOUNTER — Other Ambulatory Visit: Payer: Self-pay

## 2022-09-21 MED ORDER — METFORMIN HCL 500 MG PO TABS: 500.0000 mg | ORAL_TABLET | Freq: Every day | ORAL | 2 refills | Status: DC

## 2022-10-06 ENCOUNTER — Encounter: Payer: Self-pay | Admitting: Podiatry

## 2022-10-06 ENCOUNTER — Ambulatory Visit: Payer: Medicare HMO | Admitting: Podiatry

## 2022-10-06 DIAGNOSIS — M79675 Pain in left toe(s): Secondary | ICD-10-CM

## 2022-10-06 DIAGNOSIS — B351 Tinea unguium: Secondary | ICD-10-CM

## 2022-10-06 DIAGNOSIS — M79674 Pain in right toe(s): Secondary | ICD-10-CM

## 2022-10-06 DIAGNOSIS — L84 Corns and callosities: Secondary | ICD-10-CM

## 2022-10-06 DIAGNOSIS — E119 Type 2 diabetes mellitus without complications: Secondary | ICD-10-CM

## 2022-10-09 NOTE — Progress Notes (Signed)
  Subjective:  Patient ID: Linda Arnold, female    DOB: Jul 22, 1950,  MRN: [id number]  Chief Complaint  Patient presents with   Diabetes    "Trim my toenail, corns, and calluses."    72 y.o. female in for follow-up with the above complaint. History confirmed with patient.  No new issues since last visit.  She still using urea cream and calluses.  Nails are thickened elongated and painful again.  Intermittent debridement has been very helpful.  Her diabetes remains very well controlled  Objective:  Physical Exam: warm, good capillary refill, no trophic changes or ulcerative lesions, normal DP and PT pulses and normal sensory exam.  Onychomycosis x10 with thickened elongated discolored nails Left Foot: Hallux valgus with bunion present, semireducible hammertoes present Right Foot: Mild hallux valgus with bunion present, semireducible hammertoes present, tailor's bunion present, hyperkeratosis submet 2-3, submet 5, submet 5 is very painful    Assessment:   1. Pain due to onychomycosis of toenails of both feet   2. Callus of foot   3. Diabetes mellitus type 2, noninsulin dependent (HCC)       Plan:  Patient was evaluated and treated and all questions answered.     All symptomatic hyperkeratoses were safely debrided with a sterile #15 blade to patient's level of comfort without incident. We discussed preventative and palliative care of these lesions including supportive and accommodative shoegear, padding, prefabricated and custom molded accommodative orthoses, use of a pumice stone and lotions/creams daily.  Discussed the etiology and treatment options for the condition in detail with the patient.  Regular admit debridement has been helpful in reducing pain and improving function.  Recommended debridement of the nails today. Sharp and mechanical debridement performed of all painful and mycotic nails today. Nails debrided in length and thickness using a nail nipper and a mechanical  burr to level of comfort. Discussed treatment options including appropriate shoe gear. Follow up as needed for painful nails.    Return in about 3 months (around 01/06/2023) for at risk diabetic foot care.

## 2022-10-26 ENCOUNTER — Ambulatory Visit: Payer: Medicare HMO

## 2022-10-26 NOTE — Therapy (Deleted)
OUTPATIENT PHYSICAL THERAPY FEMALE PELVIC EVALUATION   Patient Name: Linda Arnold MRN: [id number] DOB:1951/01/29, 72 y.o., female Today's Date: 10/26/2022  END OF SESSION:   Past Medical History:  Diagnosis Date   Arthritis 1999   knees   Diabetes mellitus without complication (HCC) 2005   Dysphagia    Hyperlipidemia    Hypertension    Memory loss    Past Surgical History:  Procedure Laterality Date   CESAREAN SECTION     TUBAL LIGATION     Patient Active Problem List   Diagnosis Date Noted   Bladder dysfunction 08/18/2022   Pain and swelling of left knee 07/26/2022   Acute midline thoracic back pain 06/16/2022   Interstitial cystitis 03/30/2022   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  Rationale for Evaluation and Treatment: {HABREHAB:27488}  ONSET DATE: ***  SUBJECTIVE:                                                                                                                                                                                           SUBJECTIVE STATEMENT: *** Fluid intake: {Yes/No:304960894}   PAIN:  Are you having pain? {yes/no:20286} NPRS scale: ***/10 Pain location: {pelvic pain location:27098}  Pain type: {type:313116} Pain description: {PAIN DESCRIPTION:21022940}   Aggravating factors: *** Relieving factors: ***  PRECAUTIONS: {Therapy precautions:24002}  RED FLAGS: {PT Red Flags:29287}   WEIGHT BEARING RESTRICTIONS:  {Yes ***/No:24003}  FALLS:  Has patient fallen in last 6 months? {fallsyesno:27318}  LIVING ENVIRONMENT: Lives with: {OPRC lives with:25569::"lives with their family"} Lives in: {Lives in:25570} Stairs: {opstairs:27293} Has following equipment at home: {Assistive devices:23999}  OCCUPATION: ***  PLOF: {PLOF:24004}  PATIENT GOALS: ***  PERTINENT HISTORY:  *** Sexual abuse: {Yes/No:304960894}  BOWEL MOVEMENT: Pain with bowel movement: {yes/no:20286} Type of bowel movement:{PT BM type:27100} Fully empty rectum: {Yes/No:304960894} Leakage: {Yes/No:304960894} Pads: {Yes/No:304960894} Fiber supplement: {Yes/No:304960894}  URINATION: Pain with urination: {yes/no:20286} Fully empty bladder: {Yes/No:304960894} Stream: {PT urination:27102} Urgency: {Yes/No:304960894} Frequency: *** Leakage: {PT leakage:27103} Pads: {Yes/No:304960894}  INTERCOURSE: Pain with intercourse: {pain with intercourse PA:27099} Ability to have vaginal penetration:  {Yes/No:304960894} Climax: *** Marinoff Scale: ***/3  PREGNANCY: Vaginal  deliveries *** Tearing {Yes***/No:304960894} C-section deliveries *** Currently pregnant {Yes***/No:304960894}  PROLAPSE: {PT prolapse:27101}   OBJECTIVE:   DIAGNOSTIC FINDINGS:  ***  PATIENT SURVEYS:  {rehab surveys:24030}  PFIQ-7 ***  COGNITION: Overall cognitive status: {cognition:24006}     SENSATION: Light touch: {intact/deficits:24005} Proprioception: {intact/deficits:24005}  MUSCLE LENGTH: Hamstrings: Right *** deg; Left *** deg Thomas test: Right *** deg; Left *** deg  LUMBAR SPECIAL TESTS:  {lumbar special test:25242}  FUNCTIONAL TESTS:  {Functional tests:24029}  GAIT: Distance walked: *** Assistive device utilized: {Assistive devices:23999} Level of assistance: {Levels of assistance:24026} Comments: ***  POSTURE: {posture:25561}  PELVIC ALIGNMENT:  LUMBARAROM/PROM:  A/PROM A/PROM  eval  Flexion   Extension   Right  lateral flexion   Left lateral flexion   Right rotation   Left rotation    (Blank rows = not tested)  LOWER EXTREMITY ROM:  {AROM/PROM:27142} ROM Right eval Left eval  Hip flexion    Hip extension    Hip abduction    Hip adduction    Hip internal rotation    Hip external rotation    Knee flexion    Knee extension    Ankle dorsiflexion    Ankle plantarflexion    Ankle inversion    Ankle eversion     (Blank rows = not tested)  LOWER EXTREMITY MMT:  MMT Right eval Left eval  Hip flexion    Hip extension    Hip abduction    Hip adduction    Hip internal rotation    Hip external rotation    Knee flexion    Knee extension    Ankle dorsiflexion    Ankle plantarflexion    Ankle inversion    Ankle eversion     PALPATION:   General  ***                External Perineal Exam ***                             Internal Pelvic Floor ***  Patient confirms identification and approves PT to assess internal pelvic floor and treatment {yes/no:20286}  PELVIC MMT:   MMT eval  Vaginal   Internal Anal Sphincter   External Anal Sphincter   Puborectalis   Diastasis Recti   (Blank rows = not tested)        TONE: ***  PROLAPSE: ***  TODAY'S TREATMENT:                                                                                                                              DATE:  10/26/22 EVAL  Manual:  Neuromuscular re-education:  Exercises:  Therapeutic activities:     PATIENT EDUCATION:  Education details: See above Person educated: Patient Education method: Explanation, Demonstration, Actor cues, Verbal cues, and Handouts Education comprehension: verbalized understanding  HOME EXERCISE PROGRAM: ***  ASSESSMENT:  CLINICAL IMPRESSION: Patient is a *** y.o. ***  who was seen today for physical therapy evaluation and treatment for ***.   OBJECTIVE IMPAIRMENTS: {opptimpairments:25111}.   ACTIVITY LIMITATIONS:  {activitylimitations:27494}  PARTICIPATION LIMITATIONS: {participationrestrictions:25113}  PERSONAL FACTORS: {Personal factors:25162} are also affecting patient's functional outcome.   REHAB POTENTIAL: {rehabpotential:25112}  CLINICAL DECISION MAKING: {clinical decision making:25114}  EVALUATION COMPLEXITY: {Evaluation complexity:25115}   GOALS: Goals reviewed with patient? {yes/no:20286}  SHORT TERM GOALS: Target date: ***  Pt will be independent with HEP.   Baseline: Goal status: INITIAL  2.  *** Baseline:  Goal status: INITIAL  3.  *** Baseline:  Goal status: INITIAL  4.  *** Baseline:  Goal status: INITIAL  5.  *** Baseline:  Goal status: INITIAL  6.  *** Baseline:  Goal status: INITIAL  LONG TERM GOALS: Target date: ***  Pt will be independent with advanced HEP.   Baseline:  Goal status: INITIAL  2.  *** Baseline:  Goal status: INITIAL  3.  *** Baseline:  Goal status: INITIAL  4.  *** Baseline:  Goal status: INITIAL  5.  *** Baseline:  Goal status: INITIAL  6.  *** Baseline:  Goal status: INITIAL  PLAN:  PT FREQUENCY: 1-2x/week  PT DURATION: 8 weeks  PLANNED INTERVENTIONS: Therapeutic exercises, Therapeutic activity, Neuromuscular re-education, Balance training, Gait training, Patient/Family education, Self Care, Joint mobilization, Dry Needling, Biofeedback, and Manual therapy  PLAN FOR NEXT SESSION: ***   Julio Alm, PT, DPT08/21/247:51 AM

## 2022-11-22 ENCOUNTER — Ambulatory Visit (INDEPENDENT_AMBULATORY_CARE_PROVIDER_SITE_OTHER): Payer: Medicare HMO | Admitting: Family Medicine

## 2022-11-22 ENCOUNTER — Telehealth: Payer: Self-pay

## 2022-11-22 ENCOUNTER — Other Ambulatory Visit: Payer: Self-pay

## 2022-11-22 ENCOUNTER — Ambulatory Visit (INDEPENDENT_AMBULATORY_CARE_PROVIDER_SITE_OTHER): Payer: Medicare HMO | Admitting: Student

## 2022-11-22 ENCOUNTER — Encounter: Payer: Self-pay | Admitting: Family Medicine

## 2022-11-22 ENCOUNTER — Encounter: Payer: Self-pay | Admitting: Student

## 2022-11-22 VITALS — BP 147/80 | HR 77 | Ht 63.0 in | Wt 177.8 lb

## 2022-11-22 VITALS — BP 138/81 | HR 82 | Ht 63.0 in | Wt 178.6 lb

## 2022-11-22 DIAGNOSIS — M1712 Unilateral primary osteoarthritis, left knee: Secondary | ICD-10-CM | POA: Diagnosis not present

## 2022-11-22 DIAGNOSIS — G4730 Sleep apnea, unspecified: Secondary | ICD-10-CM

## 2022-11-22 DIAGNOSIS — I10 Essential (primary) hypertension: Secondary | ICD-10-CM | POA: Diagnosis not present

## 2022-11-22 DIAGNOSIS — Z23 Encounter for immunization: Secondary | ICD-10-CM

## 2022-11-22 DIAGNOSIS — E119 Type 2 diabetes mellitus without complications: Secondary | ICD-10-CM

## 2022-11-22 DIAGNOSIS — H6122 Impacted cerumen, left ear: Secondary | ICD-10-CM

## 2022-11-22 DIAGNOSIS — E7800 Pure hypercholesterolemia, unspecified: Secondary | ICD-10-CM | POA: Diagnosis not present

## 2022-11-22 DIAGNOSIS — R82998 Other abnormal findings in urine: Secondary | ICD-10-CM

## 2022-11-22 HISTORY — DX: Impacted cerumen, left ear: H61.22

## 2022-11-22 MED ORDER — PRAVASTATIN SODIUM 20 MG PO TABS
20.0000 mg | ORAL_TABLET | Freq: Every day | ORAL | 3 refills | Status: DC
Start: 2022-11-22 — End: 2022-11-23

## 2022-11-22 NOTE — Assessment & Plan Note (Addendum)
History, exam, and x-ray performed with likely osteoarthritis causing pain.  Reassured by lack of notable effusion or septic joint on exam.  DVT previously ruled out.  Discussed joint injection today given continued pain even with Tylenol and Motrin which patient would like to postpone for now.  Decided on physical therapy for now.  Also advised continuing Tylenol, Motrin, heating pads as needed.  Follow-up improvement after physical therapy, likely in 3 months.

## 2022-11-22 NOTE — Patient Instructions (Signed)
It was great to see you! Thank you for allowing me to participate in your care!  Our plans for today:  -Place 5 drops of Debrox earwax softening drops into the left ear twice a day.  After placing the drops, continue to lie on your right side for about 10 minutes. -Return in about 10 days for follow-up   Take care and seek immediate care sooner if you develop any concerns.   Dr. Erick Alley, DO [facility]

## 2022-11-22 NOTE — Assessment & Plan Note (Signed)
Sudden hearing loss of left ear likely due to impacted cerumen s/p irrigation. -5 drops of Debrox twice daily -Return in about 10 days for follow-up

## 2022-11-22 NOTE — Assessment & Plan Note (Addendum)
Lipid panel normal at last check earlier this year.  At strong patient preference given dietary changes, will go down from pravastatin 40 mg daily to pravastatin 20 mg daily.  Can recheck lipid panel in 3 months.

## 2022-11-22 NOTE — Progress Notes (Signed)
SUBJECTIVE:   CHIEF COMPLAINT / HPI:   Left knee and leg pain Pain is persistent.  Did let up with Tylenol and ibuprofen but started hurting again.  Mostly at night.  Does not get much better when getting up out of bed.  Hurts mostly with bending and extending the knee but can walk fine.  She felt that it has been more swollen than usual.  No redness or warmth.  No systemic symptoms.  Voltaren gel does not work well.  Has not tried heating pad.  Has not been to physical therapy or ever had joint injection.  Ear fullness Feels like she has something in her left ear.  It just feels funny.  No hearing issues.  No sick symptoms.  Bubbly urine Been going on for about a year now.  No pain associated with this.  No incontinence.  Was supposed to see pelvic physical therapy for presumed overactive bladder but was late to her appointment and does not want to go back.  Also has a history of presumed interstitial cystitis though this is different.  Sleep issues She is not able to sleep good.  She has been using her CPAP.  She tends to wake up throughout the night.  Has not tried melatonin because she is afraid of effects on dementia.  Cholesterol and diabetes Wants to check on her A1c today to see if she is to go up on metformin.  She would also like to go down on her cholesterol medicines since she has been making better food choices.  PERTINENT  PMH / PSH: hypertension  OBJECTIVE:   BP (!) 147/80   Pulse 77   Ht 5\' 3"  (1.6 m)   Wt 177 lb 12.8 oz (80.6 kg)   SpO2 100%   BMI 31.50 kg/m   General: Alert and oriented, in NAD Skin: Warm, dry, and intact without lesions HEENT: NCAT, EOM grossly normal with known exotropia, midline nasal septum, copious amounts of dried cerumen removed by irrigation as well as with curette in office with remaining cerumen deep in canal Cardiac: RRR, no m/r/g appreciated Respiratory: CTAB, breathing and speaking comfortably on RA Abdominal: Soft, nontender,  nondistended, normoactive bowel sounds Extremities/MSK: Moves all extremities grossly equally, pain of left knee with flexion beyond 90 degrees and full extension, no pain to palpation, no appreciable swelling/erythema/warmth, negative patellar ballottement test Neurological: No gross focal deficit Psychiatric: Appropriate mood and affect   ASSESSMENT/PLAN:   Osteoarthritis of left knee History, exam, and x-ray performed with likely osteoarthritis causing pain.  Reassured by lack of notable effusion or septic joint on exam.  DVT previously ruled out.  Discussed joint injection today given continued pain even with Tylenol and Motrin which patient would like to postpone for now.  Decided on physical therapy for now.  Also advised continuing Tylenol, Motrin, heating pads as needed.  Follow-up improvement after physical therapy, likely in 3 months.  Diabetes mellitus type 2, noninsulin dependent (HCC) Has been taking metformin 500 mg daily.  Will repeat A1c today and change therapy as needed.  Hypertension BP previously controlled on losartan 25 mg daily.  BP unfortunately not rechecked before going to lab.  Likely due to activity directly before measurement at beginning of visit.  Recheck at follow-up.  Sleep apnea Has been using CPAP nightly.  While continued sleep apnea could be impacting her sleep issues, appears we can make improvements in sleep hygiene.  Discussed not using screens for 1 hour before bed.  Avoid melatonin for now given age and cognitive impairment.  Follow-up as needed.  HYPERCHOLESTEROLEMIA Lipid panel normal at last check earlier this year.  At strong patient preference given dietary changes, will go down from pravastatin 40 mg daily to pravastatin 20 mg daily.  Can recheck lipid panel in 3 months.  Foamy urine Patient has longstanding history of urinary problems ranging from dysuria, bladder dysfunction, and possible interstitial cystitis.  Given new symptom of foamy  urine, will recheck UA to rule out new onset proteinuria.  Also consider normal mild urine bubbling versus mild dehydration.  Reassured by negative dysuria/frequency today as well as normal kidney function, normal UA, normal urine culture earlier this year.  Cerumen impaction Symptoms of fullness improved after removal of copious amount of cerumen.  Advised using Debrox for the deeper wax.  Can discuss ENT referral should symptoms return and inability to remove impacted wax.  Health maintenance Patient received her flu vaccine today.  Janeal Holmes, MD [facility]

## 2022-11-22 NOTE — Telephone Encounter (Signed)
Patient calls nurse line requesting to speak with PCP.   She report she was seen this morning and had her ear "cleaned out." She reports since irrigation she has not been able to hear out of her ear.   She denies any pain or pain during irrigation. Denies any blood or pus coming from her ear.  Patient demanded to be seen today for evaluation.   Patient scheduled with Yetta Barre.

## 2022-11-22 NOTE — Assessment & Plan Note (Signed)
Has been taking metformin 500 mg daily.  Will repeat A1c today and change therapy as needed.

## 2022-11-22 NOTE — Telephone Encounter (Signed)
Error

## 2022-11-22 NOTE — Assessment & Plan Note (Signed)
Has been using CPAP nightly.  While continued sleep apnea could be impacting her sleep issues, appears we can make improvements in sleep hygiene.  Discussed not using screens for 1 hour before bed.  Avoid melatonin for now given age and cognitive impairment.  Follow-up as needed.

## 2022-11-22 NOTE — Assessment & Plan Note (Signed)
BP previously controlled on losartan 25 mg daily.  BP unfortunately not rechecked before going to lab.  Likely due to activity directly before measurement at beginning of visit.  Recheck at follow-up.

## 2022-11-22 NOTE — Patient Instructions (Addendum)
I have referred you to physical therapy for your knee. Let me know if you would like a joint injection next. We cleaned out your ear today due to earwax. Use Debrox drops in the ear to help soften more of it. I have gotten a urine study to test given your foamy urine. You obtained your flu shot. Try to be without screens for at least 1 hours prior to bed. Try this before melatonin as this can cause you to be dizzy/sleepy. I have sent in 20 mg pravastatin. We will recheck your cholesterol in 3 months. I have rechecked your A1c. I will call you if you need to go up on metformin dosing. Let me know if you need anything else before you come back in 3 months.

## 2022-11-22 NOTE — Assessment & Plan Note (Signed)
>>  ASSESSMENT AND PLAN FOR OSTEOARTHRITIS OF LEFT KNEE WRITTEN ON 11/22/2022  2:07 PM BY Joseth Weigel, MD  History, exam, and x-ray performed with likely osteoarthritis causing pain.  Reassured by lack of notable effusion or septic joint on exam.  DVT previously ruled out.  Discussed joint injection today given continued pain even with Tylenol  and Motrin  which patient would like to postpone for now.  Decided on physical therapy for now.  Also advised continuing Tylenol , Motrin , heating pads as needed.  Follow-up improvement after physical therapy, likely in 3 months.

## 2022-11-22 NOTE — Progress Notes (Signed)
    SUBJECTIVE:   CHIEF COMPLAINT / HPI:   Hearing loss L ear Started suddenly today after appointment with Dr. Phineas Real where ears were cleaned out via irrigation.  Per his note, he was unable to remove cerumen deep in canal and prescribed her Debrox ear wax softening eardrops  PERTINENT  PMH / PSH:   OBJECTIVE:   BP 138/81   Pulse 82   Ht 5\' 3"  (1.6 m)   Wt 178 lb 9.6 oz (81 kg)   SpO2 99%   BMI 31.64 kg/m    General: NAD, pleasant, able to participate in exam Cardiac: Well-perfused Respiratory: Breathing comfortably on room air HEENT: Right TM nonbulging with cone of light present.  Left TM not visualized due to impacted cerumen up against ear canal, left ear canal appears some slightly erythematous/irritated   ASSESSMENT/PLAN:   Impacted cerumen of left ear Sudden hearing loss of left ear likely due to impacted cerumen s/p irrigation. -5 drops of Debrox twice daily -Return in about 10 days for follow-up     Dr. Erick Alley, DO [place] [facility]

## 2022-11-23 ENCOUNTER — Telehealth: Payer: Self-pay | Admitting: Family Medicine

## 2022-11-23 DIAGNOSIS — E7800 Pure hypercholesterolemia, unspecified: Secondary | ICD-10-CM

## 2022-11-23 DIAGNOSIS — H6122 Impacted cerumen, left ear: Secondary | ICD-10-CM

## 2022-11-23 MED ORDER — PRAVASTATIN SODIUM 20 MG PO TABS
20.0000 mg | ORAL_TABLET | Freq: Every day | ORAL | 3 refills | Status: DC
Start: 2022-11-23 — End: 2023-04-12

## 2022-11-23 NOTE — Telephone Encounter (Signed)
Called patient to discuss test results. UA negative. Advised to let me know with continued symptoms or onset of pain. A1c stable at 6. Continue current metformin dosage. Discussed also her decreased hearing after cerumen removal with residual impaction. Patient states she can hear a little bit in her L ear but it is decreased compared to the right ear. No dizziness or feeling off balance. She has been using the 5 drops of debrox BID. Advised to continue drops for now and to let me know how she is doing on Friday, 9/20. Suspect large cerumen burden as cause of decreased hearing. Could consider ENT referral for removal of wax if persistent. Advised to call me sooner should symptoms worsen or she become dizzy/off balance.

## 2022-11-25 ENCOUNTER — Encounter: Payer: Self-pay | Admitting: Family Medicine

## 2022-11-25 NOTE — Addendum Note (Signed)
Addended by: Evette Georges B on: 11/25/2022 05:59 PM   Modules accepted: Orders

## 2022-11-25 NOTE — Telephone Encounter (Signed)
Called patient back to discuss her ear concerns. She has not noticed much change. She is able to hear me slightly when talking with me on the phone with her L ear. She has been using the debrox drops for 4 days now but does not notice any wax coming out with the drops after lying for 10 minutes. She denies dizziness or being off balance. I informed her that she will need ENT to help remove the wax, as the blockage is likely the cause. I am still reassured by lack of symptoms of inner ear dysfunction, lack pain during cleaning to indicate TM perforation, and the fact she can still hear a little out of the L ear. Advised to call back if she does not hear from ENT. Also discussed continuing debrox drops for 10 total days as Dr. Yetta Barre had previously recommended. Patient understanding and very thankful for the call.

## 2022-11-25 NOTE — Telephone Encounter (Signed)
Patient LVM on nurse line regarding continued issues with left ear.   Attempted to call her back. She did not answer, LVM for patient to return call to discuss matter further.  Veronda Prude, RN

## 2022-11-28 ENCOUNTER — Ambulatory Visit: Payer: Medicare HMO | Admitting: Family Medicine

## 2022-11-30 ENCOUNTER — Other Ambulatory Visit: Payer: Self-pay | Admitting: Family Medicine

## 2022-11-30 NOTE — Telephone Encounter (Signed)
Patient LVM on nurse line regarding referral. She states that she needs this to be done as soon as possible.   I attempted to call her to provide her with referral information, she did not answer. Left her a VM asking that she call back to office.   See below for referral info:  [facility], [address] [place] [address], [place] [phone]  Veronda Prude, RN

## 2022-12-01 DIAGNOSIS — H6123 Impacted cerumen, bilateral: Secondary | ICD-10-CM | POA: Diagnosis not present

## 2022-12-06 ENCOUNTER — Other Ambulatory Visit: Payer: Self-pay | Admitting: Student

## 2022-12-08 ENCOUNTER — Encounter: Payer: Self-pay | Admitting: Family Medicine

## 2022-12-09 ENCOUNTER — Ambulatory Visit: Payer: Medicare HMO | Admitting: Student

## 2022-12-20 ENCOUNTER — Ambulatory Visit: Payer: Medicare HMO | Admitting: Family Medicine

## 2022-12-20 ENCOUNTER — Encounter: Payer: Self-pay | Admitting: Family Medicine

## 2022-12-20 ENCOUNTER — Ambulatory Visit: Payer: Medicare HMO | Admitting: Physical Therapy

## 2022-12-20 VITALS — BP 122/74 | HR 79 | Ht 63.0 in | Wt 177.8 lb

## 2022-12-20 DIAGNOSIS — M1712 Unilateral primary osteoarthritis, left knee: Secondary | ICD-10-CM

## 2022-12-20 DIAGNOSIS — I10 Essential (primary) hypertension: Secondary | ICD-10-CM | POA: Diagnosis not present

## 2022-12-20 DIAGNOSIS — H6122 Impacted cerumen, left ear: Secondary | ICD-10-CM

## 2022-12-20 MED ORDER — ZOSTER VAC RECOMB ADJUVANTED 50 MCG/0.5ML IM SUSR: 0.5000 mL | Freq: Once | INTRAMUSCULAR | 0 refills | Status: AC

## 2022-12-20 NOTE — Assessment & Plan Note (Signed)
Discussed continued options for left knee osteoarthritis, and patient elected to trial physical therapy.  Unfortunate unable to have connected with physical therapy to date.  Will reach out to referral coordinator.  Offered injections in which patient wants to postpone for now.  Discussed ultimate cure would be surgery with orthopedics which we can discuss in the future as desired.

## 2022-12-20 NOTE — Patient Instructions (Signed)
I will refer you to physical therapy for your knee. Let me know if you do not hear back from them in 2 weeks. In the meantime, continue using tylenol daily, voltaren gel, other topical ointments over the counter, and heating pad (when the ointments are not on your knee), and continue to walk as much as able. If this pain gets much worse before you hear from the PT, let me know, especially if you start having fevers or the knee gets more swollen and red.

## 2022-12-20 NOTE — Assessment & Plan Note (Signed)
Resolved status post ENT.  Appreciate assistance.

## 2022-12-20 NOTE — Progress Notes (Signed)
    SUBJECTIVE:   CHIEF COMPLAINT / HPI:   Knee and leg pain, left greater than right Pain is persistent with conservative OTC therapies. She is weary of PT and injections at this time given the pain. She wonders about orthopedic referral for further evaluation.  Cerumen impaction Removed under microscopy with suction at ENT office. Hearing issue and feeling of fullness resolved.  PERTINENT  PMH / PSH: T2DM controlled with A1c 6  OBJECTIVE:   BP 122/74   Pulse 79   Ht 5\' 3"  (1.6 m)   Wt 177 lb 12.8 oz (80.6 kg)   SpO2 100%   BMI 31.50 kg/m   General: Alert and oriented, in NAD Skin: Warm, dry, and intact without lesions HEENT: NCAT, EOM grossly normal, midline nasal septum Cardiac: Regular rate Respiratory: Breathing and speaking comfortably on RA Extremities: L knee with mild swelling vs slightly more adipose tissue compared to R knee, pain with palpation diffusely over the knee, no excessive warmth, crepitus felt in L and R knees, pain with extension and flexion of R knee with pain to valgus>varus stress Neurological: No gross focal deficit Psychiatric: Appropriate mood and affect   ASSESSMENT/PLAN:   Hypertension Controlled on losartan 25 daily.  Impacted cerumen of left ear Resolved status post ENT.  Appreciate assistance.  Osteoarthritis of left knee Discussed continued options for left knee osteoarthritis, and patient elected to trial physical therapy.  Unfortunate unable to have connected with physical therapy to date.  Will reach out to referral coordinator.  Offered injections in which patient wants to postpone for now.  Discussed ultimate cure would be surgery with orthopedics which we can discuss in the future as desired.   Health maintenance Shingrix Rx given. Tdap not available to get in office today. COVID-19 vaccine postponed due to patient wanting it another time.  Janeal Holmes, MD [facility]

## 2022-12-20 NOTE — Assessment & Plan Note (Signed)
Controlled on losartan 25 daily.

## 2022-12-20 NOTE — Assessment & Plan Note (Signed)
>>  ASSESSMENT AND PLAN FOR OSTEOARTHRITIS OF LEFT KNEE WRITTEN ON 12/20/2022  4:53 PM BY Damascus Feldpausch, MD  Discussed continued options for left knee osteoarthritis, and patient elected to trial physical therapy.  Unfortunate unable to have connected with physical therapy to date.  Will reach out to referral coordinator.  Offered injections in which patient wants to postpone for now.  Discussed ultimate cure would be surgery with orthopedics which we can discuss in the future as desired.

## 2023-01-05 ENCOUNTER — Ambulatory Visit: Payer: Medicare HMO | Admitting: Podiatry

## 2023-01-06 ENCOUNTER — Other Ambulatory Visit: Payer: Self-pay

## 2023-01-06 ENCOUNTER — Ambulatory Visit: Payer: Medicare HMO | Attending: Family Medicine

## 2023-01-06 DIAGNOSIS — M1712 Unilateral primary osteoarthritis, left knee: Secondary | ICD-10-CM | POA: Diagnosis not present

## 2023-01-06 DIAGNOSIS — M25562 Pain in left knee: Secondary | ICD-10-CM | POA: Insufficient documentation

## 2023-01-06 DIAGNOSIS — M25561 Pain in right knee: Secondary | ICD-10-CM | POA: Diagnosis not present

## 2023-01-06 DIAGNOSIS — G8929 Other chronic pain: Secondary | ICD-10-CM | POA: Diagnosis not present

## 2023-01-06 MED ORDER — LOSARTAN POTASSIUM 25 MG PO TABS: 25.0000 mg | ORAL_TABLET | Freq: Every day | ORAL | 2 refills | Status: DC

## 2023-01-06 NOTE — Therapy (Signed)
OUTPATIENT PHYSICAL THERAPY LOWER EXTREMITY EVALUATION   Patient Name: Linda Arnold MRN: [id number] DOB:1951/02/08, 72 y.o., female Today's Date: 01/07/2023  END OF SESSION:  PT End of Session - 01/06/23 1313     Visit Number 1    Number of Visits 13    Date for PT Re-Evaluation 02/18/23    Authorization Type [employer] MCR    Progress Note Due on Visit 10    PT Start Time 1305    PT Stop Time 1345    PT Time Calculation (min) 40 min    Behavior During Therapy [facility] for tasks assessed/performed             Past Medical History:  Diagnosis Date   Arthritis 1999   knees   Diabetes mellitus without complication (HCC) 2005   Dysphagia    Hyperlipidemia    Hypertension    Memory loss    Past Surgical History:  Procedure Laterality Date   CESAREAN SECTION     TUBAL LIGATION     Patient Active Problem List   Diagnosis Date Noted   Impacted cerumen of left ear 11/22/2022   Bladder dysfunction 08/18/2022   Osteoarthritis of left knee 07/26/2022   Acute midline thoracic back pain 06/16/2022   Interstitial cystitis 03/30/2022   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   Dysuria 03/15/2022   Mild cognitive impairment 05/20/2021   Sleep apnea 01/27/2021   Short-term memory loss 05/20/2019   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   Leucocytosis    Osteoarthritis of right knee 10/29/2012   Right knee pain 08/08/2012   Hypertension 02/06/2012   Diabetes mellitus type 2, noninsulin dependent (HCC) 12/25/2006   PNEUMONOPATHY, 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 She  remembers being told that she had arthritis in her Rt knee years ago. She states that her pain is mostly present at night and will wake her up. She also has pain with getting out of the bed, but is otherwise unclear on what activities consistently aggravate her symptoms. She feels somewhat better after she moves around in the morning.   PERTINENT HISTORY: PMHx includes Knee OA, Diabetes, HTN, mild cognitive impairment and transient global amnesia    PAIN:  Are you having pain? Yes: NPRS scale: 7-10/10 Pain location: L>R knee and distal LE Pain description: aching, sharp/shooting (around patella)  Aggravating factors: unclear  Relieving factors: not sure   PRECAUTIONS: None  RED FLAGS: None   WEIGHT BEARING RESTRICTIONS: No  FALLS:  Has patient fallen in last 6 months? No  LIVING ENVIRONMENT: Lives with: lives alone Lives in: House/apartment Stairs: No Has following equipment at home: shower chair  OCCUPATION: retired   PLOF: Independent and Leisure: Warehouse manager, shopping, bingo, reading, Church  PATIENT GOALS: Patient would like to have less pain and return to walking 30 minutes.   NEXT MD VISIT: Not currently scheduled to f/u with referring provider, nor PCP   OBJECTIVE:  Note: Objective measures were completed at Evaluation unless otherwise noted.  DIAGNOSTIC FINDINGS:   08/19/22 Left Knee Pain X-RAY  Mild spurring of the patellofemoral,  lateral, and medial compartments.   IMPRESSION: Mild tricompartmental left knee osteoarthrosis.  PATIENT SURVEYS:  FOTO 62 current, 64 predicted   COGNITION: Overall cognitive status: Within functional limits for tasks assessed      PALPATION: Mild tenderness to palpation noted with medium pressure to supra-patellar area BIL  LOWER EXTREMITY MMT:  MMT Right eval Left eval  Hip flexion 4+ 4+  Hip extension    Hip abduction 5 5  Hip adduction 5 5  Hip internal rotation 4 4-  Hip external rotation 4 4  Knee  flexion 4 4  Knee extension 4 4  Ankle dorsiflexion 5 4+  Ankle plantarflexion    Ankle inversion    Ankle eversion      FUNCTIONAL TESTS:  30 seconds chair stand test: to be assessed   GAIT: Distance walked: 100 ft Assistive device utilized: None Level of assistance: Complete Independence Comments: decreased TKE bilaterally, mildly forward flexed posture    TODAY'S TREATMENT:                                                                                                                               [facility] Adult PT Treatment:                                                DATE: 01/06/2023   Initial evaluation: see patient education and home exercise program as noted below     PATIENT EDUCATION:  Education details: reviewed initial home exercise program; discussion of POC, prognosis and goals for skilled PT   Person educated: Patient Education method: Explanation, Demonstration, and Handouts Education comprehension: verbalized understanding, returned demonstration, and needs further education  HOME EXERCISE PROGRAM: To be provided at f/u visit   ASSESSMENT:  CLINICAL IMPRESSION: [name] is a 72 y.o. female  who was seen today for physical therapy evaluation and treatment for BIL knee pain, with history of mild knee OA, bilaterally. She is demonstrating decreased BIL LE MMT, decreased TKE during terminal stance bilaterally, and requires UE support to perform STS. She has related pain and difficulty with prolonged standing, prolonged walking, and bed mobility tasks. She requires skilled PT services at this time to address relevant deficits and improve overall function.     OBJECTIVE IMPAIRMENTS: decreased activity tolerance, decreased endurance, decreased mobility, difficulty walking, decreased strength, and pain.   ACTIVITY LIMITATIONS: carrying, lifting, standing, squatting, stairs, transfers, bed mobility, and locomotion level  PARTICIPATION LIMITATIONS: cleaning, driving,  shopping, and community activity  PERSONAL FACTORS: Age, Time since onset of injury/illness/exacerbation, and 3+ comorbidities: PMHx includes Knee OA, Diabetes, HTN, mild cognitive impairment and transient global amnesia  are also affecting patient's functional outcome.   REHAB POTENTIAL: Fair    CLINICAL DECISION MAKING: Stable/uncomplicated  EVALUATION COMPLEXITY: Low   GOALS: Goals reviewed with patient? Yes  SHORT TERM GOALS: Target date: 01/28/2023   Patient will be independent with initial home program for LE strengthening.  Baseline: to be provided at next visit  Goal status: INITIAL   LONG TERM GOALS: Target date: 02/18/2023   Patient will report improved overall functional ability with FOTO score of 67 or greater  Baseline: 62 Goal status: INITIAL  2.  Patient will demonstrate at least 4+/5 MMT with BIL LE strength testing.  Baseline:  MMT Right eval Left eval  Hip flexion 4+ 4+  Hip abduction 5 5  Hip adduction 5 5  Hip internal rotation 4 4-  Hip external rotation 4 4  Knee flexion 4 4  Knee extension 4 4  Ankle dorsiflexion 5 4+   Goal status: INITIAL  3.  Patient will demonstrate ability to perform at least 10 STS in 30 seconds.  Baseline: to be assessed at f/u visit Goal status: INITIAL  4.  Patient will report ability to resume community walking for at least 30 minutes/day.  Baseline: limited d/t pain and decreased endurance.  Goal status: INITIAL  5.  Patient will report 7/10 worst knee pain, with exception of sleeping at night.  Baseline: 10/10 worst pain .  Goal status: INITIAL    PLAN:  PT FREQUENCY: 1-2x/week  PT DURATION: 6 weeks  PLANNED INTERVENTIONS: 97164- PT Re-evaluation, 97110-Therapeutic exercises, 97530- Therapeutic activity, 97112- Neuromuscular re-education, 97535- Self Care, 32202- Manual therapy, 97014- Electrical stimulation (unattended), Patient/Family education, Taping, Dry Needling, Joint mobilization, Cryotherapy,  and Moist heat  PLAN FOR NEXT SESSION: LE strengthening, TKE, assess 30 sec chair stand  Referring diagnosis? Primary osteoarthritis of left knee [M17.12]  Treatment diagnosis? (if different than referring diagnosis) Chronic pain of both knees What was this (referring dx) caused by? []  Surgery []  Fall [x]  Ongoing issue [x]  Arthritis []  Other: ____________  Laterality: []  Rt []  Lt [x]  Both  Check all possible CPT codes:  *CHOOSE 10 OR LESS*    See Planned Interventions listed in the Plan section of the Evaluation.     Mauri Reading, PT, DPT   01/07/2023, 12:52 PM

## 2023-01-12 ENCOUNTER — Encounter: Payer: Self-pay | Admitting: Physical Therapy

## 2023-01-12 ENCOUNTER — Ambulatory Visit: Payer: Medicare HMO | Admitting: Physical Therapy

## 2023-01-12 DIAGNOSIS — G8929 Other chronic pain: Secondary | ICD-10-CM

## 2023-01-12 DIAGNOSIS — M25561 Pain in right knee: Secondary | ICD-10-CM | POA: Diagnosis not present

## 2023-01-12 DIAGNOSIS — M1712 Unilateral primary osteoarthritis, left knee: Secondary | ICD-10-CM | POA: Diagnosis not present

## 2023-01-12 DIAGNOSIS — M25562 Pain in left knee: Secondary | ICD-10-CM | POA: Diagnosis not present

## 2023-01-12 NOTE — Therapy (Signed)
Daily Note   Patient Name: Linda Arnold MRN: [id number] DOB:[birth year], 72 y.o., female Today's Date: 01/12/2023  END OF SESSION:  PT End of Session - 01/12/23 1052     Visit Number 2    Number of Visits 13    Date for PT Re-Evaluation 02/18/23    Authorization Type [employer] MCR    Progress Note Due on Visit 10    PT Start Time 1048    PT Stop Time 1128    PT Time Calculation (min) 40 min    Behavior During Therapy [facility] for tasks assessed/performed             Past Medical History:  Diagnosis Date   Arthritis 1999   knees   Diabetes mellitus without complication (HCC) 2005   Dysphagia    Hyperlipidemia    Hypertension    Memory loss    Past Surgical History:  Procedure Laterality Date   CESAREAN SECTION     TUBAL LIGATION     Patient Active Problem List   Diagnosis Date Noted   Impacted cerumen of left ear 11/22/2022   Bladder dysfunction 08/18/2022   Osteoarthritis of left knee 07/26/2022   Acute midline thoracic back pain 06/16/2022   Interstitial cystitis 03/30/2022   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   Dysuria 03/15/2022   Mild cognitive impairment 05/20/2021   Sleep apnea 01/27/2021   Short-term memory loss 05/20/2019   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   Leucocytosis    Osteoarthritis of right knee 10/29/2012   Right knee pain 08/08/2012   Hypertension 02/06/2012   Diabetes mellitus type 2, noninsulin dependent (HCC) 12/25/2006   PNEUMONOPATHY, 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  She rates the pain 6/10 currently but was a 10/10  this morning when she woke up.  PERTINENT HISTORY: PMHx includes Knee OA, Diabetes, HTN, mild cognitive impairment and transient global amnesia    PAIN:  Are you having pain? Yes: NPRS scale: 6-10/10 Pain location: L>R knee and distal LE Pain description: aching, sharp/shooting (around patella)  Aggravating factors: unclear  Relieving factors: not sure   PRECAUTIONS: None  RED FLAGS: None   WEIGHT BEARING RESTRICTIONS: No  FALLS:  Has patient fallen in last 6 months? No  LIVING ENVIRONMENT: Lives with: lives alone Lives in: House/apartment Stairs: No Has following equipment at home: shower chair  OCCUPATION: retired   PLOF: Independent and Leisure: Warehouse manager, shopping, bingo, reading, Church  PATIENT GOALS: Patient would like to have less pain and return to walking 30 minutes.   NEXT MD VISIT: Not currently scheduled to f/u with referring provider, nor PCP   OBJECTIVE:  Note: Objective measures were completed at Evaluation unless otherwise noted.  DIAGNOSTIC FINDINGS:   08/19/22 Left Knee Pain X-RAY  Mild spurring of the patellofemoral, lateral, and medial compartments.   IMPRESSION: Mild tricompartmental left knee osteoarthrosis.  PATIENT SURVEYS:  FOTO 62 current, 64 predicted   COGNITION: Overall cognitive status: Within functional limits for tasks assessed      PALPATION: Mild tenderness to palpation noted with medium pressure to supra-patellar area  BIL  LOWER EXTREMITY MMT:  MMT Right eval Left eval  Hip flexion 4+ 4+  Hip extension    Hip abduction 5 5  Hip adduction 5 5  Hip internal rotation 4 4-  Hip external rotation 4 4  Knee flexion 4 4  Knee extension 4 4  Ankle dorsiflexion 5 4+  Ankle plantarflexion    Ankle inversion    Ankle eversion      FUNCTIONAL TESTS:  30 seconds chair stand test: to be assessed   GAIT: Distance walked: 100 ft Assistive device utilized: None Level of assistance: Complete  Independence Comments: decreased TKE bilaterally, mildly forward flexed posture    TODAY'S TREATMENT:                                                                                                                               [facility] Adult PT Treatment:                                                DATE: 01/12/2023  nu-step L5 74m while taking subjective and planning session with patient SAQ - 3x10 ea SLR - 2x10 Supine clam - GTB - 3x10 Hip adduction squeeze - 5'' 2x10 Alternating supine march with GTB - 2x10 Supine bridge - 2x10  HOME EXERCISE PROGRAM: Access Code: [email or web address] URL: [email or web address] Date: 01/12/2023 Prepared by: Alphonzo Severance  Exercises - Supine Short Arc Quad  - 1 x daily - 7 x weekly - 3 sets - 10 reps - Small Range Straight Leg Raise  - 1 x daily - 7 x weekly - 3 sets - 10 reps - Hooklying Clamshell with Resistance  - 1 x daily - 7 x weekly - 3 sets - 10 reps - Supine Hip Adduction Isometric with Ball  - 1 x daily - 7 x weekly - 2 sets - 10 reps - 10'' hold - Supine Bridge  - 1 x daily - 7 x weekly - 3 sets - 10 reps - 2-3 seconds hold  ASSESSMENT:  CLINICAL IMPRESSION: Hayly tolerated session well with no adverse reaction.  Concentrated on basic hip and knee ext strengthening and establishing HEP.  Pt with moderate transient pain with SAQ.  OBJECTIVE IMPAIRMENTS: decreased activity tolerance, decreased endurance, decreased mobility, difficulty walking, decreased strength, and pain.   ACTIVITY LIMITATIONS: carrying, lifting, standing, squatting, stairs, transfers, bed mobility, and locomotion level  PARTICIPATION LIMITATIONS: cleaning, driving, shopping, and community activity  PERSONAL FACTORS: Age, Time since onset of injury/illness/exacerbation, and 3+ comorbidities: PMHx includes Knee OA, Diabetes, HTN, mild cognitive impairment and transient global amnesia  are also affecting patient's functional outcome.   REHAB POTENTIAL: Fair     CLINICAL DECISION MAKING: Stable/uncomplicated  EVALUATION COMPLEXITY: Low   GOALS: Goals reviewed with patient? Yes  SHORT TERM GOALS: Target date: 01/28/2023  Patient will be independent with initial home program for LE strengthening.  Baseline: to be provided at next visit  Goal status: INITIAL   LONG TERM GOALS: Target date: 02/18/2023   Patient will report improved overall functional ability with FOTO score of 67 or greater  Baseline: 62 Goal status: INITIAL  2.  Patient will demonstrate at least 4+/5 MMT with BIL LE strength testing.  Baseline:  MMT Right eval Left eval  Hip flexion 4+ 4+  Hip abduction 5 5  Hip adduction 5 5  Hip internal rotation 4 4-  Hip external rotation 4 4  Knee flexion 4 4  Knee extension 4 4  Ankle dorsiflexion 5 4+   Goal status: INITIAL  3.  Patient will demonstrate ability to perform at least 10 STS in 30 seconds.  Baseline: to be assessed at f/u visit Goal status: INITIAL  4.  Patient will report ability to resume community walking for at least 30 minutes/day.  Baseline: limited d/t pain and decreased endurance.  Goal status: INITIAL  5.  Patient will report 7/10 worst knee pain, with exception of sleeping at night.  Baseline: 10/10 worst pain .  Goal status: INITIAL    PLAN:  PT FREQUENCY: 1-2x/week  PT DURATION: 6 weeks  PLANNED INTERVENTIONS: 97164- PT Re-evaluation, 97110-Therapeutic exercises, 97530- Therapeutic activity, 97112- Neuromuscular re-education, 97535- Self Care, 72536- Manual therapy, 97014- Electrical stimulation (unattended), Patient/Family education, Taping, Dry Needling, Joint mobilization, Cryotherapy, and Moist heat  PLAN FOR NEXT SESSION: LE strengthening, TKE, assess 30 sec chair stand  Referring diagnosis? Primary osteoarthritis of left knee [M17.12]  Treatment diagnosis? (if different than referring diagnosis) Chronic pain of both knees What was this (referring dx) caused by? []   Surgery []  Fall [x]  Ongoing issue [x]  Arthritis []  Other: ____________  Laterality: []  Rt []  Lt [x]  Both  Check all possible CPT codes:  *CHOOSE 10 OR LESS*    See Planned Interventions listed in the Plan section of the Evaluation.     Kimberlee Nearing Clayson Riling PT 01/12/2023, 1:14 PM

## 2023-01-18 ENCOUNTER — Other Ambulatory Visit: Payer: Self-pay | Admitting: Family Medicine

## 2023-01-18 DIAGNOSIS — Z1231 Encounter for screening mammogram for malignant neoplasm of breast: Secondary | ICD-10-CM

## 2023-01-19 ENCOUNTER — Ambulatory Visit: Payer: Medicare HMO

## 2023-01-19 DIAGNOSIS — M1712 Unilateral primary osteoarthritis, left knee: Secondary | ICD-10-CM | POA: Diagnosis not present

## 2023-01-19 DIAGNOSIS — M25561 Pain in right knee: Secondary | ICD-10-CM | POA: Diagnosis not present

## 2023-01-19 DIAGNOSIS — G8929 Other chronic pain: Secondary | ICD-10-CM

## 2023-01-19 DIAGNOSIS — M25562 Pain in left knee: Secondary | ICD-10-CM | POA: Diagnosis not present

## 2023-01-19 NOTE — Therapy (Signed)
Daily Note   Patient Name: Linda Arnold MRN: [id number] DOB:[birth year], 72 y.o., female Today's Date: 01/19/2023  END OF SESSION:  PT End of Session - 01/19/23 1444     Visit Number 3    Number of Visits 13    Date for PT Re-Evaluation 02/18/23    Authorization Type [employer] MCR    PT Start Time 1445    PT Stop Time 1523    PT Time Calculation (min) 38 min    Behavior During Therapy [facility] for tasks assessed/performed              Past Medical History:  Diagnosis Date   Arthritis 1999   knees   Diabetes mellitus without complication (HCC) 2005   Dysphagia    Hyperlipidemia    Hypertension    Memory loss    Past Surgical History:  Procedure Laterality Date   CESAREAN SECTION     TUBAL LIGATION     Patient Active Problem List   Diagnosis Date Noted   Impacted cerumen of left ear 11/22/2022   Bladder dysfunction 08/18/2022   Osteoarthritis of left knee 07/26/2022   Acute midline thoracic back pain 06/16/2022   Interstitial cystitis 03/30/2022   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   Dysuria 03/15/2022   Mild cognitive impairment 05/20/2021   Sleep apnea 01/27/2021   Short-term memory loss 05/20/2019   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   Leucocytosis    Osteoarthritis of right knee 10/29/2012   Right knee pain 08/08/2012   Hypertension 02/06/2012   Diabetes mellitus type 2, noninsulin dependent (HCC) 12/25/2006   PNEUMONOPATHY, 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 She does state that she has been doing her HEP 2x/day.   PERTINENT HISTORY: PMHx  includes Knee OA, Diabetes, HTN, mild cognitive impairment and transient global amnesia    PAIN:  Are you having pain? Yes: NPRS scale: 6-10/10 Pain location: L>R knee and distal LE Pain description: aching, sharp/shooting (around patella)  Aggravating factors: unclear  Relieving factors: not sure   PRECAUTIONS: None  RED FLAGS: None   WEIGHT BEARING RESTRICTIONS: No  FALLS:  Has patient fallen in last 6 months? No  LIVING ENVIRONMENT: Lives with: lives alone Lives in: House/apartment Stairs: No Has following equipment at home: shower chair  OCCUPATION: retired   PLOF: Independent and Leisure: Warehouse manager, shopping, bingo, reading, Church  PATIENT GOALS: Patient would like to have less pain and return to walking 30 minutes.   NEXT MD VISIT: Not currently scheduled to f/u with referring provider, nor PCP   OBJECTIVE:  Note: Objective measures were completed at Evaluation unless otherwise noted.  DIAGNOSTIC FINDINGS:   08/19/22 Left Knee Pain X-RAY  Mild spurring of the patellofemoral, lateral, and medial compartments.   IMPRESSION: Mild tricompartmental left knee osteoarthrosis.  PATIENT SURVEYS:  FOTO 62 current, 64 predicted   COGNITION: Overall cognitive status: Within functional limits for tasks assessed      PALPATION: Mild tenderness to palpation noted with medium pressure to supra-patellar area BIL  LOWER EXTREMITY MMT:  MMT Right eval Left eval  Hip flexion 4+ 4+  Hip extension    Hip abduction 5 5  Hip adduction 5 5  Hip internal rotation 4 4-  Hip external rotation 4 4  Knee flexion 4 4  Knee extension 4 4  Ankle dorsiflexion 5 4+  Ankle plantarflexion    Ankle inversion    Ankle eversion      FUNCTIONAL TESTS:  30 seconds chair stand test: to be assessed   GAIT: Distance walked: 100 ft Assistive device utilized: None Level of assistance: Complete Independence Comments: decreased TKE bilaterally, mildly forward flexed  posture    TODAY'S TREATMENT:                                                                                                                               [facility] Adult PT Treatment:                                                DATE: 01/19/2023  Therapeutic Exercise:  nu-step L5 72m  Supine bridge - 2x10 SAQ - 2 x 15 ea SLR - 2x10 ea Hip adduction balll squeeze - 5'' 2x10 Alternating supine march with GTB - 2x10 S/L clam - GTB - 2x10  [facility] Adult PT Treatment:                                                DATE: 01/12/2023  nu-step L5 34m while taking subjective and planning session with patient SAQ - 3x10 ea SLR - 2x10 Supine clam - GTB - 3x10 Hip adduction squeeze - 5'' 2x10 Alternating supine march with GTB - 2x10 Supine bridge - 2x10  HOME EXERCISE PROGRAM: Access Code: [email or web address] URL: [email or web address] Date: 01/12/2023 Prepared by: Alphonzo Severance  Exercises - Supine Short Arc Quad  - 1 x daily - 7 x weekly - 3 sets - 10 reps - Small Range Straight Leg Raise  - 1 x daily - 7 x weekly - 3 sets - 10 reps - Hooklying Clamshell with Resistance  - 1 x daily - 7 x weekly - 3 sets - 10 reps - Supine Hip Adduction Isometric with Ball  - 1 x daily - 7 x weekly - 2 sets - 10 reps - 10'' hold - Supine Bridge  - 1 x daily - 7 x weekly - 3 sets - 10 reps - 2-3 seconds hold  ASSESSMENT:  CLINICAL IMPRESSION: Sherrill tolerated session well with no adverse reaction.  She has most difficulty with SAQ, on left and requires break to complete. We will continue per POC.   OBJECTIVE IMPAIRMENTS: decreased activity tolerance, decreased endurance, decreased mobility, difficulty walking, decreased strength, and pain.   ACTIVITY LIMITATIONS: carrying, lifting, standing, squatting, stairs,  transfers, bed mobility, and locomotion level  PARTICIPATION LIMITATIONS: cleaning, driving, shopping, and community activity  PERSONAL FACTORS: Age, Time since onset of  injury/illness/exacerbation, and 3+ comorbidities: PMHx includes Knee OA, Diabetes, HTN, mild cognitive impairment and transient global amnesia  are also affecting patient's functional outcome.   REHAB POTENTIAL: Fair    CLINICAL DECISION MAKING: Stable/uncomplicated  EVALUATION COMPLEXITY: Low   GOALS: Goals reviewed with patient? Yes  SHORT TERM GOALS: Target date: 01/28/2023   Patient will be independent with initial home program for LE strengthening.  Baseline: to be provided at next visit  Goal status: INITIAL   LONG TERM GOALS: Target date: 02/18/2023   Patient will report improved overall functional ability with FOTO score of 67 or greater  Baseline: 62 Goal status: INITIAL  2.  Patient will demonstrate at least 4+/5 MMT with BIL LE strength testing.  Baseline:  MMT Right eval Left eval  Hip flexion 4+ 4+  Hip abduction 5 5  Hip adduction 5 5  Hip internal rotation 4 4-  Hip external rotation 4 4  Knee flexion 4 4  Knee extension 4 4  Ankle dorsiflexion 5 4+   Goal status: INITIAL  3.  Patient will demonstrate ability to perform at least 10 STS in 30 seconds.  Baseline: to be assessed at f/u visit Goal status: INITIAL  4.  Patient will report ability to resume community walking for at least 30 minutes/day.  Baseline: limited d/t pain and decreased endurance.  Goal status: INITIAL  5.  Patient will report 7/10 worst knee pain, with exception of sleeping at night.  Baseline: 10/10 worst pain .  Goal status: INITIAL    PLAN:  PT FREQUENCY: 1-2x/week  PT DURATION: 6 weeks  PLANNED INTERVENTIONS: 97164- PT Re-evaluation, 97110-Therapeutic exercises, 97530- Therapeutic activity, 97112- Neuromuscular re-education, 97535- Self Care, 16109- Manual therapy, 97014- Electrical stimulation (unattended), Patient/Family education, Taping, Dry Needling, Joint mobilization, Cryotherapy, and Moist heat  PLAN FOR NEXT SESSION: LE strengthening, TKE, assess 30 sec  chair stand  Referring diagnosis? Primary osteoarthritis of left knee [M17.12]  Treatment diagnosis? (if different than referring diagnosis) Chronic pain of both knees What was this (referring dx) caused by? []  Surgery []  Fall [x]  Ongoing issue [x]  Arthritis []  Other: ____________  Laterality: []  Rt []  Lt [x]  Both  Check all possible CPT codes:  *CHOOSE 10 OR LESS*    See Planned Interventions listed in the Plan section of the Evaluation.     Mauri Reading, PT, DPT   01/19/2023, 3:28 PM

## 2023-01-23 ENCOUNTER — Ambulatory Visit: Payer: Medicare HMO | Admitting: Podiatry

## 2023-01-23 ENCOUNTER — Encounter: Payer: Self-pay | Admitting: Podiatry

## 2023-01-23 DIAGNOSIS — B351 Tinea unguium: Secondary | ICD-10-CM | POA: Diagnosis not present

## 2023-01-23 DIAGNOSIS — M79674 Pain in right toe(s): Secondary | ICD-10-CM

## 2023-01-23 DIAGNOSIS — M79675 Pain in left toe(s): Secondary | ICD-10-CM | POA: Diagnosis not present

## 2023-01-23 DIAGNOSIS — L84 Corns and callosities: Secondary | ICD-10-CM

## 2023-01-23 DIAGNOSIS — E119 Type 2 diabetes mellitus without complications: Secondary | ICD-10-CM

## 2023-01-24 ENCOUNTER — Ambulatory Visit (HOSPITAL_COMMUNITY)
Admission: RE | Admit: 2023-01-24 | Discharge: 2023-01-24 | Disposition: A | Discharge status: Home / Self Care | Payer: Medicare HMO | Source: Ambulatory Visit | Attending: Family Medicine

## 2023-01-24 ENCOUNTER — Ambulatory Visit (INDEPENDENT_AMBULATORY_CARE_PROVIDER_SITE_OTHER): Payer: Medicare HMO | Admitting: Family Medicine

## 2023-01-24 ENCOUNTER — Encounter: Payer: Self-pay | Admitting: Family Medicine

## 2023-01-24 VITALS — BP 117/69 | HR 80 | Ht 62.0 in | Wt 176.4 lb

## 2023-01-24 DIAGNOSIS — R079 Chest pain, unspecified: Secondary | ICD-10-CM | POA: Diagnosis not present

## 2023-01-24 DIAGNOSIS — R070 Pain in throat: Secondary | ICD-10-CM | POA: Diagnosis not present

## 2023-01-24 NOTE — Progress Notes (Signed)
  Subjective:  Patient ID: Linda Arnold, female    DOB: 1950/09/22,  MRN: [id number]  Chief Complaint  Patient presents with   [facility]    RM#10 [facility] patient has no concerns at this time just here for nail care.Patient states checks her sugars daily this am 122 after late evening snack for the most part sugars are controlled.    72 y.o. female in for follow-up with the above complaint. History confirmed with patient.  No new issues since last visit.  She still using urea cream and calluses.  Nails are thickened elongated and painful again.  Intermittent debridement has been very helpful.  Her diabetes remains very well controlled  Objective:  Physical Exam: warm, good capillary refill, no trophic changes or ulcerative lesions, normal DP and PT pulses and normal sensory exam.  Onychomycosis x10 with thickened elongated discolored nails Left Foot: Hallux valgus with bunion present, semireducible hammertoes present Right Foot: Mild hallux valgus with bunion present, semireducible hammertoes present, tailor's bunion present, hyperkeratosis submet 2-3, submet 5, submet 5 is very painful    Assessment:   1. Pain due to onychomycosis of toenails of both feet   2. Callus of foot   3. Diabetes mellitus type 2, noninsulin dependent (HCC)       Plan:  Patient was evaluated and treated and all questions answered.     All symptomatic hyperkeratoses were safely debrided with a sterile #15 blade to patient's level of comfort without incident. We discussed preventative and palliative care of these lesions including supportive and accommodative shoegear, padding, prefabricated and custom molded accommodative orthoses, use of a pumice stone and lotions/creams daily.  Discussed the etiology and treatment options for the condition in detail with the patient.  Regular admit debridement has been helpful in reducing pain and improving function.  Recommended debridement of the nails today. Sharp and  mechanical debridement performed of all painful and mycotic nails today. Nails debrided in length and thickness using a nail nipper and a mechanical burr to level of comfort. Discussed treatment options including appropriate shoe gear. Follow up as needed for painful nails.    No follow-ups on file.

## 2023-01-24 NOTE — Patient Instructions (Signed)
Good to see you today - Thank you for coming in  Things we discussed today:  1) Your symptoms sound to be from strain of the muscle supporting the throat and neck.  - Start taking ibuprofen every 6 hours for the next 7 days.  After that, you can return to taking ibuprofen only as needed for pain. - Drink plenty of water to stay hydrated - Make sure your neck is supported while you sleep.  - Come back in 2 weeks if your symptoms are still not improving  Come back sooner if you: - are unable to swallow due to pain -You are getting fevers -Start throwing up -Feel like there is food stuck in your throat

## 2023-01-24 NOTE — Progress Notes (Signed)
    SUBJECTIVE:   CHIEF COMPLAINT / HPI:   LW is a 72yo F w/ hx of HTN, sleep apnea, OA that p/f throat discomfort. - Has been having sore throat for the past 9-10 days.  - Having some mild congestion. - Pain in L side of neck/throat when she tries to swallowing. - Denies cough, vomitng, fevers, sensation of food getting stuck, foul breath, heartburn.   - Also reports having chest pain since coming to clinic today. EKG was obtained.  Patient thinks that this is potentially heartburn.  OBJECTIVE:   BP 117/69   Pulse 80   Ht 5\' 2"  (1.575 m)   Wt 176 lb 6.4 oz (80 kg)   SpO2 100%   BMI 32.26 kg/m   General: Alert, pleasant woman. NAD. HEENT: NCAT. MMM.  Oropharynx clear.  No mucosal lesions, masses, ulcerations.  Tonsils appeared nonerythematous and nonedematous.  Discomfort with swallowing Neck: Tender to palpation diffusely along L anterior neck. Normal ROM of neck. No masses palpated in neck , no LAD.  No overlying skin changes. CV: RRR, no murmurs. Resp: CTAB, no wheezing or crackles. Normal WOB on RA.  Abm: Soft, nontender, nondistended. BS present. Ext: Moves all ext spontaneously Skin: Warm, well perfused   ASSESSMENT/PLAN:   Assessment & Plan Throat pain Differential includes muscle strain, infectious pharyngitis, abscess, lymphadenopathy/lymphadenitis, esophageal dysfunction/diverticulum. Given mild degree of discomfort and reproducibility of discomfort with palpation of L ant neck, suspect MSK/muscle strain. Pharyngitis is also considered although pt seems to have minimal other infectious symptoms (mild congestion).  Abscess and lymphadenitis are less likely given no palpated masses in the area.  Esophageal dysfunction or a diverticulum is less likely given no sensation of food getting stuck or vomiting. -Recommend scheduled ibuprofen every 6 hours for next 7 days, then using as needed -Advised to follow-up in 2 weeks Chest pain, unspecified type Had acute chest pain  once arriving in clinic.  EKG was obtained which showed normal sinus rhythm and no ST changes.  Vital signs stable.  Most likely heartburn related, as patient also thinks that it is heartburn related.  Low concern for cardiac etiologies.    Lincoln Brigham, MD [facility]

## 2023-01-26 ENCOUNTER — Ambulatory Visit: Payer: Medicare HMO

## 2023-01-26 DIAGNOSIS — M1712 Unilateral primary osteoarthritis, left knee: Secondary | ICD-10-CM | POA: Diagnosis not present

## 2023-01-26 DIAGNOSIS — M25562 Pain in left knee: Secondary | ICD-10-CM | POA: Diagnosis not present

## 2023-01-26 DIAGNOSIS — G8929 Other chronic pain: Secondary | ICD-10-CM

## 2023-01-26 DIAGNOSIS — M25561 Pain in right knee: Secondary | ICD-10-CM | POA: Diagnosis not present

## 2023-01-26 NOTE — Therapy (Signed)
Daily Note   Patient Name: Linda Arnold MRN: [id number] DOB:03-Nov-1950, 72 y.o., female Today's Date: 01/26/2023  END OF SESSION:  PT End of Session - 01/26/23 1402     Visit Number 4    Number of Visits 13    Date for PT Re-Evaluation 02/18/23    Authorization Type [employer] MCR    PT Start Time 1405    PT Stop Time 1445    PT Time Calculation (min) 40 min    Activity Tolerance Patient tolerated treatment well    Behavior During Therapy [facility] for tasks assessed/performed               Past Medical History:  Diagnosis Date   Arthritis 1999   knees   Diabetes mellitus without complication (HCC) 2005   Dysphagia    Hyperlipidemia    Hypertension    Memory loss    Past Surgical History:  Procedure Laterality Date   CESAREAN SECTION     TUBAL LIGATION     Patient Active Problem List   Diagnosis Date Noted   Impacted cerumen of left ear 11/22/2022   Bladder dysfunction 08/18/2022   Osteoarthritis of left knee 07/26/2022   Acute midline thoracic back pain 06/16/2022   Interstitial cystitis 03/30/2022   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   Dysuria 03/15/2022   Mild cognitive impairment 05/20/2021   Sleep apnea 01/27/2021   Short-term memory loss 05/20/2019   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     PERTINENT HISTORY: PMHx includes Knee OA, Diabetes, HTN, mild  cognitive impairment and transient global amnesia    PAIN:  Are you having pain? Yes: NPRS scale: 6-10/10 Pain location: L>R knee and distal LE Pain description: aching, sharp/shooting (around patella)  Aggravating factors: unclear  Relieving factors: not sure   PRECAUTIONS: None  RED FLAGS: None   WEIGHT BEARING RESTRICTIONS: No  FALLS:  Has patient fallen in last 6 months? No  LIVING ENVIRONMENT: Lives with: lives alone Lives in: House/apartment Stairs: No Has following equipment at home: shower chair  OCCUPATION: retired   PLOF: Independent and Leisure: Warehouse manager, shopping, bingo, reading, Church  PATIENT GOALS: Patient would like to have less pain and return to walking 30 minutes.   NEXT MD VISIT: Not currently scheduled to f/u with referring provider, nor PCP   OBJECTIVE:  Note: Objective measures were completed at Evaluation unless otherwise noted.  DIAGNOSTIC FINDINGS:   08/19/22 Left Knee Pain X-RAY  Mild spurring of the patellofemoral, lateral, and medial compartments.   IMPRESSION: Mild tricompartmental left knee osteoarthrosis.  PATIENT SURVEYS:  FOTO 62 current, 64 predicted   COGNITION: Overall cognitive status: Within functional limits for tasks assessed      PALPATION: Mild tenderness to palpation noted with medium pressure to supra-patellar area BIL  LOWER EXTREMITY MMT:  MMT Right eval Left eval  Hip flexion 4+ 4+  Hip extension  Hip abduction 5 5  Hip adduction 5 5  Hip internal rotation 4 4-  Hip external rotation 4 4  Knee flexion 4 4  Knee extension 4 4  Ankle dorsiflexion 5 4+  Ankle plantarflexion    Ankle inversion    Ankle eversion      FUNCTIONAL TESTS:  30 seconds chair stand test: to be assessed   GAIT: Distance walked: 100 ft Assistive device utilized: None Level of assistance: Complete Independence Comments: decreased TKE bilaterally, mildly forward flexed posture    TODAY'S TREATMENT:                                                                                                                                [facility] Adult PT Treatment:                                                DATE: 01/26/2023  Therapeutic Exercise:  nu-step L5 x 87m  Supine bridge - 2x10 with GTB for resisted abduction  SAQ -  x 15 ea SLR - 2x15 ea Hip adduction ball squeeze - 5'' x 20  Alternating supine march with GTB - 2x10 Supine S/L clam - GTB - 2x10  [facility] Adult PT Treatment:                                                DATE: 01/19/2023  Therapeutic Exercise:  nu-step L5 75m  Supine bridge - 2x10 SAQ - 2 x 15 ea SLR - 2x10 ea Hip adduction balll squeeze - 5'' 2x10 Alternating supine march with GTB - 2x10 S/L clam - GTB - 2x10  [facility] Adult PT Treatment:                                                DATE: 01/12/2023  nu-step L5 51m while taking subjective and planning session with patient SAQ - 3x10 ea SLR - 2x10 Supine clam - GTB - 3x10 Hip adduction squeeze - 5'' 2x10 Alternating supine march with GTB - 2x10 Supine bridge - 2x10  HOME EXERCISE PROGRAM: Access Code: [email or web address] URL: [email or web address] Date: 01/12/2023 Prepared by: Alphonzo Severance  Exercises - Supine Short Arc Quad  - 1 x daily - 7 x weekly - 3 sets - 10 reps - Small Range Straight Leg Raise  - 1 x daily - 7 x weekly - 3 sets - 10 reps - Hooklying Clamshell with Resistance  - 1 x daily - 7 x weekly - 3 sets - 10 reps - Supine Hip Adduction  Isometric with Ball  - 1 x daily - 7 x weekly - 2 sets - 10 reps - 10'' hold - Supine Bridge  - 1 x daily - 7 x weekly - 3 sets - 10 reps - 2-3 seconds hold  ASSESSMENT:  CLINICAL IMPRESSION: Patient continues to demonstrate improved tolerance of LE strengthening activities. She had some difficulty, but denies any worsening of symptoms at end of session. We will continue per POC.    OBJECTIVE IMPAIRMENTS: decreased activity tolerance, decreased endurance, decreased  mobility, difficulty walking, decreased strength, and pain.   ACTIVITY LIMITATIONS: carrying, lifting, standing, squatting, stairs, transfers, bed mobility, and locomotion level  PARTICIPATION LIMITATIONS: cleaning, driving, shopping, and community activity  PERSONAL FACTORS: Age, Time since onset of injury/illness/exacerbation, and 3+ comorbidities: PMHx includes Knee OA, Diabetes, HTN, mild cognitive impairment and transient global amnesia  are also affecting patient's functional outcome.   REHAB POTENTIAL: Fair    CLINICAL DECISION MAKING: Stable/uncomplicated  EVALUATION COMPLEXITY: Low   GOALS: Goals reviewed with patient? Yes  SHORT TERM GOALS: Target date: 01/28/2023   Patient will be independent with initial home program for LE strengthening.  Baseline: to be provided at next visit  Goal status: INITIAL   LONG TERM GOALS: Target date: 02/18/2023   Patient will report improved overall functional ability with FOTO score of 67 or greater  Baseline: 62 Goal status: INITIAL  2.  Patient will demonstrate at least 4+/5 MMT with BIL LE strength testing.  Baseline:  MMT Right eval Left eval  Hip flexion 4+ 4+  Hip abduction 5 5  Hip adduction 5 5  Hip internal rotation 4 4-  Hip external rotation 4 4  Knee flexion 4 4  Knee extension 4 4  Ankle dorsiflexion 5 4+   Goal status: INITIAL  3.  Patient will demonstrate ability to perform at least 10 STS in 30 seconds.  Baseline: to be assessed at f/u visit Goal status: INITIAL  4.  Patient will report ability to resume community walking for at least 30 minutes/day.  Baseline: limited d/t pain and decreased endurance.  Goal status: INITIAL  5.  Patient will report 7/10 worst knee pain, with exception of sleeping at night.  Baseline: 10/10 worst pain  01/26/23: 5-6/10 Goal status: INITIAL    PLAN:  PT FREQUENCY: 1-2x/week  PT DURATION: 6 weeks  PLANNED INTERVENTIONS: 97164- PT Re-evaluation,  97110-Therapeutic exercises, 97530- Therapeutic activity, 97112- Neuromuscular re-education, 97535- Self Care, [id number]- Manual therapy, 97014- Electrical stimulation (unattended), Patient/Family education, Taping, Dry Needling, Joint mobilization, Cryotherapy, and Moist heat  PLAN FOR NEXT SESSION: LE strengthening, TKE, assess 30 sec chair stand  Referring diagnosis? Primary osteoarthritis of left knee [M17.12]  Treatment diagnosis? (if different than referring diagnosis) Chronic pain of both knees What was this (referring dx) caused by? []  Surgery []  Fall [x]  Ongoing issue [x]  Arthritis []  Other: ____________  Laterality: []  Rt []  Lt [x]  Both  Check all possible CPT codes:  *CHOOSE 10 OR LESS*    See Planned Interventions listed in the Plan section of the Evaluation.     Mauri Reading, PT, DPT   01/26/2023, 4:15 PM

## 2023-02-01 ENCOUNTER — Ambulatory Visit: Payer: Medicare HMO

## 2023-02-01 DIAGNOSIS — M25562 Pain in left knee: Secondary | ICD-10-CM | POA: Diagnosis not present

## 2023-02-01 DIAGNOSIS — G8929 Other chronic pain: Secondary | ICD-10-CM | POA: Diagnosis not present

## 2023-02-01 DIAGNOSIS — M1712 Unilateral primary osteoarthritis, left knee: Secondary | ICD-10-CM | POA: Diagnosis not present

## 2023-02-01 DIAGNOSIS — M25561 Pain in right knee: Secondary | ICD-10-CM | POA: Diagnosis not present

## 2023-02-01 NOTE — Therapy (Signed)
Daily Note   Patient Name: Linda Arnold MRN: [id number] DOB:06-30-1950, 72 y.o., female Today's Date: 02/01/2023  END OF SESSION:  PT End of Session - 02/01/23 1502     Visit Number 5    Number of Visits 13    Date for PT Re-Evaluation 02/18/23    Authorization Type [employer] MCR    PT Start Time 1450    PT Stop Time 1528    PT Time Calculation (min) 38 min    Activity Tolerance Patient tolerated treatment well    Behavior During Therapy WFL for tasks assessed/performed               Past Medical History:  Diagnosis Date   Arthritis 1999   knees   Diabetes mellitus without complication (HCC) 2005   Dysphagia    Hyperlipidemia    Hypertension    Memory loss    Past Surgical History:  Procedure Laterality Date   CESAREAN SECTION     TUBAL LIGATION     Patient Active Problem List   Diagnosis Date Noted   Impacted cerumen of left ear 11/22/2022   Bladder dysfunction 08/18/2022   Osteoarthritis of left knee 07/26/2022   Acute midline thoracic back pain 06/16/2022   Interstitial cystitis 03/30/2022   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   Dysuria 03/15/2022   Mild cognitive impairment 05/20/2021   Sleep apnea 01/27/2021   Short-term memory loss 05/20/2019   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   Leucocytosis    Osteoarthritis of right knee 10/29/2012   Right knee pain 08/08/2012   Hypertension 02/06/2012   Diabetes mellitus type 2, noninsulin dependent (HCC) 12/25/2006   PNEUMONOPATHY, 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    PERTINENT HISTORY: PMHx includes Knee OA, Diabetes, HTN, mild cognitive impairment and transient global amnesia    PAIN:  Are you having pain? Yes: NPRS scale: 6-10/10 Pain location: L>R knee and distal LE Pain description: aching, sharp/shooting (around patella)  Aggravating factors: unclear  Relieving factors: not sure   PRECAUTIONS: None  RED FLAGS: None   WEIGHT BEARING RESTRICTIONS: No  FALLS:  Has patient fallen in last 6 months? No  LIVING ENVIRONMENT: Lives with: lives alone Lives in: House/apartment Stairs: No Has following equipment at home: shower chair  OCCUPATION: retired   PLOF: Independent and Leisure: Warehouse manager, shopping, bingo, reading, Church  PATIENT GOALS: Patient would like to have less pain and return to walking 30 minutes.   NEXT MD VISIT: Not currently scheduled to f/u with referring provider, nor PCP   OBJECTIVE:  Note: Objective measures were completed at Evaluation unless otherwise noted.  DIAGNOSTIC FINDINGS:   08/19/22 Left Knee Pain X-RAY  Mild spurring of the patellofemoral, lateral, and medial compartments.   IMPRESSION: Mild tricompartmental left knee osteoarthrosis.  PATIENT SURVEYS:  FOTO 62 current, 64 predicted   COGNITION: Overall cognitive status: Within functional limits for tasks assessed      PALPATION: Mild tenderness to palpation noted with medium pressure to supra-patellar area BIL  LOWER EXTREMITY MMT:  MMT Right eval Left  eval  Hip flexion 4+ 4+  Hip extension    Hip abduction 5 5  Hip adduction 5 5  Hip internal rotation 4 4-  Hip external rotation 4 4  Knee flexion 4 4  Knee extension 4 4  Ankle dorsiflexion 5 4+  Ankle plantarflexion    Ankle inversion    Ankle eversion      FUNCTIONAL TESTS:  30 seconds chair stand test: to be assessed   GAIT: Distance walked: 100 ft Assistive device utilized: None Level of assistance: Complete Independence Comments: decreased  TKE bilaterally, mildly forward flexed posture    TODAY'S TREATMENT:                                                                                                                               [facility] Adult PT Treatment:                                                DATE: 02/01/2023  Therapeutic Exercise:  nu-step L5 x 78m  Supine bridge - 2x10 with GTB for resisted abduction  SAQ -  x 15 ea SLR - x15 ea Hip adduction ball squeeze - 5'' x 20  Alternating supine march with GTB - x 15 each LE Sidelying S/L clam - GTB - 2x10 Seated LAQ with 2# ankle weight, x 20 each LE   [facility] Adult PT Treatment:                                                DATE: 01/26/2023  Therapeutic Exercise:  nu-step L5 x 22m  Supine bridge - 2x10 with GTB for resisted abduction  SAQ -  x 15 ea SLR - 2x15 ea Hip adduction ball squeeze - 5'' x 20  Alternating supine march with GTB - 2x10 Supine S/L clam - GTB - 2x10  [facility] Adult PT Treatment:                                                DATE: 01/19/2023  Therapeutic Exercise:  nu-step L5 40m  Supine bridge - 2x10 SAQ - 2 x 15 ea SLR - 2x10 ea Hip adduction balll squeeze - 5'' 2x10 Alternating supine march with GTB - 2x10 S/L clam - GTB - 2x10   HOME EXERCISE PROGRAM: Access Code: [email or web address] URL: [email or web address] Date: 01/12/2023 Prepared by: Alphonzo Severance  Exercises - Supine Short Arc Quad  - 1 x daily - 7 x weekly - 3 sets - 10 reps - Small Range Straight Leg Raise  -  1 x daily - 7 x weekly - 3 sets - 10 reps - Hooklying Clamshell with Resistance  - 1 x daily - 7 x weekly - 3 sets - 10 reps - Supine Hip Adduction Isometric with Ball  - 1 x daily - 7 x weekly - 2 sets - 10 reps - 10'' hold - Supine Bridge  - 1 x daily - 7 x weekly - 3 sets - 10 reps - 2-3 seconds hold  ASSESSMENT:  CLINICAL IMPRESSION: Linda Arnold continue to tolerate gradual progression of strengthening exercises with minimal discomfort. She denies exacerbation  of symptoms at end of session. She will likely benefit from updated HEP at next visit.     OBJECTIVE IMPAIRMENTS: decreased activity tolerance, decreased endurance, decreased mobility, difficulty walking, decreased strength, and pain.   ACTIVITY LIMITATIONS: carrying, lifting, standing, squatting, stairs, transfers, bed mobility, and locomotion level  PARTICIPATION LIMITATIONS: cleaning, driving, shopping, and community activity  PERSONAL FACTORS: Age, Time since onset of injury/illness/exacerbation, and 3+ comorbidities: PMHx includes Knee OA, Diabetes, HTN, mild cognitive impairment and transient global amnesia  are also affecting patient's functional outcome.   REHAB POTENTIAL: Fair    CLINICAL DECISION MAKING: Stable/uncomplicated  EVALUATION COMPLEXITY: Low   GOALS: Goals reviewed with patient? Yes  SHORT TERM GOALS: Target date: 01/28/2023   Patient will be independent with initial home program for LE strengthening.  Baseline: to be provided at next visit  Goal status: MET; "I'm doing all the exercises, except the bridges, twice a day."    LONG TERM GOALS: Target date: 02/18/2023   Patient will report improved overall functional ability with FOTO score of 67 or greater  Baseline: 62 Goal status: INITIAL  2.  Patient will demonstrate at least 4+/5 MMT with BIL LE strength testing.  Baseline:  MMT Right eval Left eval  Hip flexion 4+ 4+  Hip abduction 5 5  Hip adduction 5 5  Hip internal rotation 4 4-  Hip external rotation 4 4  Knee flexion 4 4  Knee extension 4 4  Ankle dorsiflexion 5 4+   Goal status: INITIAL  3.  Patient will demonstrate ability to perform at least 10 STS in 30 seconds.  Baseline: to be assessed at f/u visit Goal status: INITIAL  4.  Patient will report ability to resume community walking for at least 30 minutes/day.  Baseline: limited d/t pain and decreased endurance.  Goal status: INITIAL  5.  Patient will report 7/10 worst knee  pain, with exception of sleeping at night.  Baseline: 10/10 worst pain  01/26/23: 5-6/10 Goal status: INITIAL    PLAN:  PT FREQUENCY: 1-2x/week  PT DURATION: 6 weeks  PLANNED INTERVENTIONS: 97164- PT Re-evaluation, 97110-Therapeutic exercises, 97530- Therapeutic activity, 97112- Neuromuscular re-education, 97535- Self Care, 25366- Manual therapy, 97014- Electrical stimulation (unattended), Patient/Family education, Taping, Dry Needling, Joint mobilization, Cryotherapy, and Moist heat  PLAN FOR NEXT SESSION: LE strengthening, TKE, assess 30 sec chair stand  Referring diagnosis? Primary osteoarthritis of left knee [M17.12]  Treatment diagnosis? (if different than referring diagnosis) Chronic pain of both knees What was this (referring dx) caused by? []  Surgery []  Fall [x]  Ongoing issue [x]  Arthritis []  Other: ____________  Laterality: []  Rt []  Lt [x]  Both  Check all possible CPT codes:  *CHOOSE 10 OR LESS*    See Planned Interventions listed in the Plan section of the Evaluation.     Mauri Reading, PT, DPT   02/01/2023, 4:51 PM

## 2023-02-08 ENCOUNTER — Ambulatory Visit: Payer: Medicare HMO | Attending: Family Medicine

## 2023-02-08 DIAGNOSIS — M25561 Pain in right knee: Secondary | ICD-10-CM | POA: Insufficient documentation

## 2023-02-08 DIAGNOSIS — G8929 Other chronic pain: Secondary | ICD-10-CM | POA: Diagnosis not present

## 2023-02-08 DIAGNOSIS — M25562 Pain in left knee: Secondary | ICD-10-CM | POA: Insufficient documentation

## 2023-02-08 NOTE — Therapy (Addendum)
Daily Note   Patient Name: Linda Arnold MRN: [id number] DOB:13-May-1950, 72 y.o., female Today's Date: 02/08/2023   PHYSICAL THERAPY DISCHARGE SUMMARY  Visits from Start of Care: 6  Current functional level related to goals / functional outcomes: See objective findings/assessment    Remaining deficits: See objective findings/assessment    Education / Equipment: See today's treatment/assessment      Patient agrees to discharge. Patient goals were met. Patient is being discharged due to meeting the stated rehab goals.   END OF SESSION:  PT End of Session - 02/08/23 0927     Visit Number 6    Number of Visits 13    Date for PT Re-Evaluation 02/18/23    Authorization Type [employer] MCR    PT Start Time 0915    PT Stop Time 0945    PT Time Calculation (min) 30 min    Activity Tolerance Patient tolerated treatment well    Behavior During Therapy [facility] for tasks assessed/performed                Past Medical History:  Diagnosis Date   Arthritis 1999   knees   Diabetes mellitus without complication (HCC) 2005   Dysphagia    Hyperlipidemia    Hypertension    Memory loss    Past Surgical History:  Procedure Laterality Date   CESAREAN SECTION     TUBAL LIGATION     Patient Active Problem List   Diagnosis Date Noted   Impacted cerumen of left ear 11/22/2022   Bladder dysfunction 08/18/2022   Osteoarthritis of left knee 07/26/2022   Acute midline thoracic back pain 06/16/2022   Interstitial cystitis 03/30/2022   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   Dysuria 03/15/2022   Mild cognitive impairment 05/20/2021   Sleep apnea 01/27/2021   Short-term memory loss 05/20/2019   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   Leucocytosis    Osteoarthritis of right knee 10/29/2012   Right knee pain 08/08/2012   Hypertension 02/06/2012   Diabetes mellitus type 2, noninsulin dependent (HCC) 12/25/2006   PNEUMONOPATHY, 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 However, she does notices some L>R knee swelling. She feels ready to be discharged from skilled PT today and plans to remain consistent with HEP and independent walking program.    PERTINENT HISTORY: PMHx includes Knee OA, Diabetes, HTN, mild cognitive impairment and transient global amnesia    PAIN:  Are you having pain? Yes: NPRS scale: 6-10/10 Pain location: L>R knee and distal LE Pain description: aching, sharp/shooting (around patella)  Aggravating factors: unclear  Relieving factors: not sure   PRECAUTIONS: None  RED FLAGS: None   WEIGHT BEARING RESTRICTIONS: No  FALLS:  Has patient fallen in last 6 months? No  LIVING ENVIRONMENT: Lives with: lives alone Lives in: House/apartment Stairs: No Has following equipment at home: shower chair  OCCUPATION: retired   PLOF: Independent and Leisure: Warehouse manager, shopping, bingo, reading, Church  PATIENT GOALS: Patient would like to have less pain and return to walking 30 minutes.   NEXT MD VISIT: Not currently scheduled to f/u with referring provider, nor PCP  OBJECTIVE:  Note: Objective measures were completed at Evaluation unless otherwise noted.  DIAGNOSTIC FINDINGS:   08/19/22 Left Knee Pain X-RAY  Mild spurring of the patellofemoral, lateral, and medial compartments.   IMPRESSION: Mild tricompartmental left knee osteoarthrosis.  PATIENT SURVEYS:  FOTO 62 current, 64 predicted   COGNITION: Overall cognitive status: Within functional limits for tasks assessed      PALPATION: Mild tenderness to palpation noted with medium  pressure to supra-patellar area BIL  LOWER EXTREMITY MMT:  MMT Right eval Left eval  Hip flexion 4+ 4+  Hip extension    Hip abduction 5 5  Hip adduction 5 5  Hip internal rotation 4 4-  Hip external rotation 4 4  Knee flexion 4 4  Knee extension 4 4  Ankle dorsiflexion 5 4+  Ankle plantarflexion    Ankle inversion    Ankle eversion      FUNCTIONAL TESTS:  30 seconds chair stand test: to be assessed   GAIT: Distance walked: 100 ft Assistive device utilized: None Level of assistance: Complete Independence Comments: decreased TKE bilaterally, mildly forward flexed posture    TODAY'S TREATMENT:       [facility] Adult PT Treatment:                                                DATE: 02/08/2023  Therapeutic Exercise:  nu-step L5 x 52m   Therapeutic Activity:  Reassessment of objective measures and subjective assessment regarding progress towards established goals and plan for independence with prescribed home program following discharged from PT                                                                                                                            HOME EXERCISE PROGRAM: Access Code: [email or web address] URL: [email or web address] Date: 02/08/2023 Prepared by: Mauri Reading  Exercises - Small Range Straight Leg Raise  - 1 x daily - 3-4 x weekly - 3 sets - 10 reps - Supine March with Resistance Band  - 1 x daily - 3-4 x weekly - 2 sets - 10 reps - Clamshell with Resistance  - 1 x daily - 3-4 x weekly - 2 sets - 10 reps - 3 sec hold - Supine Hip Adduction Isometric with Ball  - 1 x daily - 3-4 x weekly - 2 sets - 10 reps - 10'' hold - Sitting Knee Extension with Resistance  - 1 x daily - 3-4 x weekly - 2 sets - 10 reps - 3 sec hold  ASSESSMENT:  CLINICAL IMPRESSION: Hulene has attended 6 total PT visits and has met all established goals at this time. Provided patient with updated home exercise program and encouraged her to continue with 20-30 minute  walks daily. She will be discharged from skilled PT at this time and will return to care of  PCP.      OBJECTIVE IMPAIRMENTS: decreased activity tolerance, decreased endurance, decreased mobility, difficulty walking, decreased strength, and pain.   ACTIVITY LIMITATIONS: carrying, lifting, standing, squatting, stairs, transfers, bed mobility, and locomotion level  PARTICIPATION LIMITATIONS: cleaning, driving, shopping, and community activity  PERSONAL FACTORS: Age, Time since onset of injury/illness/exacerbation, and 3+ comorbidities: PMHx includes Knee OA, Diabetes, HTN, mild cognitive impairment and transient global amnesia  are also affecting patient's functional outcome.   REHAB POTENTIAL: Fair    CLINICAL DECISION MAKING: Stable/uncomplicated  EVALUATION COMPLEXITY: Low   GOALS: Goals reviewed with patient? Yes  SHORT TERM GOALS: Target date: 01/28/2023   Patient will be independent with initial home program for LE strengthening.  Baseline: to be provided at next visit  Goal status: MET; "I'm doing all the exercises, except the bridges, twice a day."    LONG TERM GOALS: Target date: 02/18/2023   Patient will report improved overall functional ability with FOTO score of 67 or greater  Baseline: 62 02/08/23: 81 Goal status: MET  2.  Patient will demonstrate at least 4+/5 MMT with BIL LE strength testing.  Baseline:  MMT Right eval Left eval Right 02/08/23 Left 02/08/23  Hip flexion 4+ 4+ 4+ 4+  Hip abduction 5 5    Hip adduction 5 5    Hip internal rotation 4 4- 4+ 4+  Hip external rotation 4 4 4 4   Knee flexion 4 4 5 5   Knee extension 4 4 4+ 4+  Ankle dorsiflexion 5 4+ 5 4+   Goal status: Partially MET   3.  Patient will demonstrate ability to perform at least 10 STS in 30 seconds.  Baseline: to be assessed at f/u visit 02/08/23: 10 STS Goal status: MET  4.  Patient will report ability to resume community walking for at least 30 minutes/day.  Baseline:  limited d/t pain and decreased endurance.  Goal status: MET  5.  Patient will report 7/10 worst knee pain, with exception of sleeping at night.  Baseline: 10/10 worst pain  01/26/23: 5-6/10 02/08/23: 6/10 and "it's no longer waking me up at night" Goal status: MET    PLAN:  PT FREQUENCY: 1-2x/week  PT DURATION: 6 weeks  PLANNED INTERVENTIONS: 97164- PT Re-evaluation, 97110-Therapeutic exercises, 97530- Therapeutic activity, 97112- Neuromuscular re-education, 97535- Self Care, 63016- Manual therapy, 97014- Electrical stimulation (unattended), Patient/Family education, Taping, Dry Needling, Joint mobilization, Cryotherapy, and Moist heat  PLAN FOR NEXT SESSION: LE strengthening, TKE, assess 30 sec chair stand  Referring diagnosis? Primary osteoarthritis of left knee [M17.12]  Treatment diagnosis? (if different than referring diagnosis) Chronic pain of both knees What was this (referring dx) caused by? []  Surgery []  Fall [x]  Ongoing issue [x]  Arthritis []  Other: ____________  Laterality: []  Rt []  Lt [x]  Both  Check all possible CPT codes:  *CHOOSE 10 OR LESS*    See Planned Interventions listed in the Plan section of the Evaluation.     Mauri Reading, PT, DPT   02/08/2023, 9:54 AM

## 2023-02-15 ENCOUNTER — Ambulatory Visit: Payer: Medicare HMO

## 2023-03-09 DIAGNOSIS — H524 Presbyopia: Secondary | ICD-10-CM | POA: Diagnosis not present

## 2023-03-09 DIAGNOSIS — E119 Type 2 diabetes mellitus without complications: Secondary | ICD-10-CM | POA: Diagnosis not present

## 2023-03-09 DIAGNOSIS — H52223 Regular astigmatism, bilateral: Secondary | ICD-10-CM | POA: Diagnosis not present

## 2023-03-09 DIAGNOSIS — Z135 Encounter for screening for eye and ear disorders: Secondary | ICD-10-CM | POA: Diagnosis not present

## 2023-03-09 DIAGNOSIS — H2513 Age-related nuclear cataract, bilateral: Secondary | ICD-10-CM | POA: Diagnosis not present

## 2023-03-09 DIAGNOSIS — H5203 Hypermetropia, bilateral: Secondary | ICD-10-CM | POA: Diagnosis not present

## 2023-03-09 LAB — OPHTHALMOLOGY REPORT-SCANNED

## 2023-03-17 ENCOUNTER — Other Ambulatory Visit: Payer: Self-pay | Admitting: Family Medicine

## 2023-03-24 ENCOUNTER — Other Ambulatory Visit: Payer: Self-pay | Admitting: Family Medicine

## 2023-03-31 ENCOUNTER — Ambulatory Visit
Admission: RE | Admit: 2023-03-31 | Discharge: 2023-03-31 | Disposition: A | Discharge status: Home / Self Care | Payer: Medicare HMO | Source: Ambulatory Visit | Attending: Family Medicine | Admitting: Family Medicine

## 2023-03-31 DIAGNOSIS — Z1231 Encounter for screening mammogram for malignant neoplasm of breast: Secondary | ICD-10-CM | POA: Diagnosis not present

## 2023-04-12 ENCOUNTER — Other Ambulatory Visit: Payer: Self-pay | Admitting: Family Medicine

## 2023-04-12 DIAGNOSIS — E7800 Pure hypercholesterolemia, unspecified: Secondary | ICD-10-CM

## 2023-04-25 ENCOUNTER — Encounter: Payer: Self-pay | Admitting: Podiatry

## 2023-04-25 ENCOUNTER — Ambulatory Visit: Payer: Medicare HMO | Admitting: Podiatry

## 2023-04-25 VITALS — Ht 62.0 in | Wt 176.0 lb

## 2023-04-25 DIAGNOSIS — L84 Corns and callosities: Secondary | ICD-10-CM | POA: Diagnosis not present

## 2023-04-25 DIAGNOSIS — M79675 Pain in left toe(s): Secondary | ICD-10-CM

## 2023-04-25 DIAGNOSIS — E119 Type 2 diabetes mellitus without complications: Secondary | ICD-10-CM

## 2023-04-25 DIAGNOSIS — M2012 Hallux valgus (acquired), left foot: Secondary | ICD-10-CM | POA: Diagnosis not present

## 2023-04-25 DIAGNOSIS — M79674 Pain in right toe(s): Secondary | ICD-10-CM | POA: Diagnosis not present

## 2023-04-25 DIAGNOSIS — M2011 Hallux valgus (acquired), right foot: Secondary | ICD-10-CM | POA: Diagnosis not present

## 2023-04-25 DIAGNOSIS — B351 Tinea unguium: Secondary | ICD-10-CM

## 2023-05-03 ENCOUNTER — Other Ambulatory Visit: Payer: Self-pay | Admitting: Family Medicine

## 2023-05-03 NOTE — Progress Notes (Signed)
 ANNUAL DIABETIC FOOT EXAM  Subjective: Linda Arnold presents today for annual diabetic foot exam.  Chief Complaint  Patient presents with   [facility]    Per patient "everything is the same"    Patient confirms h/o diabetes.  Patient denies any h/o foot wounds.  Evette Georges, MD is patient's PCP.  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   Leucocytosis    Osteoarthritis of right knee 10/29/2012   Right knee pain 08/08/2012   Hypertension 02/06/2012   Diabetes mellitus type 2, noninsulin dependent (HCC) 12/25/2006   PNEUMONOPATHY, 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     Blood Glucose Monitoring Suppl (ACCU-CHEK GUIDE) w/Device KIT USE AS DIRECTED 1 kit 3   Calcium Citrate (CITRACAL PO) Take 1 tablet by mouth in the morning and at bedtime.     lidocaine (LIDODERM) 5 % Place 1 patch onto the skin daily. Remove & Discard patch within 12 hours or as directed by MD 30 patch 0   losartan (COZAAR) 25 MG  tablet Take 1 tablet (25 mg total) by mouth daily. 90 tablet 2   metFORMIN (GLUCOPHAGE) 500 MG tablet Take 1 tablet (500 mg total) by mouth daily with breakfast. 90 tablet 2   mirabegron ER (MYRBETRIQ) 25 MG TB24 tablet Take 1 tablet (25 mg total) by mouth daily. 30 tablet 0   pravastatin (PRAVACHOL) 20 MG tablet TAKE 1 TABLET AT BEDTIME 90 tablet 3   No current facility-administered medications on file prior to visit.    Allergies  Allergen Reactions   Betadine [Povidone Iodine] Rash   Lisinopril Rash   Social History   Occupational History   Occupation: retired  Tobacco Use   Smoking status: Never    Passive exposure: Never   Smokeless tobacco: Never  Vaping Use   Vaping status: Never Used  Substance and Sexual Activity   Alcohol use: Never   Drug use: No   Sexual activity: Not Currently   Family History  Problem Relation Age of Onset   Diabetes Mother        died at age 65   Congestive Heart Failure Mother    Diabetes Sister    Hyperlipidemia Sister    Hypertension Sister    Cancer Brother 52       Lung cancer x2 brothers   Hypertension Brother    Other Father  died in 1970 from not being able to "pass his urine"   Immunization History  Administered Date(s) Administered   Fluad Quad(high Dose 65+) 12/01/2020, 11/25/2021   Fluad Trivalent(High Dose 65+) 11/22/2022   Influenza Split 03/15/2012   Influenza, High Dose Seasonal PF 11/27/2017, 11/20/2018   Influenza,inj,Quad PF,6+ Mos 11/22/2012   Moderna Sars-Covid-2 Vaccination 06/19/2019, 07/17/2019   PFIZER Comirnaty(Gray Top)Covid-19 Tri-Sucrose Vaccine 04/07/2020   PNEUMOCOCCAL CONJUGATE-20 03/15/2022   Pfizer Covid-19 Vaccine Bivalent Booster 15yrs & up 02/19/2021   Pneumococcal Polysaccharide-23 10/01/2004, 11/22/2012   Td 10/01/2004     Review of Systems: Negative except as noted in the HPI.   Objective: There were no vitals filed for this visit.  Linda Arnold is a pleasant 73 y.o. female in  NAD. AAO X 3.  Diabetic foot exam was performed with the following findings:   Vascular Examination: Capillary refill time immediate b/l. Vascular status intact b/l with palpable pedal pulses. Pedal hair present b/l. No pain with calf compression b/l. Skin temperature gradient WNL b/l. No cyanosis or clubbing b/l. No ischemia or gangrene noted b/l.   Neurological Examination: Sensation grossly intact b/l with 10 gram monofilament. Vibratory sensation intact b/l.   Dermatological Examination: Pedal skin with normal turgor, texture and tone b/l.  No open wounds. No interdigital macerations.   Toenails 1-5 b/l thick, discolored, elongated with subungual debris and pain on dorsal palpation.   Porokeratotic lesion(s) submet head 1 right foot and submet head 5 right foot. No erythema, no edema, no drainage, no fluctuance.  Musculoskeletal Examination: HAV with bunion deformity noted b/l LE.  Radiographs: None     Lab Results  Component Value Date   HGBA1C 6.0 (H) 11/22/2022   ADA Risk Categorization: Low Risk :  Patient has all of the following: Intact protective sensation No prior foot ulcer  No severe deformity Pedal pulses present  Assessment: 1. Pain due to onychomycosis of toenails of both feet   2. Callus of foot   3. Hallux valgus, acquired, bilateral   4. Diabetes mellitus type 2, noninsulin dependent (HCC)   5. Encounter for diabetic foot exam (HCC)     Plan: Diabetic foot examination performed today. All patient's and/or POA's questions/concerns addressed on today's visit. Toenails 1-5 debrided in length and girth without incident. Callus(es) submet head 1 right foot and submet head 5 right foot pared with sharp debridement without incident. Continue daily foot inspections and monitor blood glucose per PCP/Endocrinologist's recommendations.Continue soft, supportive shoe gear daily. Report any pedal injuries to medical professional. Call office if there are any  questions/concerns. -Patient/POA to call should there be question/concern in the interim. Return in about 4 months (around 08/23/2023).  Freddie Breech, DPM      [place] LOCATION: [address], [place] [address]                   Office [phone]   [facility] LOCATION: [address] [place], [place] [address] Office [phone]

## 2023-05-25 ENCOUNTER — Encounter: Payer: Self-pay | Admitting: Neurology

## 2023-05-25 ENCOUNTER — Ambulatory Visit (INDEPENDENT_AMBULATORY_CARE_PROVIDER_SITE_OTHER): Payer: Medicare HMO | Admitting: Neurology

## 2023-05-25 VITALS — BP 141/80 | HR 79 | Ht 63.0 in | Wt 178.4 lb

## 2023-05-25 DIAGNOSIS — G3184 Mild cognitive impairment, so stated: Secondary | ICD-10-CM

## 2023-05-25 NOTE — Progress Notes (Addendum)
 PATIENT: Linda Arnold DOB: [birth year]  REASON FOR VISIT: follow up for memory HISTORY FROM: patient PRIMARY NEUROLOGIST: Dr. Gracie Lav   ASSESSMENT AND PLAN 73 y.o. year old female   1.  Mild cognitive impairment 2.  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  MoCA 26/30 (MMSE 29/04 June 2022) -Referral to neuropsychological evaluation to better characterize memory deficits -Screen with ATN profile -Recommend continue aspirin 81 mg daily, continue moderate exercise, continue follow-up with PCP for management of vascular risk factors -Follow-up in 1 year or sooner if needed  Addendum 05/31/23: ATN testing was NORMAL.   Addendum 08/02/23 SS: Neuro psych testing May 2025 showed felt Minor Neurocognitive Disorder, re-evaluate in 12 months   HISTORY  Linda Arnold is a 73 year old female, seen in request by her primary care physician Dr. Nida Barrow, [facility] for evaluation of memory loss, initial evaluation was on September 18, 2018.   I have reviewed and summarized the referring note from the referring physician.  She had a past medical history of diabetes, hyperlipidemia, she used to work at [employer] job, did daycare work for about 3 years, she lives alone, still drives, around 2008, she noticed gradual onset memory loss, initially forget people's name, telephone number, gradually getting worse, she forgot what she would have for breath first quickly   She was admitted to the hospital on August 14, 2018, while her sister talked with her, she was noted to repeating questions, confused, her sister came over to check on her, realize she was very confused, did not know what was going on, her sister called EMS, she was admitted to the hospital, she cannot remember the ride to hospital, woke up few hours later realized that she is in hospital.   I personally reviewed the brain on August 15, 2018, punctuated acute infarction at the right  precentral gyri, otherwise there was generalized atrophy, especially at the left perisylvian fissure, CT angiogram of neck and brain showed no large vessel disease.   Laboratory evaluations showed normal lipid profile, A1c was 6.1, BMP showed calcium 8.4, normal CBC, Echocardiogram on August 15 2018, showed ejection fraction 60 to 65%,  UPDATE May 20 2019: She is overall doing very well, there is no longer confusional episode, she continue complains of intermittent mild short-term memory loss, she gave me the example, while talking with her friend, she could not think of what breakfast she had, but she can recall it few minutes later.  EEG was normal in July 2020,  Update December 20, 2018 SS: She had a normal EEG in July 2020.  She has not had any further episodes of amnesia or prolonged memory loss.  She indicates she continues to report trouble with her short-term memory. Specifically, she records her daily food intake, often forgets what she ate for meals.  She lives alone, does her own ADLs, pays her bills, and drives a car without difficulty.  She walks 30 minutes daily.  Within the last month she was diagnosed with obstructive sleep apnea, has now started CPAP.  She presents today for follow-up unaccompanied.  She does report that she has noticed memory loss for the last 3 years. No family history of memory loss.   Update 05/20/2019 Dr. Gracie Lav: TSH, RPR, Vitamin B12 were normal. I personally reviewed MRI of the brain in June 2020, mild supratentorium small vessel disease there was no acute abnormality.  She has obstructive sleep apnea, faithfully using her CPAP  machine every night, exercise regularly, drink at least 6 cups of water daily.  She drives to clinic today by memory, is able to handle her household bills without any difficulty.  Update May 20, 2020 SS: Here today alone, feels short term memory is slipping. Lives alone, managing her household, finances, driving well. Ex: takes awhile to  remember what she had for meals, comes to her later.. Hasn't given up anything because memory. No friends or family mention memory concerns for her. Using CPAP. Remains on aspirin 81 mg daily, exercise with walking. Sees PCP. MMSE 29/30 today.  Update May 20, 2021 SS: here today alone, lives alone, drives a car, manages her household alone well, 29/30 MMSE today. Still trouble with short term memory. No health issues. Walks 30 minutes everyday. Has extended family, doesn't have relationship with her son. Stays active with social activity, looks great today.   Update May 24, 2022 SS: MMSE 29/30. Still trouble with short term memory. Her family or friends don't have any concerns. Lives alone, manages her own affairs. Remains on aspirin 81 mg daily. Basically trouble is with name recall, it will eventually come to her. Looks great today.  Update May 25, 2023 SS: MOCA 26/30. Ordered neuro psych last visit, reports no one called her. Feels memory is good but takes her a little longer to retrieve. Continues to live alone, manges her affairs, drives. No concerns about memory. Her memory comes to her. Is active is social groups. Has a son, but they aren't close, her sister is closest relative and she isn't concerned. Remains on aspirin 81 mg daily. Very nicely dressed today.   REVIEW OF SYSTEMS: Out of a complete 14 system review of symptoms, the patient complains only of the following symptoms, and all other reviewed systems are negative.  See HPI  ALLERGIES: Allergies  Allergen Reactions   Betadine [Povidone Iodine] Rash   Lisinopril  Rash    HOME MEDICATIONS: Outpatient Medications Prior to Visit  Medication Sig Dispense Refill   ACCU-CHEK GUIDE TEST test strip USE AS DIRECTED 300 strip 3   Alcohol Swabs (DROPSAFE ALCOHOL PREP) 70 % PADS USE FOUR TIMES DAILY AS NEEDED 400 each 3   ASPIRIN 81 PO Take 81 mg by mouth daily.     Blood Glucose Monitoring Suppl (ACCU-CHEK GUIDE) w/Device KIT USE  AS DIRECTED 1 kit 3   Calcium Citrate (CITRACAL PO) Take 1 tablet by mouth in the morning and at bedtime.     lidocaine  (LIDODERM ) 5 % Place 1 patch onto the skin daily. Remove & Discard patch within 12 hours or as directed by MD 30 patch 0   losartan  (COZAAR ) 25 MG tablet Take 1 tablet (25 mg total) by mouth daily. 90 tablet 2   metFORMIN  (GLUCOPHAGE ) 500 MG tablet TAKE 1 TABLET EVERY DAY WITH BREAKFAST 90 tablet 3   pravastatin  (PRAVACHOL ) 20 MG tablet TAKE 1 TABLET AT BEDTIME 90 tablet 3   mirabegron  ER (MYRBETRIQ ) 25 MG TB24 tablet Take 1 tablet (25 mg total) by mouth daily. (Patient not taking: Reported on 05/25/2023) 30 tablet 0   No facility-administered medications prior to visit.    PAST MEDICAL HISTORY: Past Medical History:  Diagnosis Date   Arthritis 1999   knees   Diabetes mellitus without complication (HCC) 2005   Dysphagia    Hyperlipidemia    Hypertension    Memory loss     PAST SURGICAL HISTORY: Past Surgical History:  Procedure Laterality Date   CESAREAN SECTION  TUBAL LIGATION      FAMILY HISTORY: Family History  Problem Relation Age of Onset   Diabetes Mother        died at age 34   Congestive Heart Failure Mother    Diabetes Sister    Hyperlipidemia Sister    Hypertension Sister    Cancer Brother 44       Lung cancer x2 brothers   Hypertension Brother    Other Father        died in [place] from not being able to "pass his urine"    SOCIAL HISTORY: Social History   Socioeconomic History   Marital status: Widowed    Spouse name: Not on file   Number of children: 1   Years of education: 14   Highest education level: Associate degree: academic program  Occupational History   Occupation: retired  Tobacco Use   Smoking status: Never    Passive exposure: Never   Smokeless tobacco: Never  Vaping Use   Vaping status: Never Used  Substance and Sexual Activity   Alcohol use: Never   Drug use: No   Sexual activity: Not Currently  Other Topics  Concern   Not on file  Social History Narrative   Patient lives alone in [place].    Patient is a widower and has one son located in [place].    Patient walks daily for exercise.    Patient enjoys reading, shopping, friends, and walking.    Social Drivers of Corporate investment banker Strain: Low Risk  (08/16/2022)   Overall Financial Resource Strain (CARDIA)    Difficulty of Paying Living Expenses: Not hard at all  Food Insecurity: Low Risk  (12/01/2022)   Received from [facility]   Hunger Vital Sign    Worried About Running Out of Food in the Last Year: Never true    Ran Out of Food in the Last Year: Never true  Transportation Needs: No Transportation Needs (12/01/2022)   Received from [facility]    In the past 12 months, has lack of reliable transportation kept you from medical appointments, meetings, work or from getting things needed for daily living? : No  Physical Activity: Inactive (08/16/2022)   Exercise Vital Sign    Days of Exercise per Week: 0 days    Minutes of Exercise per Session: 0 min  Stress: No Stress Concern Present (08/16/2022)   [employer] of Occupational Health - Occupational Stress Questionnaire    Feeling of Stress : Not at all  Social Connections: Moderately Integrated (08/16/2022)   Social Connection and Isolation Panel [NHANES]    Frequency of Communication with Friends and Family: More than three times a week    Frequency of Social Gatherings with Friends and Family: Twice a week    Attends Religious Services: More than 4 times per year    Active Member of [employer] or Organizations: Yes    Attends Banker Meetings: 1 to 4 times per year    Marital Status: Widowed  Intimate Partner Violence: Not At Risk (08/16/2022)   Humiliation, Afraid, Rape, and Kick questionnaire    Fear of Current or Ex-Partner: No    Emotionally Abused: No    Physically Abused: No    Sexually Abused: No   PHYSICAL EXAM  Vitals:   05/25/23  1423  BP: (!) 141/80  Pulse: 79  Weight: 178 lb 6.4 oz (80.9 kg)  Height: 5\' 3"  (1.6 m)    Body  mass index is 31.6 kg/m.  Generalized: Well developed, in no acute distress, well-appearing    05/24/2022    2:16 PM 05/20/2021   12:47 PM 05/20/2020   12:41 PM  MMSE - Mini Mental State Exam  Orientation to time 5 5 5   Orientation to Place 5 5 5   Registration 3 3 3   Attention/ Calculation 4 4 5   Recall 3 3 2   Language- name 2 objects 2 2 2   Language- repeat 1 1 1   Language- follow 3 step command 3 3 3   Language- read & follow direction 1 1 1   Write a sentence 1 1 1   Copy design 1 1 1   Total score 29 29 29       05/25/2023    2:30 PM  Montreal Cognitive Assessment   Visuospatial/ Executive (0/5) 5  Naming (0/3) 2  Attention: Read list of digits (0/2) 2  Attention: Read list of letters (0/1) 1  Attention: Serial 7 subtraction starting at 100 (0/3) 3  Language: Repeat phrase (0/2) 1  Language : Fluency (0/1) 1  Abstraction (0/2) 2  Delayed Recall (0/5) 3  Orientation (0/6) 6  Total 26  Adjusted Score (based on education) 26   Neurological examination  Mentation: Alert oriented to time, place, history taking. Follows all commands speech and language fluent, well dressed and groomed Cranial nerve II-XII: Pupils were equal round reactive to light, right exotropia,  Extraocular movements were full, visual field were full on confrontational test. Facial sensation and strength were normal. Head turning and shoulder shrug  were normal and symmetric. Motor: The motor testing reveals 5 over 5 strength of all 4 extremities. Good symmetric motor tone is noted throughout.  Sensory: Sensory testing is intact to soft touch on all 4 extremities. No evidence of extinction is noted.  Coordination: Cerebellar testing reveals good finger-nose-finger and heel-to-shin bilaterally.  Gait and station: Gait is normal. Reflexes: Deep tendon reflexes are symmetric and normal bilaterally.    DIAGNOSTIC DATA (LABS, IMAGING, TESTING) - I reviewed patient records, labs, notes, testing and imaging myself where available.  Lab Results  Component Value Date   WBC 6.9 08/16/2018   HGB 12.1 08/16/2018   HCT 36.8 08/16/2018   MCV 92.0 08/16/2018   PLT 173 08/16/2018      Component Value Date/Time   NA 140 03/15/2022 1452   K 4.3 03/15/2022 1452   CL 100 03/15/2022 1452   CO2 23 03/15/2022 1452   GLUCOSE 103 (H) 03/15/2022 1452   GLUCOSE 92 08/16/2018 0319   BUN 10 03/15/2022 1452   CREATININE 0.74 03/15/2022 1452   CREATININE 0.76 02/18/2013 0830   CALCIUM 9.3 03/15/2022 1452   PROT 6.8 07/23/2020 1611   ALBUMIN 4.5 07/23/2020 1611   AST 23 07/23/2020 1611   ALT 25 07/23/2020 1611   ALKPHOS 87 07/23/2020 1611   BILITOT 0.3 07/23/2020 1611   GFRNONAA >60 08/16/2018 0319   GFRAA >60 08/16/2018 0319   Lab Results  Component Value Date   CHOL 176 03/15/2022   HDL 74 03/15/2022   LDLCALC 91 03/15/2022   LDLDIRECT 112 (H) 10/29/2012   TRIG 59 03/15/2022   CHOLHDL 2.4 03/15/2022   Lab Results  Component Value Date   HGBA1C 6.0 (H) 11/22/2022   Lab Results  Component Value Date   VITAMINB12 522 09/18/2018   Lab Results  Component Value Date   TSH 2.840 09/18/2018   Cortland Ding, DNP  [facility] [address], [address]  [place], [place] [address] [phone]

## 2023-05-25 NOTE — Patient Instructions (Signed)
 Referral for neuropsychological evaluation  Check blood test to screen for Alzheimer's proteins, amyloid  Continue exercise, continue social activity, brain stimulating activity. Continue aspirin 81 mg daily. Continue to see primary care Strict management of vascular risk factors with a goal BP less than 130/90, A1c less than 7.0, LDL less than 70 for secondary stroke prevention. Follow up in 1 year

## 2023-05-31 LAB — ATN PROFILE
A -- Beta-amyloid 42/40 Ratio: 0.109 (ref >0.102)
Beta-amyloid 40: 140.77 pg/mL
Beta-amyloid 42: 15.29 pg/mL
N -- NfL, Plasma: 1.94 pg/mL (ref 0.00–7.64)
T -- p-tau181: 0.54 pg/mL (ref 0.00–0.97)

## 2023-06-15 ENCOUNTER — Encounter: Payer: Self-pay | Admitting: Psychology

## 2023-06-27 ENCOUNTER — Ambulatory Visit (INDEPENDENT_AMBULATORY_CARE_PROVIDER_SITE_OTHER): Admitting: Family Medicine

## 2023-06-27 ENCOUNTER — Encounter: Payer: Self-pay | Admitting: Family Medicine

## 2023-06-27 VITALS — BP 132/80 | HR 81 | Wt 175.0 lb

## 2023-06-27 DIAGNOSIS — G3184 Mild cognitive impairment, so stated: Secondary | ICD-10-CM | POA: Diagnosis not present

## 2023-06-27 DIAGNOSIS — E119 Type 2 diabetes mellitus without complications: Secondary | ICD-10-CM

## 2023-06-27 DIAGNOSIS — Z0000 Encounter for general adult medical examination without abnormal findings: Secondary | ICD-10-CM

## 2023-06-27 DIAGNOSIS — Q649 Congenital malformation of urinary system, unspecified: Secondary | ICD-10-CM

## 2023-06-27 DIAGNOSIS — Z7984 Long term (current) use of oral hypoglycemic drugs: Secondary | ICD-10-CM

## 2023-06-27 DIAGNOSIS — M159 Polyosteoarthritis, unspecified: Secondary | ICD-10-CM

## 2023-06-27 LAB — POCT URINALYSIS DIP (MANUAL ENTRY)
Bilirubin, UA: NEGATIVE
Glucose, UA: NEGATIVE mg/dL
Ketones, POC UA: NEGATIVE mg/dL
Leukocytes, UA: NEGATIVE
Nitrite, UA: NEGATIVE
Protein Ur, POC: NEGATIVE mg/dL
Spec Grav, UA: 1.01 (ref 1.010–1.025)
Urobilinogen, UA: 0.2 U/dL
pH, UA: 5.5 (ref 5.0–8.0)

## 2023-06-27 LAB — POCT UA - MICROSCOPIC ONLY: WBC, Ur, HPF, POC: NONE SEEN (ref 0–5)

## 2023-06-27 LAB — POCT GLYCOSYLATED HEMOGLOBIN (HGB A1C): HbA1c, POC (controlled diabetic range): 5.9 % (ref 0.0–7.0)

## 2023-06-27 MED ORDER — ZOSTER VAC RECOMB ADJUVANTED 50 MCG/0.5ML IM SUSR: 0.5000 mL | Freq: Once | INTRAMUSCULAR | 0 refills | Status: AC

## 2023-06-27 NOTE — Progress Notes (Signed)
    SUBJECTIVE:   CHIEF COMPLAINT / HPI:   Mild cognitive impairment Follows with neurology.  HTN testing normal.  MoCA 26 out of 30.  Continue aspirin, moderate exercise per neurology.  T2DM Has remained on 500 mg metformin  daily.  Otherwise improving dietary habits.  Also on pravastatin  20 mg daily.  Urinary bubbling, urge incontinence Has continued on and off for the last year. Previously seen with reassuring UA, urine culture, and overall exam. She also has urge incontinence that has been worsening. Previously was sent to pelvic PT but did not follow up. No dysuria currently. Only has frequency when she drinks a lot of water.  PERTINENT  PMH / PSH: Sleep apnea, osteoarthritis  OBJECTIVE:   BP 132/80   Pulse 81   Wt 175 lb (79.4 kg)   SpO2 98%   BMI 31.00 kg/m   General: Alert and oriented, in NAD Skin: Warm, dry, and intact without lesions HEENT: NCAT, EOM grossly normal, midline nasal septum Cardiac: RRR, no m/r/g appreciated Respiratory: CTAB, breathing and speaking comfortably on RA Abdominal: Soft, nontender, nondistended, normoactive bowel sounds Extremities: Moves all extremities grossly equally Neurological: No gross focal deficit Psychiatric: Appropriate mood and affect  ASSESSMENT/PLAN:   Assessment & Plan Diabetes mellitus without complication (HCC) A1c today at goal at 5.9.  Continue metformin  and pravastatin .  Recheck lipid panel and urine microalbumin/creatinine ratio today. Urinary anomaly Persistent though reassuringly without dysuria.  UA with trace blood though no significant RBCs on microscopic exam.  Appears this has been more consistent finding the patient has not followed up with urology.  Will refer to urology for consideration of cystoscopy and further discussion of urge incontinence.  Consider fistular tract leading to bubbling given negative studies for proteinuria or infection. Mild cognitive impairment Continues to follow with  neurology. Healthcare maintenance Gave patient printed prescription for the shingles vaccine to take to pharmacy.  Discussed getting her diabetic eye exam completed.  Will send message to staff to get her scheduled for Medicare annual wellness visit.   Genetta Kenning, MD [facility]

## 2023-06-27 NOTE — Patient Instructions (Addendum)
 We will collect labs today. This will help look at your blood sugar, kidney function, and urine. I will call you back when I have the results.  I have given you a printed prescription for your shingles vaccination that you can get at any pharmacy.  Be sure to schedule an appointment with your eye doctor to have a diabetic eye exam completed this year.  I will send a message to our schedulers to get your medicare annual wellness visit scheduled.

## 2023-06-28 ENCOUNTER — Telehealth: Payer: Self-pay | Admitting: Family Medicine

## 2023-06-28 DIAGNOSIS — Q649 Congenital malformation of urinary system, unspecified: Secondary | ICD-10-CM | POA: Insufficient documentation

## 2023-06-28 DIAGNOSIS — E7800 Pure hypercholesterolemia, unspecified: Secondary | ICD-10-CM

## 2023-06-28 LAB — BASIC METABOLIC PANEL WITH GFR
BUN/Creatinine Ratio: 15 (ref 12–28)
BUN: 11 mg/dL (ref 8–27)
CO2: 28 mmol/L (ref 20–29)
Calcium: 9.4 mg/dL (ref 8.7–10.3)
Chloride: 102 mmol/L (ref 96–106)
Creatinine, Ser: 0.75 mg/dL (ref 0.57–1.00)
Glucose: 120 mg/dL — ABNORMAL HIGH (ref 70–99)
Potassium: 3.9 mmol/L (ref 3.5–5.2)
Sodium: 142 mmol/L (ref 134–144)
eGFR: 83 mL/min/{1.73_m2} (ref >59)

## 2023-06-28 LAB — LIPID PANEL
Chol/HDL Ratio: 2.9 ratio (ref 0.0–4.4)
Cholesterol, Total: 226 mg/dL — ABNORMAL HIGH (ref 100–199)
HDL: 77 mg/dL (ref >39)
LDL Chol Calc (NIH): 131 mg/dL — ABNORMAL HIGH (ref 0–99)
Triglycerides: 102 mg/dL (ref 0–149)
VLDL Cholesterol Cal: 18 mg/dL (ref 5–40)

## 2023-06-28 MED ORDER — PRAVASTATIN SODIUM 40 MG PO TABS
40.0000 mg | ORAL_TABLET | Freq: Every day | ORAL | 3 refills | Status: DC
Start: 2023-06-28 — End: 2023-07-04

## 2023-06-28 NOTE — Telephone Encounter (Signed)
 Called to discuss lab results with patient.  Will increase pravastatin  to 40 mg daily given elevated LDL.  Can recheck in 1 year.  UA with trace hematuria though microscopic without RBCs.  Advised to follow back up with urologist at [facility] urology.  Patient will make an appointment.  Follow-up with me in 6 months or sooner if needed.

## 2023-06-28 NOTE — Assessment & Plan Note (Signed)
Continues to follow with neurology 

## 2023-06-28 NOTE — Assessment & Plan Note (Signed)
 Persistent though reassuringly without dysuria.  UA with trace blood though no significant RBCs on microscopic exam.  Appears this has been more consistent finding the patient has not followed up with urology.  Will refer to urology for consideration of cystoscopy and further discussion of urge incontinence.  Consider fistular tract leading to bubbling given negative studies for proteinuria or infection.

## 2023-06-28 NOTE — Assessment & Plan Note (Signed)
 Gave patient printed prescription for the shingles vaccine to take to pharmacy.  Discussed getting her diabetic eye exam completed.  Will send message to staff to get her scheduled for Medicare annual wellness visit.

## 2023-07-04 ENCOUNTER — Telehealth: Payer: Self-pay

## 2023-07-04 DIAGNOSIS — E7800 Pure hypercholesterolemia, unspecified: Secondary | ICD-10-CM

## 2023-07-04 MED ORDER — PRAVASTATIN SODIUM 40 MG PO TABS: 40.0000 mg | ORAL_TABLET | Freq: Every day | ORAL | 3 refills | Status: AC

## 2023-07-04 NOTE — Telephone Encounter (Signed)
 Patient calls nurse line in regards to Pravastatin .   She reports she needs this to go to [facility] and not [facility].   She reports she spoke with [facility] this morning and cancelled this prescription, however they would not transfer.   Will resend to [facility] per patient preference.

## 2023-07-05 ENCOUNTER — Encounter: Attending: Psychology | Admitting: Psychology

## 2023-07-05 DIAGNOSIS — R413 Other amnesia: Secondary | ICD-10-CM | POA: Insufficient documentation

## 2023-07-10 ENCOUNTER — Encounter: Attending: Psychology

## 2023-07-10 DIAGNOSIS — R413 Other amnesia: Secondary | ICD-10-CM | POA: Diagnosis not present

## 2023-07-10 DIAGNOSIS — G3184 Mild cognitive impairment, so stated: Secondary | ICD-10-CM | POA: Diagnosis not present

## 2023-07-11 NOTE — Progress Notes (Signed)
 Behavioral Observations:  The patient was oriented to self, place, and time. Her hearing and vision were adequate for testing. She ambulated independently and without issue. No hand tremor was noted during testing. Her speech was prosodic, fluent, and well-articulated. She displayed no clear indications of word-finding difficulties in conversational speech and no notable paraphasic errors were noted. Receptive language appeared intact. The patient's affect was congruent with mood, mood was largely neutral to positive. The patient was alert and participated in testing as instructed. She was cooperative throughout the session. Her pace was steady. She showed no difficulties with frustration tolerance. Socially, the patient's interactions were unremarkable and consistent with the setting. No frank attentional lapses were appreciated.  Neuropsychology Note  Linda Arnold completed 110 minutes of neuropsychological testing with technician, Rhett Cella, BA, under the supervision of Aleene Hurry, PsyD., Clinical Neuropsychologist. The patient did not appear overtly distressed by the testing session, per behavioral observation or via self-report to the technician. Rest breaks were offered.   Clinical Decision Making: In considering the patient's current level of functioning, level of presumed impairment, nature of symptoms, emotional and behavioral responses during clinical interview, level of literacy, and observed level of motivation/effort, a battery of tests was selected by Dr. Georgeanne King during initial consultation on 07/05/2023. This was communicated to the technician. Communication between the neuropsychologist and technician was ongoing throughout the testing session and changes were made as deemed necessary based on patient performance on testing, technician observations and additional pertinent factors such as those listed above.  Tests Administered: Automatic Data Edition (BNT-2) Brief  Visuospatial Memory Test-Revised (BVMT-R) Clock Drawing Test Controlled Oral Word Association Test (FAS & Animals) Delis-Kaplan Executive Function System (D-KEFS), select subtests Hopkins Verbal Learning Test - Revised (HVLT-R) Neuropsychological Assessment Battery (NAB), select subtest Repeatable Battery for the Assessment of Neuropsychological Status Update (RBANS) Trail Making Test (TMT; Part A & B) Wechsler Adult Intelligence Scale-Fourth Edition (WAIS-IV), select subtests Wechsler Memory Scale-Fourth Edition (WMS-IV) , select subtests Wechsler Test of Adult Reading (WTAR) Geriatric Depression Scale-Short Form (GDS-SF) Geriatric Anxiety Inventory (GAI)  Results: Note: This summary of test scores accompanies the interpretive report and should not be interpreted by unqualified individuals or in isolation without reference to the report. Test scores are relative to age, gender, and educational history as available and appropriate.   Measurement properties of test scores: IQ, Index, and Standard Scores (SS): Mean = 100; Standard Deviation = 15; Scaled Scores (ss): Mean = 10; Standard Deviation = 3; Z scores (Z): Mean = 0; Standard Deviation = 1; T scores (T); Mean = 50; Standard Deviation = 10  Intellectual/Premorbid Functioning Estimate   Norm Score Percentile  Range  Wechsler Test of Adult Reading  SS = 83 13 %ile Low Average   ATTENTION AND WORKING MEMORY    Norm Score Percentile  Range  WAIS-IV          Digit Span  ss = 10 50 %ile Average   DSF  ss = 10 50 %ile Average   Span:    7      DSB  ss = 9 37 %ile Average   Span:    5      DSS  ss = 10 50 %ile Average   Span:    5      PROCESSING SPEED    Norm Score Percentile  Range  WAIS-IV          Coding  ss = 8 25 %ile Average  LANGUAGE    Norm Score Percentile  Range  NAB Naming  t = 27 1 %ile Exceptionally Low  COWAT          FAS  t = 53 61 %ile Average   Animals  t = 53 61 %ile Average   EXECUTIVE FUNCTIONING     Norm Score Percentile  Range  DKEFS - Color-Word Interference          Color Naming  ss = 11 63 %ile Average   Word Reading  ss = 11 63 %ile Average   Inhibition  ss = 10 50 %ile Average   Errors  ss = 9 37 %ile Average   Inhibition Switching  ss = 11 63 %ile Average   Errors  ss = 7 16 %ile Low Average  Trails A  t = 41 18 %ile Low Average   Errors    0     Trails B  t = 47 37 %ile Average   Errors    0      MEMORY    Norm Score Percentile  Range  BVMT-R          Trial 1  t = 32.0 4 %ile Below Average   Trial 2  t = 26 1 %ile Exceptionally Low   Trial 3  t = 22.0 0.2 %ile Exceptionally Low   Total Recall  t = 24.0 0.5 %ile Exceptionally Low   Learning  t = 35.0 7 %ile Below Average   Delayed Recall  t = 27.0 1 %ile Exceptionally Low   % Retained    100 >16 %ile WNL   Hits     >16 %ile WNL   False Alarms     11-16 %ile Low Average   Recognition Discriminability     >16 %ile WNL  HVLT          Total Recall  t = 25.0 1 %ile Exceptionally Low   Delayed Recall  t = 34 5 %ile Below Average   %Retention  t = 56.0 73 %ile Average   Recognition Discriminability  t = 48.0 42 %ile Average  Wechsler Memory Scale, 4th Edition (WMS-4)         Log. Mem. Immediate Recall  ss = 6 9 %ile Low Average   Logical Memory Delayed Recall  ss = 5 5 %ile Below Average   Logical Recognition    10th-16th %ile %ile Low Average   VISUAL-SPATIAL    Norm Score Percentile  Range  Clock                   RBANS Visuospatial Index  SS = 66 1 %ile Exceptionally Low   RBANS Figure Copy  ss = 5 5 %ile Below Average   RBANS Line Orientation      <=2nd %ile %ile Exceptionally Low    PERSONALITY AND BEHAVIORAL FUNCTIONING      Score/Interpretation  GDS-SF Raw       2  GDS-SF Severity       Minimal.  GAI Raw       1  GAI Severity       Minimal.    Feedback to Patient: Linda Arnold will return on 07/14/2023 for an interactive feedback session with Dr. Georgeanne King at which time her test performances, clinical  impressions and treatment recommendations will be reviewed in detail. The patient understands she can contact our office should she require our assistance before this time.  110  minutes spent face-to-face with patient administering standardized tests, 30 minutes spent scoring Radiographer, therapeutic). [CPT [id number], [id number]]  Full report to follow.

## 2023-07-12 DIAGNOSIS — R3121 Asymptomatic microscopic hematuria: Secondary | ICD-10-CM | POA: Diagnosis not present

## 2023-07-12 DIAGNOSIS — N3941 Urge incontinence: Secondary | ICD-10-CM | POA: Diagnosis not present

## 2023-07-13 ENCOUNTER — Encounter (HOSPITAL_BASED_OUTPATIENT_CLINIC_OR_DEPARTMENT_OTHER): Admitting: Psychology

## 2023-07-13 DIAGNOSIS — G3184 Mild cognitive impairment, so stated: Secondary | ICD-10-CM | POA: Diagnosis not present

## 2023-07-14 ENCOUNTER — Encounter: Admitting: Psychology

## 2023-07-14 DIAGNOSIS — G3184 Mild cognitive impairment, so stated: Secondary | ICD-10-CM

## 2023-07-14 DIAGNOSIS — R413 Other amnesia: Secondary | ICD-10-CM | POA: Diagnosis not present

## 2023-07-25 NOTE — Progress Notes (Unsigned)
 NEUROPSYCHOLOGICAL EVALUATION [place]. [facility]  Physical Medicine and Rehabilitation     Patient: Linda Arnold  MRN: [id number] DOB: 07-18-1950  Age: 73 y.o. Sex: female  Race/Ethnicity: Black or African American *** Years of Education: *** Handedness: ***  Collateral Information Source: ***  Referring Provider: Wess Hammed, NP  Provider/Clinical Neuropsychologist: Loletta Ripple, PsyD  Date of Service: *** Start Time: *** End Time: ***  Location of Service:  [facility] Physical Medicine & Rehabilitation Department [place]. [facility] [address] [place]. [address] [place], [place] [address] Phone: [phone]  Billing Code/Service:            [id number]  Individuals Present: Patient was seen ***, in-person, by the provider. 1 hour and 15 minutes spent in face-to-face clinical interview and remaining 45 minutes was spent in record review, documentation, and testing protocol construction.    PATIENT CONSENT AND CONFIDENTIALITY The patient's understanding of the reason for referral was intact. Discussed limits of confidentiality including, but not limited to, posting of final evaluation report in the patient's electronic medical record for both the patient and for the referring provider and appropriate medical professionals. Patient was given the opportunity to have their questions answered. The neuropsychological evaluation process was discussed with the patient and they consented to proceed with the evaluation.  Consent for Evaluation and Treatment: Signed: Yes Explanation of Privacy Policies: Signed: Yes Discussion of Confidentiality Limits: Yes  REASON FOR REFERRAL:The patient was referred for neuropsychological evaluation by ***   Upon interview, the patient ***  HISTORY OF PRESENTING CONCERNS: The following was obtained from the patient via clinical interview.  Cognitive Symptom Onset & Course:   Current Cognitive Complaints:   Memory: ***  Processing Speed: ***  Attention & Concentration: ***  Language: ***  Visual-Spatial: ***  Executive Functioning: ***  Motor/Sensory Complaints: ***  Sensory changes: *** Balance/coordination difficulties:*** Frequent instances of dizziness/vertigo: *** Other motor difficulties: ***  Emotional and Behavioral Functioning: *** Depression: *** Anxiety: *** Other: ***  Sleep:  Estimated hours obtained each night:  Difficulties falling asleep:  Difficulties staying asleep: Feels rested and refreshed upon awakening:   History of obstructive sleep apnea:   History of vivid dreaming:  Excessive movement while asleep:  Instances of acting out her dreams:  Appetite: *** in appetite. *** unintentional weight loss.  Caffeine: ***  Alcohol Use: *** Tobacco Use: *** Recreational Substance Use: ***   Level of Functional Independence: The patient is *** with basic activities of daily living.  Finances: ***   Shopping / Meal Preparation: Household Maintenance / Chores: Firefighter / Future Obligations: Medication Management: ***   Driving: ***      Medical History/Record Review: Per records ***  ; cancer, cardiovascular, cerebrovascular, seizure, TBI, chronic pain, sleep apnea,  History of traumatic brain injury/concussion: ***   History of stroke: ***   History of heart attack: ***   History of cancer/chemotherapy:***   History of seizure activity: ***   History of known exposure to toxins: ***   Symptoms of chronic pain: ***   Experience of frequent headaches/migraines: ***     Past Medical History:  Diagnosis Date   Arthritis 1999   knees   Diabetes mellitus without complication (HCC) 2005   Dysphagia    Dysuria 03/15/2022   Hyperlipidemia    Hypertension    Impacted cerumen of left ear 11/22/2022   Interstitial cystitis 03/30/2022   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  Leucocytosis    Memory loss    Short-term memory loss 05/20/2019    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    Patient Active Problem List   Diagnosis Date Noted   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   Bladder dysfunction 08/18/2022   Acute midline thoracic back pain 06/16/2022   Healthcare maintenance 03/15/2022   Mild cognitive impairment 05/20/2021   Sleep apnea 01/27/2021   Generalized osteoarthritis of multiple sites 08/08/2012   Hypertension 02/06/2012   Diabetes mellitus type 2, noninsulin dependent (HCC) 12/25/2006   PNEUMONOPATHY, 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   HYPERCHOLESTEROLEMIA 11/16/2006    Imaging/Lab Results: ***  Family Neurologic/Medical Hx: *** Family History  Problem Relation Age of Onset   Diabetes Mother        died at age 87   Congestive Heart Failure Mother    Diabetes Sister    Hyperlipidemia Sister    Hypertension Sister    Cancer Brother 76       Lung cancer x2 brothers   Hypertension Brother    Other Father        died in Aug 27, 1968 from not being able to "pass his urine"       Medications:    Academic/Vocational History: Highest level of educational attainment: *** History of developmental delay:*** History of grade repetition:*** Enrollment in special education courses:*** History of LD/ADHD:***  Employment:***   Psychosocial: Marital Status: *** Children/Grandchildren: *** Living Situation: *** Daily Activities/Hobbies: ***  Mental Status/Behavioral Observations: The patient was seen on an outpatient basis in the [facility] PM&R office for the clinical interview *** Sensorium/Arousal: *** Orientation: *** Appearance: *** Behavior: *** Speech/Language: *** Motor: *** Social Comportment: *** Mood: *** Affect: *** Thought Process/Content: *** Ability to Participate in Interview: *** Insight: ***   SUMMARY / CLINICAL IMPRESSIONS  ***  DISPOSITION / PLAN  The patient has been set up for a formal neuropsychological assessment to objectively assess her cognitive functioning across domains to establish  the patient's cognitive profile. This data, in conjunction with information obtained via clinical interview and medical record review, will help clarify likely etiology and guide treatment recommendations. Once data collection and interpretation have been completed, the findings / diagnosis and recommendations will be reviewed and discussed with the patient during a feedback appointment with the neuropsychologist. Based on the collaborative dialogue with the patient during the feedback, recommendations may be adjusted / tailored as needed. A formal report will be produced and provided to the patient and the referring provider.   Diagnosis:     This report was generated using voice recognition software. While this document has been carefully reviewed, transcription errors may be present. I apologize in advance for any inconvenience. Please contact me if further clarification is needed.             Loletta Ripple, PsyD             Neuropsychologist

## 2023-07-31 ENCOUNTER — Encounter: Payer: Self-pay | Admitting: Psychology

## 2023-07-31 NOTE — Progress Notes (Signed)
   NEUROPSYCHOLOGICAL EVALUATION [place]. [facility]  Physical Medicine and Rehabilitation     Patient: Linda Arnold  MRN: [id number] DOB: February 04, 1951   Service Provider/Clinical Neuropsychologist: Loletta Ripple, PsyD  Date of Service: 07/14/2023 Start Time: 3 PM End Time: 4 PM  Location of Service:  [facility] Physical Medicine & Rehabilitation Department [place]. [facility] [address] [place]. [address] [place], [place] [address] Phone: [phone]   Billing Code/Service: [phone]    Individuals present: Patient and the provider Loletta Ripple, PsyD)  Provider conducted the 60-minute interactive feedback appointment in-person with the patient, unaccompanied, in the providers outpatient office.  The provider reviewed and discussed the results of neuropsychological evaluation. Follow-up interviewing was conducted as needed to refine interpretation of findings as needed. Review of results included overall findings, diagnosis, and treatment planning/recommendations that were derived from integration of patient data, interpretation of standardized rest results and clinical data, and clinical decision making, which are documented in the patient's electronic medical record with the full report (date listed below). A copy of the full report will also be mailed to the patient.   The patient expressed understanding of the information reviewed. The patient was provided opportunity to ask questions which were then answered by the provider. The provider worked collaboratively to tailor treatment recommendations to the patient when possible. The patient was informed they could reach out to the provider should additional questions related to the evaluation arise.    The final neuropsychological evaluation report, documented in the patient's chart (Date: 07/13/2023), was amended to reflect any additional information obtained during the feedback appointment including treatment  planning collaboration.    This report was generated using voice recognition software. While this document has been carefully reviewed, transcription errors may be present. I apologize in advance for any inconvenience. Please contact me if further clarification is needed.             Loletta Ripple, PsyD             Neuropsychologist

## 2023-07-31 NOTE — Progress Notes (Signed)
 NEUROPSYCHOLOGICAL EVALUATION [place]  Physical Medicine and Rehabilitation     Patient: Linda Arnold  MRN: [id number] DOB: Aug 01, 1950  Age: 73 y.o. Sex: female  Race/Ethnicity: Black or African American  Years of Education: 14 Handedness: Right  Referring Provider: Wess Hammed, NP  Provider/Clinical Neuropsychologist: Loletta Ripple, PsyD  Date of Service: 07/13/2023 Start Time: 8 AM End Time: 9 AM  Location of Service:  [facility] Physical Medicine & Rehabilitation Department [place] [address], [place] [place], [place] [address] Phone: [phone]  Billing Code/Service: [phone]  Individuals Present: Aleene Hurry, PsyD 1 hour was spent on interpretation of patient data, interpretation of standardized test results and clinical data, clinical decision making, initial treatment planning/recommendations, and report writing. The report will be amended as needed based on any additional information collected during interactive feedback session.   REASON FOR REFERRAL:The patient was referred for neuropsychological evaluation by neurology following a 05/25/2023 follow-up visit to delineate the patient's cognitive functioning due to concerns for memory loss.  The patient scored a 26 out of 30 on the MoCA at that visit.  She scored a 29 out of 30 on the MMSE during follow-ups from the three previous years.  The patient has cardiovascular risk factors including diabetes, hyperlipidemia, and hypertension.  Per records from neurology, the patient was admitted to the hospital in 2020 due to an episode of confusion. 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  05/25/2023 ATN profile was reportedly not consistent with presence of Alzheimer's related pathology (A- T- N-).   Upon interview, the patient indicated she was seeking the neuropsychological evaluation to determine if there was any evidence of a dementia.  She noted some possible decline within the last year although cited significant stress losses of loved ones due to death within this timeframe.  She denied any family concerns about changes in her cognition and described regularly interacting with friends and loved ones.  HISTORY OF PRESENTING CONCERNS:  Cognitive Symptom Onset & Course: Concerns for declines and short-term memory were reported for the last several years and the medical chart.  The patient Avram Boga there may have been a slight decline in recent years.   Current Cognitive Complaints:  Memory: The patient denied any significant difficulties with remembering conversations, recent events, misplacing things, tracking medical appointments, or getting lost in familiar places.  She indicated she has has used a medicine planner for the last 3 years and does so without difficulty.  She denies being told by others that she repeats herself.  She reported that she sometimes forgets what she ate the previous day. Processing Speed: The patient denied noticing any significant changes in her processing speed.  However, she reported that things sometimes take longer for her to recall. Attention & Concentration: She denied any significant difficulties with attention and concentration. Language: The patient endorsed significant difficulties with word-finding.  She denied any problems with understanding others speech.  She denied general problems with expressing herself.  She denied problems with reading or writing.  She reported no difficulties with mental arithmetic Visual-Spatial: She denied any problems judging distance or with navigation. Executive Functioning: She denied any significant  changes in her abilities to problem  solve, make decisions, planning, or stay organized.  She described a slight decrease in her activity level overall over the last year although noted her recent loss of friends due to death last year.  Motor/Sensory Complaints:   Sensory changes: Patient does have a cataract that her medical provider does not wish to do surgery on because of amblyopia and her other eye.  She denied any problems with hearing.  She denied any significant changes in her sense of smell or taste. Balance/coordination difficulties: She denied any problems with balance or walking. Frequent instances of dizziness/vertigo: Denied Other motor difficulties: No problems with tremor.  Emotional and Behavioral Functioning: The patient reported some low mood that she felt was appropriate in the latter half of last year due to loss of loved ones.  She denied feeling sad or blue most days and still gets enjoyment out of previously enjoyable activities.  She indicated that she feels her energy is reduced from her baseline over the last year.  She acknowledged that she goes out a little bit less often.  She denied any significant difficulties with worry or anxiety.  She denied any history of mental health difficulties and has no treatment history.  She denied any history of trauma, hallucinations, paranoia, symptoms of mania, suicidal ideation, or homicidal ideation.  Sleep: Gets at least 6 hours of sleep per night.  Some difficulties with falling asleep and returning to bed after waking that been present for the last 4 to 5 years.  She utilizes her CPAP reliably. Appetite: No changes. Caffeine: 2 cups of coffee each morning. Alcohol Use: Denied. Tobacco Use: Denied. Recreational Substance Use: Denied.  Level of Functional Independence: The patient is intact with basic activities of daily living.  Finances: Intact Shopping / Meal Preparation: Intact Household Maintenance / Chores: Geophysical data processor / Future Obligations:Intact Medication Management: No issues, utilizes pill organizer as of three years ago.    Driving: Independent and reportedly without issue.     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 Family History  Problem Relation Age of Onset   Diabetes Mother        died at age 67   Congestive Heart Failure Mother    Diabetes Sister    Hyperlipidemia Sister    Hypertension Sister    Cancer Brother 19       Lung cancer x2 brothers  Hypertension Brother    Other Father        died in August 24, 1968 from not being able to "pass his urine"   Medications:  Aspirin 81 mg Daily Calcium Citrate 1 tablet 2 times daily Lidocaine  5 % 1 patch Transdermal Every 24 hours, Remove & Discard patch within 12 hours or as directed by  MD Losartan  Potassium 25 mg Oral Daily metFORMIN  HCl 500 MG TAKE 1 TABLET EVERY DAY WITH BREAKFAST Mirabegron  25 mg Oral Daily Patient not taking: Reported on 05/25/2023 Pravastatin  Sodium 20 mg Oral Daily at bedtime   Academic/Vocational History: Graduated high school and completed 2 years of college at [employer].  Reported earning B-'s/B's in school.  Denied any learning difficulties in school.  Employment:Retired age 70. 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   Psychosocial: Marital Status: Widowed 08-24-97) Children/Grandchildren: 1 adult son (limited contact). But has family in the area (sisters, nieces/nephews) Living Situation: Lives alone. Daily Activities/Hobbies: "I talk on the phone all the time," socializing with friends every one to two weeks. Enjoys reading and doing word-searches.    NEUROPSYCHODIAGNOSTIC FINDINGS: Behavioral Observations: The patient was oriented to self, place, and time. Her hearing and vision were adequate for testing. She ambulated independently and without issue. No hand tremor was noted during testing. Her speech was prosodic, fluent, and well-articulated. She displayed no clear indications of word-finding difficulties in conversational speech and no notable paraphasic errors were noted. Receptive language appeared intact. The patient's affect was congruent with mood, mood was largely neutral to positive. The patient was alert and participated in testing as instructed. She was cooperative throughout the session. Her pace was steady. She showed no difficulties with frustration tolerance. Socially, the patient's interactions were unremarkable and consistent with the setting. No frank attentional lapses were appreciated.  Tests Administered: Automatic Data Edition (BNT-2) Brief Visuospatial Memory Test-Revised (BVMT-R) Clock Drawing Test Controlled Oral Word Association Test (FAS & Animals) Delis-Kaplan  Executive Function System (D-KEFS), select subtests Hopkins Verbal Learning Test - Revised (HVLT-R) Neuropsychological Assessment Battery (NAB), select subtest Repeatable Battery for the Assessment of Neuropsychological Status Update (RBANS) Trail Making Test (TMT; Part A & B) Wechsler Adult Intelligence Scale-Fourth Edition (WAIS-IV), select subtests Wechsler Memory Scale-Fourth Edition (WMS-IV) , select subtests Wechsler Test of Adult Reading (WTAR) Geriatric Depression Scale-Short Form (GDS-SF) Geriatric Anxiety Inventory (GAI)  Results: Note: Test scores are relative to age, gender, and educational history as available and appropriate.  Measurement properties of test scores: IQ, Index, and Standard Scores (SS): Mean = 100; Standard Deviation = 15; Scaled Scores (ss): Mean = 10; Standard Deviation = 3; Z scores (Z): Mean = 0; Standard Deviation = 1; T scores (T); Mean = 50; Standard Deviation = 10  Intellectual/Premorbid Functioning Estimate   Norm Score Percentile  Range  Wechsler Test of Adult Reading  SS = 83 13 %ile Low Average  Premorbid cognitive abilities were estimated to be within the low average to average range based on personal history and performance on a word-reading/recognition measure.  ATTENTION AND WORKING MEMORY    Norm Score Percentile  Range  WAIS-IV          Digit Span  ss = 10 50 %ile Average   DSF  ss = 10 50 %ile Average   Span:    7      DSB  ss = 9 37 %ile Average   Span:    5      DSS  ss = 10 50 %ile Average   Span:    5     Basic auditory-verbal attention was scored in the average range overall.  She scored consistently within the average range on subtests involving basic span and working memory.  PROCESSING SPEED    Norm Score Percentile  Range  WAIS-IV          Coding  ss = 8 25 %ile Average  Processing speed, assessed by rapid symbol digit translation, was scored in the average range by age.  LANGUAGE    Norm Score Percentile  Range  NAB Naming   t = 27 1 %ile Exceptionally Low  COWAT          FAS  t = 53 61 %ile Average   Animals  t = 53 61 %ile Average  Word-retrieval/confrontation naming was scored in the exceptionally low score range by age and education. However, the patient scored well within the average range for both phonemic and semantic verbal fluency.    EXECUTIVE FUNCTIONING    Norm Score Percentile  Range  DKEFS - Color-Word Interference          Color Naming  ss = 11 63 %ile Average   Word Reading  ss = 11 63 %ile Average   Inhibition  ss = 10 50 %ile Average   Errors  ss = 9 37 %ile Average   Inhibition Switching  ss = 11 63 %ile Average   Errors  ss = 7 16 %ile Low Average  Trails A  t = 41 18 %ile Low Average   Errors    0     Trails B  t = 47 37 %ile Average   Errors    0     The patient's rapid color naming and rapid color word reading were both average in speed.  Her speed and accuracy was average on the basic response inhibition task.  When response inhibition was combined with set shifting, the patient scored average for speed and low average for accuracy.  The difference between accuracy within the response inhibition only condition and the combined condition was not statistically significant.  The patient's speed on a sequencing task with a visual scanning element was low average, but she made no errors.  Her performance improved on the more difficult combination sequencing and set shifting task and that she scored within the average range and made no errors.   MEMORY    Norm Score Percentile  Range  BVMT-R          Trial 1  t = 32.0 4 %ile Below Average   Trial 2  t = 26 1 %ile Exceptionally Low   Trial 3  t = 22.0 0.2 %ile Exceptionally Low   Total Recall  t = 24.0 0.5 %ile Exceptionally Low   Learning  t = 35.0 7 %ile Below Average   Delayed Recall  t = 27.0 1 %ile Exceptionally Low   % Retained    100 >16 %ile WNL   Hits     >16 %ile WNL   False Alarms     11-16 %ile Low Average   Recognition  Discriminability     >16 %ile WNL  On a visual memory test the patient scored within the exceptionally low score range for total information encoded across three learning trials and her learning performance (gains from repetition) was scored in the below average range.  For total information recalled following a delay she scored  in the exceptionally low score range for free recall, but was intact with percent retention.  Her recall normalized with cueing on the recognition test as seen by intact recognition discriminability which reflected her correctly identifying all target figures and having only one false positive error.   HVLT          Total Recall  t = 25.0 1 %ile Exceptionally Low   Delayed Recall  t = 34 5 %ile Below Average   %Retention  t = 56.0 73 %ile Average   Recognition Discriminability  t = 48.0 42 %ile Average  Wechsler Memory Scale, 4th Edition (WMS-4)         Log. Mem. Immediate Recall  ss = 6 9 %ile Low Average   Logical Memory Delayed Recall  ss = 5 5 %ile Below Average   Logical Recognition    10th-16th %ile %ile Low Average  The patient's immediate recall for a word list was scored in the exceptionally low score range for total words across three learning trials.  Her delayed free recall was scored in the below average range.  However, her percent retention was average and again her memory normalized and cueing with the recognition task (Recognition Disciminability = Average).  A similar pattern was seen on a memory test of two short stories, presented verbally, with low average immediate recall, below average delayed free recall, but recognition of story details in line with her initial encoding.  VISUAL-SPATIAL    Norm Score Percentile  Range  Clock       Intact w/self-correction            RBANS          RBANS Figure Copy  ss = 5 5 %ile Below Average   RBANS Line Orientation      <=2nd %ile %ile Exceptionally Low   The patient's clock drawing showed initial difficulty  with planning/spacing of numbers but she self-corrected and correctly indicated the requested time.  The patient scored in the below average range on her copy of a geometric figure.  Qualitative examination of her drawing showed errors primarily related to accumulation of small errors rather than any significant disorganization or distortion.  The patient's performance on a basic perception task involving judgment of line orientations was scored in the exceptionally low score range.  PERSONALITY AND BEHAVIORAL FUNCTIONING      Score/Interpretation  GDS-SF Raw       2  GDS-SF Severity       Minimal.  GAI Raw       1  GAI Severity       Minimal.  The patient's endorsements on self-report measures of depression and anxiety generated no clinically significant elevations within either domain.  SUMMARY / CLINICAL IMPRESSIONS The patient was referred for neuropsychological evaluation by neurology following a 05/25/2023 follow-up visit to delineate the patient's cognitive functioning due to concerns for memory loss.  The patient scored a 26 out of 30 on the MoCA at that visit.  She scored a 29 out of 30 on the MMSE during follow-ups from the three previous years.  The patient has cardiovascular risk factors including diabetes, hyperlipidemia, and hypertension.  Per records from neurology, the patient was admitted to the hospital in 2020 due to an episode of confusion. 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  05/25/2023 ATN profile was reportedly not consistent with presence of Alzheimer's related pathology (A- T- N-).   Upon interview, the patient indicated she was seeking the neuropsychological evaluation to determine if  there was any evidence of a dementia.  She noted some possible decline within the last year although cited significant stress losses of loved ones due to death within this timeframe.  She denied any family concerns about changes in her cognition and described regularly interacting with friends and loved ones.  The patient described intact independent functioning overall.  Subjective cognitive complaints primarily involves word-finding and mild difficulty with short-term memory.  No significant psychiatric symptoms were reported, although she has experienced some recent losses.  The patient does have a history of cerebrovascular disease and cerebrovascular disease risk factors.  Lab work was negative for Alzheimer's disease pathology.  The patient's cognitive test profile shows evidence of decline, primarily in memory, relative to her estimated baseline.  Memory difficulties were fairly consistent and reflected a dysexecutive profile (reduced learning/encoding, retrieval deficits) rather than an amnestic type memory profile (rapid information loss over time / rapid forgetting). Areas of difficulty in testing also included word-finding/confrontation naming, mild difficulties on one measure of executive functioning (combination inhibition+set-shifting), and basic visual-perception and construction. No significant difficulties were seen in verbal fluency, processing speed, basic attention, and in other areas of executive functioning (inhibition alone, sequencing, set-shifting+sequencing).   The patient's cognitive profile aligns more closely with cerebrovascular disease with the dysexecutive memory profile and mild difficulties in executive functioning. While the word-finding difficulty is significant, she does not show the hallmark amnestic memory deficits or semantic over phonemic bias in verbal fluency (both were intact and identical in normative score) associated with Alzheimer's disease pathology. The patient's  performance on visual-spatial tasks is below expected, but known vision problems warrant caution in interpretation of these findings. At this point, the patient's cognitive profile is likely best explained by cerebrovascular disease, something which aligns with her medical history.   The patient's cognitive difficulties in memory are significant, and this was discussed at length with the patient at the feedback appointment. The provider also inquired more closely about any signs of difficulty with respect to independent functioning. The patient reports intact independent functioning at home, consistent with her initial reports during the intake interview. The Clinical research associate discussed the difference between Major Neurocognitive Disorder (I.e., dementia) and Minor Neurocognitive Disorder (I.e., mild cognitive impairment) with the patient at length. The patient's memory test scores are concerning and fit more with Major Neurocognitive Disorder in terms of severity, but her reported independent functioning appears essentially intact. This could reflect the relative preservation of her abilities in other cognitive domains and improvement in recall seen when she is provided with cues/reminders. The provider indicated they would make a diagnosis of Minor Neurocognitive Disorder, but emphasized that there is significant concern for future decline and strongly recommended that the patient communicate with her social supports about her cognitive changes. The patient describes frequent interactions with family members, and she was receptive to the patient's recommendation that they be kept "in the loop" such that loved ones can speak up if more significant functional declines become apparent. The patient showed understanding of the above information. She was also receptive to re-evaluation in 91-months as a precaution to assess for any changes over time.   Diagnosis: Minor Neurocognitive Disorder.   Recommendations:   Follow-up with the  referring provider, as planned, is recommended.  As discussed, the patient's cognitive difficulties (memory in particular) are concerning, but her intact independent functioning, presentation, and limited breadth of deficits support a Minor Neurocognitive Disorder diagnosis. That said, close monitoring is recommended. Informing loved ones of cognitive changes is recommended such that assistance can be provided should the need arise. Similarly, a re-evaluation in 61-months is recommended. Should there be any sudden declines or significant changes in cognition or independent functioning, a re-evaluation can be conducted sooner if desired.  Continued management of cardiovascular and other chronic health concerns is strongly recommended given the risk poor management poses to cerebrovascular health. Following the recommendations of your medical treatment team is therefore strongly encouraged.  The patient is encouraged to remain active and engaged in her life as cognitive stimulation is beneficial for reducing risk of cognitive decline. Social interaction is particularly beneficial / cognitively stimulating, and the patient's continued socialization activities is encouraged (ie., talking on the phone, visiting with friends, etc.). Engaging in other activities which require some concentration and active participation (word-search, reading) is also encouraged.  Cognitively, the patient benefits from reminders and other external memory aids. Notes, alarms, and visual reminders (leaving a letter clearly visible on the counter that you need to mail) is likely helpful in compensating for memory difficulties.   Having others double check some activities, particularly when safety is a concern (I.e., filling weekly pill organizer), may be beneficial as a precaution.  The patient's performance on testing showed no marked difficulties with speed, attention, or clear problems with set-shifting. Problems  in these areas would raise some concern for driving from a cognitive perspective. That said, vision difficulties can impact safety while driving. The patient is encouraged to use caution with respect to driving, and seek a driving evaluation should there be any safety concerns. Driving evaluations can be arranged through various organizations, including: Scientist, forensic: [phone]; [facility]: [phone]; [employer] in [place]: [phone]; [facility]: [phone] or [phone]. It was a pleasure meeting with you. Please reach out if you have any additional questions about this report or the recommendations.     This report was generated using voice recognition software. While this document has been carefully reviewed, transcription errors may be present. I apologize in advance for any inconvenience. Please contact me if further clarification is needed.             Loletta Ripple, PsyD             Neuropsychologist

## 2023-08-14 ENCOUNTER — Other Ambulatory Visit: Payer: Self-pay | Admitting: Student

## 2023-08-29 ENCOUNTER — Encounter: Payer: Self-pay | Admitting: Podiatry

## 2023-08-29 ENCOUNTER — Ambulatory Visit: Payer: Medicare HMO | Admitting: Podiatry

## 2023-08-29 ENCOUNTER — Ambulatory Visit: Admitting: Podiatry

## 2023-08-29 DIAGNOSIS — M79674 Pain in right toe(s): Secondary | ICD-10-CM | POA: Diagnosis not present

## 2023-08-29 DIAGNOSIS — B351 Tinea unguium: Secondary | ICD-10-CM

## 2023-08-29 DIAGNOSIS — M79675 Pain in left toe(s): Secondary | ICD-10-CM

## 2023-08-29 DIAGNOSIS — E119 Type 2 diabetes mellitus without complications: Secondary | ICD-10-CM

## 2023-08-29 DIAGNOSIS — L84 Corns and callosities: Secondary | ICD-10-CM

## 2023-08-29 NOTE — Progress Notes (Signed)
 This patient returns to my office for at risk foot care.  This patient requires this care by a professional since this patient will be at risk due to having diabetes, This patient is unable to cut nails himself since the patient cannot reach his nails.These nails are painful walking and wearing shoes.  This patient presents for at risk foot care today.  General Appearance  Alert, conversant and in no acute stress.  Vascular  Dorsalis pedis and posterior tibial  pulses are palpable  bilaterally.  Capillary return is within normal limits  bilaterally. Temperature is within normal limits  bilaterally.  Neurologic  Senn-Weinstein monofilament wire test within normal limits  bilaterally. Muscle power within normal limits bilaterally.  Nails Thick disfigured discolored nails with subungual debris  from hallux to fifth toes bilaterally. No evidence of bacterial infection or drainage bilaterally.  Orthopedic  No limitations of motion  feet .  No crepitus or effusions noted.  No bony pathology or digital deformities noted.  Skin  normotropic skin with no porokeratosis noted bilaterally.  No signs of infections or ulcers noted.     Onychomycosis  Pain in right toes  Pain in left toes  Consent was obtained for treatment procedures.   Mechanical debridement of nails 1-5  bilaterally performed with a nail nipper.  Filed with dremel without incident.    Return office visit   3 months                   Told patient to return for periodic foot care and evaluation due to potential at risk complications.   Cordella Bold DPM

## 2023-09-01 NOTE — Progress Notes (Signed)
 1. Failure to attend appointment with reason given    Appointment canceled by provider.

## 2023-09-15 ENCOUNTER — Other Ambulatory Visit: Payer: Self-pay | Admitting: Family Medicine

## 2023-10-09 ENCOUNTER — Ambulatory Visit: Payer: Medicare HMO

## 2023-10-09 VITALS — Ht 63.0 in | Wt 172.0 lb

## 2023-10-09 DIAGNOSIS — Z0000 Encounter for general adult medical examination without abnormal findings: Secondary | ICD-10-CM | POA: Diagnosis not present

## 2023-10-09 NOTE — Progress Notes (Signed)
 Because this visit was a virtual/telehealth visit,  certain criteria was not obtained, such a blood pressure, CBG if applicable, and timed get up and go. Any medications not marked as taking were not mentioned during the medication reconciliation part of the visit. Any vitals not documented were not able to be obtained due to this being a telehealth visit or patient was unable to self-report a recent blood pressure reading due to a lack of equipment at home via telehealth. Vitals that have been documented are verbally provided by the patient.   Subjective:   Linda Arnold is a 73 y.o. who presents for a Medicare Wellness preventive visit.  As a reminder, Annual Wellness Visits don't include a physical exam, and some assessments may be limited, especially if this visit is performed virtually. We may recommend an in-person follow-up visit with your provider if needed.  Visit Complete: Virtual I connected with  Linda Arnold on 10/09/23 by a audio enabled telemedicine application and verified that I am speaking with the correct person using two identifiers.  Patient Location: Home  Provider Location: Home Office  I discussed the limitations of evaluation and management by telemedicine. The patient expressed understanding and agreed to proceed.  Vital Signs: Because this visit was a virtual/telehealth visit, some criteria may be missing or patient reported. Any vitals not documented were not able to be obtained and vitals that have been documented are patient reported.  VideoDeclined- This patient declined Librarian, academic. Therefore the visit was completed with audio only.  Persons Participating in Visit: Patient.  AWV Questionnaire: No: Patient Medicare AWV questionnaire was not completed prior to this visit.  Cardiac Risk Factors include: advanced age (>76men, >80 women);diabetes mellitus;dyslipidemia;hypertension;obesity (BMI >30kg/m2);family history of  premature cardiovascular disease     Objective:    Today's Vitals   10/09/23 1111  Weight: 172 lb (78 kg)  Height: 5' 3 (1.6 m)  PainSc: 0-No pain   Body mass index is 30.47 kg/m.     10/09/2023   11:14 AM 06/27/2023    2:29 PM 01/06/2023    1:09 PM 11/22/2022   11:04 AM 08/18/2022    2:53 PM 08/16/2022    2:06 PM 07/26/2022    3:00 PM  Advanced Directives  Does Patient Have a Medical Advance Directive? No No No No No No No  Would patient like information on creating a medical advance directive? No - Patient declined No - Patient declined No - Patient declined No - Patient declined  No - Patient declined Yes (MAU/Ambulatory/Procedural Areas - Information given)    Current Medications (verified) Outpatient Encounter Medications as of 10/09/2023  Medication Sig   ACCU-CHEK GUIDE TEST test strip USE AS DIRECTED   Alcohol Swabs (DROPSAFE ALCOHOL PREP) 70 % PADS USE FOUR TIMES DAILY AS NEEDED   ASPIRIN 81 PO Take 81 mg by mouth daily.   Blood Glucose Monitoring Suppl (ACCU-CHEK GUIDE) w/Device KIT USE AS DIRECTED   Calcium Citrate (CITRACAL PO) Take 1 tablet by mouth in the morning and at bedtime.   lidocaine  (LIDODERM ) 5 % Place 1 patch onto the skin daily. Remove & Discard patch within 12 hours or as directed by MD   losartan  (COZAAR ) 25 MG tablet TAKE 1 TABLET EVERY DAY   metFORMIN  (GLUCOPHAGE ) 500 MG tablet TAKE 1 TABLET EVERY DAY WITH BREAKFAST   mirabegron  ER (MYRBETRIQ ) 25 MG TB24 tablet Take 1 tablet (25 mg total) by mouth daily.   pravastatin  (PRAVACHOL ) 40 MG  tablet Take 1 tablet (40 mg total) by mouth at bedtime.   No facility-administered encounter medications on file as of 10/09/2023.    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   Past Surgical History:  Procedure Laterality Date   CESAREAN SECTION     TUBAL LIGATION     Family History  Problem Relation Age of Onset   Diabetes Mother        died at age 52   Congestive Heart Failure Mother    Diabetes Sister    Hyperlipidemia Sister    Hypertension Sister    Cancer Brother 35       Lung cancer x2 brothers   Hypertension Brother    Other Father        died in 1968/10/27 from not being able to pass his urine   Social History   Socioeconomic History   Marital status: Widowed    Spouse name: Not on file   Number of children: 1   Years of education: 14   Highest education level: Associate degree: academic program  Occupational History   Occupation: retired  Tobacco Use   Smoking status: Never    Passive exposure: Never   Smokeless tobacco: Never  Vaping Use   Vaping status: Never Used  Substance and Sexual Activity   Alcohol use: Never   Drug use: No   Sexual activity: Not Currently  Other Topics Concern   Not on file  Social History Narrative   Patient lives alone in [place].    Patient is a widower and has one son located in [place].    Patient walks daily for exercise.    Patient enjoys reading, shopping, friends, and walking.    Social Drivers of Corporate investment banker Strain: Low Risk  (10/09/2023)   Overall Financial Resource Strain (CARDIA)    Difficulty of Paying Living Expenses: Not hard at all  Food Insecurity: No Food Insecurity (10/09/2023)   Hunger Vital Sign    Worried About Running Out of Food in the Last Year: Never true    Ran Out of Food in the Last Year: Never true  Transportation Needs: No Transportation Needs (10/09/2023)   PRAPARE - Administrator, Civil Service (Medical): No    Lack of Transportation  (Non-Medical): No  Physical Activity: Sufficiently Active (10/09/2023)   Exercise Vital Sign    Days of Exercise per Week: 5 days    Minutes of Exercise per Session: 30 min  Stress: No Stress Concern Present (10/09/2023)   [employer] of Occupational Health - Occupational Stress Questionnaire    Feeling of Stress: Not at all  Social Connections: Moderately Integrated (10/09/2023)   Social Connection and Isolation Panel    Frequency of Communication with Friends and Family: More than three times a week    Frequency of Social Gatherings with Friends and Family: Twice a week    Attends Religious Services: More than 4 times per year    Active Member of [employer] or Organizations:  Yes    Attends Club or Organization Meetings: 1 to 4 times per year    Marital Status: Widowed    Tobacco Counseling Counseling given: Not Answered    Clinical Intake:  Pre-visit preparation completed: Yes  Pain : No/denies pain Pain Score: 0-No pain     BMI - recorded: 30.47 Nutritional Status: BMI > 30  Obese Nutritional Risks: None Diabetes: Yes CBG done?: No Did pt. bring in CBG monitor from home?: No  Lab Results  Component Value Date   HGBA1C 5.9 06/27/2023   HGBA1C 6.0 (H) 11/22/2022   HGBA1C 6.0 08/18/2022     How often do you need to have someone help you when you read instructions, pamphlets, or other written materials from your doctor or pharmacy?: 1 - Never  Interpreter Needed?: No  Information entered by :: Linda Falco N. Markea Ruzich, LPN.   Activities of Daily Living     10/09/2023   11:15 AM  In your present state of health, do you have any difficulty performing the following activities:  Hearing? 0  Vision? 0  Comment [facility]  Difficulty concentrating or making decisions? 1  Walking or climbing stairs? 0  Dressing or bathing? 0  Doing errands, shopping? 0  Preparing Food and eating ? N  Using the Toilet? N  In the past six months, have you accidently leaked  urine? Y  Do you have problems with loss of bowel control? N  Managing your Medications? N  Managing your Finances? N  Housekeeping or managing your Housekeeping? N    Patient Care Team: Tharon Lung, MD as PCP - General (Family Medicine) Silva Juliene SAUNDERS, DPM as Consulting Physician (Podiatry) Gayland Lauraine PARAS, NP as Nurse Practitioner (Neurology) [facility] as Consulting Physician (Optometry)  I have updated your Care Teams any recent Medical Services you may have received from other providers in the past year.     Assessment:   This is a routine wellness examination for Linda Arnold.  Hearing/Vision screen Hearing Screening - Comments:: Denies hearing difficulties.   Vision Screening - Comments:: Wears rx glasses - up to date with routine eye exams with [facility]    Goals Addressed             This Visit's Progress    10/09/2023: Maintain my health and stay independent.         Depression Screen     10/09/2023   11:16 AM 06/27/2023    2:29 PM 01/24/2023    2:31 PM 12/20/2022    1:30 PM 11/22/2022    2:10 PM 11/22/2022   11:03 AM 08/18/2022    2:53 PM  PHQ 2/9 Scores  PHQ - 2 Score 0 0 0 0 0 0 0  PHQ- 9 Score 2 4 6 6 2 6 5     Fall Risk     10/09/2023   11:13 AM 06/27/2023    2:30 PM 01/24/2023    2:31 PM 11/22/2022    2:57 PM 08/16/2022    2:13 PM  Fall Risk   Falls in the past year? 1 1 0 0 0  Number falls in past yr: 0 1 0 0 0  Injury with Fall? 0 0 0  0  Risk for fall due to :  No Fall Risks     Follow up Education provided;Falls evaluation completed Falls prevention discussed   Falls evaluation completed;Education provided;Falls prevention discussed    MEDICARE RISK AT HOME:  Medicare Risk at  Home Any stairs in or around the home?: No If so, are there any without handrails?: No Home free of loose throw rugs in walkways, pet beds, electrical cords, etc?: Yes Adequate lighting in your home to reduce risk of falls?:  Yes Life alert?: No Use of a cane, walker or w/c?: No Grab bars in the bathroom?: No Shower chair or bench in shower?: Yes Elevated toilet seat or a handicapped toilet?: No  TIMED UP AND GO:  Was the test performed?  No  Cognitive Function: Impaired: Patient has current diagnosis of cognitive impairment.    10/09/2023   11:16 AM 05/24/2022    2:16 PM 05/20/2021   12:47 PM 05/20/2020   12:41 PM 05/20/2019    2:43 PM  MMSE - Mini Mental State Exam  Not completed: Unable to complete      Orientation to time  5 5 5 5   Orientation to Place  5 5 5 5   Registration  3 3 3 3   Attention/ Calculation  4 4 5 5   Recall  3 3 2 3   Language- name 2 objects  2 2 2 2   Language- repeat  1 1 1 1   Language- follow 3 step command  3 3 3 3   Language- read & follow direction  1 1 1 1   Write a sentence  1 1 1 1   Copy design  1 1 1 1   Total score  29 29 29 30       05/25/2023    2:30 PM  Montreal Cognitive Assessment   Visuospatial/ Executive (0/5) 5  Naming (0/3) 2  Attention: Read list of digits (0/2) 2  Attention: Read list of letters (0/1) 1  Attention: Serial 7 subtraction starting at 100 (0/3) 3  Language: Repeat phrase (0/2) 1  Language : Fluency (0/1) 1  Abstraction (0/2) 2  Delayed Recall (0/5) 3  Orientation (0/6) 6  Total 26  Adjusted Score (based on education) 26      08/16/2022    2:12 PM 11/04/2020    3:48 PM  6CIT Screen  What Year? 0 points 0 points  What month? 0 points 0 points  What time? 0 points 0 points  Count back from 20 0 points 0 points  Months in reverse 0 points 0 points  Repeat phrase 0 points 0 points  Total Score 0 points 0 points    Immunizations Immunization History  Administered Date(s) Administered   Fluad Quad(high Dose 65+) 12/01/2020, 11/25/2021   Fluad Trivalent(High Dose 65+) 11/22/2022   Influenza Split 03/15/2012   Influenza, High Dose Seasonal PF 11/27/2017, 11/20/2018   Influenza,inj,Quad PF,6+ Mos 11/22/2012   Moderna Sars-Covid-2  Vaccination 06/19/2019, 07/17/2019   PFIZER Comirnaty(Gray Top)Covid-19 Tri-Sucrose Vaccine 04/07/2020   PNEUMOCOCCAL CONJUGATE-20 03/15/2022   Pfizer Covid-19 Vaccine Bivalent Booster 36yrs & up 02/19/2021   Pneumococcal Polysaccharide-23 10/01/2004, 11/22/2012   Td 10/01/2004   Zoster Recombinant(Shingrix) 07/19/2023, 09/18/2023    Screening Tests Health Maintenance  Topic Date Due   DTaP/Tdap/Td (2 - Tdap) 10/02/2014   OPHTHALMOLOGY EXAM  03/15/2023   Diabetic kidney evaluation - Urine ACR  03/16/2023   INFLUENZA VACCINE  10/06/2023   COVID-19 Vaccine (5 - 2024-25 season) 12/06/2023 (Originally 11/06/2022)   Fecal DNA (Cologuard)  12/10/2023   HEMOGLOBIN A1C  12/27/2023   FOOT EXAM  04/24/2024   Diabetic kidney evaluation - eGFR measurement  06/26/2024   Medicare Annual Wellness (AWV)  10/08/2024   MAMMOGRAM  03/30/2025   Pneumococcal Vaccine: 50+ Years  Completed   DEXA SCAN  Completed   Hepatitis C Screening  Completed   Zoster Vaccines- Shingrix  Completed   Hepatitis B Vaccines  Aged Out   HPV VACCINES  Aged Out   Meningococcal B Vaccine  Aged Out    Health Maintenance  Health Maintenance Due  Topic Date Due   DTaP/Tdap/Td (2 - Tdap) 10/02/2014   OPHTHALMOLOGY EXAM  03/15/2023   Diabetic kidney evaluation - Urine ACR  03/16/2023   INFLUENZA VACCINE  10/06/2023   Health Maintenance Items Addressed: Yes Patient aware of current care gaps. NCIR was reviewed and chart updated.    Additional Screening:  Vision Screening: Recommended annual ophthalmology exams for early detection of glaucoma and other disorders of the eye. Would you like a referral to an eye doctor? No    Dental Screening: Recommended annual dental exams for proper oral hygiene  Community Resource Referral / Chronic Care Management: CRR required this visit?  No   CCM required this visit?  No   Plan:    I have personally reviewed and noted the following in the patient's chart:   Medical and  social history Use of alcohol, tobacco or illicit drugs  Current medications and supplements including opioid prescriptions. Patient is not currently taking opioid prescriptions. Functional ability and status Nutritional status Physical activity Advanced directives List of other physicians Hospitalizations, surgeries, and ER visits in previous 12 months Vitals Screenings to include cognitive, depression, and falls Referrals and appointments  In addition, I have reviewed and discussed with patient certain preventive protocols, quality metrics, and best practice recommendations. A written personalized care plan for preventive services as well as general preventive health recommendations were provided to patient.   Roz LOISE Fuller, LPN   03/11/7972   After Visit Summary: (Declined) Due to this being a telephonic visit, with patients personalized plan was offered to patient but patient Declined AVS at this time   Notes: Patient aware of current care gaps. NCIR was reviewed and chart updated.

## 2023-10-09 NOTE — Patient Instructions (Addendum)
 Ms. Linda Arnold , Thank you for taking time out of your busy schedule to complete your Annual Wellness Visit with me. I enjoyed our conversation and look forward to speaking with you again next year. I, as well as your care team,  appreciate your ongoing commitment to your health goals. Please review the following plan we discussed and let me know if I can assist you in the future. Your Game plan/ To Do List    Referrals: If you haven't heard from the office you've been referred to, please reach out to them at the phone provided.   Follow up Visits: We will see or speak with you next year for your Next Medicare AWV with our clinical staff Have you seen your provider in the last 6 months (3 months if uncontrolled diabetes)? Yes  Clinician Recommendations:  Aim for 30 minutes of exercise or brisk walking, 6-8 glasses of water, and 5 servings of fruits and vegetables each day.       This is a list of the screenings recommended for you:  Health Maintenance  Topic Date Due   DTaP/Tdap/Td vaccine (2 - Tdap) 10/02/2014   Eye exam for diabetics  03/15/2023   Yearly kidney health urinalysis for diabetes  03/16/2023   Flu Shot  10/06/2023   COVID-19 Vaccine (5 - 2024-25 season) 12/06/2023*   Cologuard (Stool DNA test)  12/10/2023   Hemoglobin A1C  12/27/2023   Complete foot exam   04/24/2024   Yearly kidney function blood test for diabetes  06/26/2024   Medicare Annual Wellness Visit  10/08/2024   Mammogram  03/30/2025   Pneumococcal Vaccine for age over 39  Completed   DEXA scan (bone density measurement)  Completed   Hepatitis C Screening  Completed   Zoster (Shingles) Vaccine  Completed   Hepatitis B Vaccine  Aged Out   HPV Vaccine  Aged Out   Meningitis B Vaccine  Aged Out  *Topic was postponed. The date shown is not the original due date.    Advanced directives: (Declined) Advance directive discussed with you today. Even though you declined this today, please call our office should you  change your mind, and we can give you the proper paperwork for you to fill out. Advance Care Planning is important because it:  [x]  Makes sure you receive the medical care that is consistent with your values, goals, and preferences  [x]  It provides guidance to your family and loved ones and reduces their decisional burden about whether or not they are making the right decisions based on your wishes.  Follow the link provided in your after visit summary or read over the paperwork we have mailed to you to help you started getting your Advance Directives in place. If you need assistance in completing these, please reach out to us  so that we can help you!  See attachments for Preventive Care and Fall Prevention Tips.

## 2023-10-18 DIAGNOSIS — R3121 Asymptomatic microscopic hematuria: Secondary | ICD-10-CM | POA: Diagnosis not present

## 2023-10-18 DIAGNOSIS — N3941 Urge incontinence: Secondary | ICD-10-CM | POA: Diagnosis not present

## 2023-10-23 ENCOUNTER — Other Ambulatory Visit: Payer: Self-pay | Admitting: Family Medicine

## 2023-11-07 ENCOUNTER — Encounter: Payer: Self-pay | Admitting: Family Medicine

## 2023-11-07 ENCOUNTER — Ambulatory Visit (INDEPENDENT_AMBULATORY_CARE_PROVIDER_SITE_OTHER): Admitting: Family Medicine

## 2023-11-07 VITALS — BP 129/73 | HR 72 | Ht 63.0 in | Wt 177.0 lb

## 2023-11-07 DIAGNOSIS — L299 Pruritus, unspecified: Secondary | ICD-10-CM

## 2023-11-07 DIAGNOSIS — M1711 Unilateral primary osteoarthritis, right knee: Secondary | ICD-10-CM | POA: Diagnosis not present

## 2023-11-07 DIAGNOSIS — Z7689 Persons encountering health services in other specified circumstances: Secondary | ICD-10-CM

## 2023-11-07 DIAGNOSIS — R82998 Other abnormal findings in urine: Secondary | ICD-10-CM | POA: Diagnosis not present

## 2023-11-07 DIAGNOSIS — E119 Type 2 diabetes mellitus without complications: Secondary | ICD-10-CM

## 2023-11-07 DIAGNOSIS — Z23 Encounter for immunization: Secondary | ICD-10-CM

## 2023-11-07 DIAGNOSIS — Z0000 Encounter for general adult medical examination without abnormal findings: Secondary | ICD-10-CM

## 2023-11-07 LAB — POCT GLYCOSYLATED HEMOGLOBIN (HGB A1C): HbA1c, POC (controlled diabetic range): 5.8 % (ref 0.0–7.0)

## 2023-11-07 NOTE — Patient Instructions (Signed)
 Your A1c is perfect. Let me know when you want to stop the metformin . Ask your urologist about the Gemtesa and itching. We will do more labs for this. Let me know if youd like an injection for the knee. Be sure to use good sleep hygiene to sleep better.

## 2023-11-07 NOTE — Progress Notes (Unsigned)
    SUBJECTIVE:   CHIEF COMPLAINT / HPI:   T2DM Needs updated A1c today. Remains on metformin  500 mg daily along with pravastatin  40 mg daily without issues. She would like to stay on this for now.  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 She has been taking Gemtesa which she feels could make her itchy and making her urinate more. She continues to have bubbling in her urine.  R knee pain Just started today. Knows she has arthritis. She uses bengay and voltaren for this helps. Not too bothersome today.  Sleeping issues Sometimes will wake up in the middle of the night. She gets to sleep today. She does not use a huge blanket. She still watches TV right before bed. She has a snack before bed like an orange. She does have to get up to urinate in the night x3.  Itching Occasionally will have this at different times of the day. Started after starting Gemtesa.  She notices some red bumps on the arm and back, but these have went away. No fevers, night sweats, weight loss.  PERTINENT  PMH / PSH: ***  OBJECTIVE:   BP 129/73   Pulse 72   Ht 5' 3 (1.6 m)   Wt 177 lb (80.3 kg)   SpO2 100%   BMI 31.35 kg/m   General: Alert and oriented, in NAD Skin: Warm, dry, and intact without lesions HEENT: NCAT, EOM grossly normal, midline nasal septum Cardiac: RRR, no m/r/g appreciated Respiratory: CTAB, breathing and speaking comfortably on RA Abdominal: Soft, nontender, nondistended, normoactive bowel sounds Extremities: Moves all extremities grossly equally Neurological: No gross focal deficit Psychiatric: Appropriate mood and affect   ASSESSMENT/PLAN:   Assessment & Plan Diabetes mellitus type 2, noninsulin dependent (HCC) A1c 5.8 and stable. Continue metformin   Pruritus    My eye doctor on [address]  Stuart Redo, MD [facility]

## 2023-11-07 NOTE — Assessment & Plan Note (Signed)
 A1c 5.8 and stable. Continue metformin  after shared decision making with patient.  Can likely come off in the future if continues to be controlled.

## 2023-11-08 NOTE — Assessment & Plan Note (Signed)
 Will send message to healthcare maintenance pool to obtain her ophthalmology records from [facility].

## 2023-11-09 ENCOUNTER — Other Ambulatory Visit

## 2023-11-09 DIAGNOSIS — L299 Pruritus, unspecified: Secondary | ICD-10-CM | POA: Diagnosis not present

## 2023-11-10 ENCOUNTER — Ambulatory Visit: Payer: Self-pay | Admitting: Family Medicine

## 2023-11-10 LAB — COMPREHENSIVE METABOLIC PANEL WITH GFR
ALT: 22 IU/L (ref 0–32)
AST: 25 IU/L (ref 0–40)
Albumin: 4.3 g/dL (ref 3.8–4.8)
Alkaline Phosphatase: 92 IU/L (ref 44–121)
BUN/Creatinine Ratio: 17 (ref 12–28)
BUN: 15 mg/dL (ref 8–27)
Bilirubin Total: 0.3 mg/dL (ref 0.0–1.2)
CO2: 23 mmol/L (ref 20–29)
Calcium: 9 mg/dL (ref 8.7–10.3)
Chloride: 104 mmol/L (ref 96–106)
Creatinine, Ser: 0.87 mg/dL (ref 0.57–1.00)
Globulin, Total: 2.1 g/dL (ref 1.5–4.5)
Glucose: 90 mg/dL (ref 70–99)
Potassium: 4.3 mmol/L (ref 3.5–5.2)
Sodium: 143 mmol/L (ref 134–144)
Total Protein: 6.4 g/dL (ref 6.0–8.5)
eGFR: 70 mL/min/1.73 (ref >59)

## 2023-11-10 LAB — CBC WITH DIFFERENTIAL/PLATELET
Basophils Absolute: 0 x10E3/uL (ref 0.0–0.2)
Basos: 1 %
EOS (ABSOLUTE): 0.1 x10E3/uL (ref 0.0–0.4)
Eos: 1 %
Hematocrit: 41.6 % (ref 34.0–46.6)
Hemoglobin: 13.4 g/dL (ref 11.1–15.9)
Immature Grans (Abs): 0 x10E3/uL (ref 0.0–0.1)
Immature Granulocytes: 0 %
Lymphocytes Absolute: 1.6 x10E3/uL (ref 0.7–3.1)
Lymphs: 25 %
MCH: 31.4 pg (ref 26.6–33.0)
MCHC: 32.2 g/dL (ref 31.5–35.7)
MCV: 97 fL (ref 79–97)
Monocytes Absolute: 0.7 x10E3/uL (ref 0.1–0.9)
Monocytes: 10 %
Neutrophils Absolute: 4.1 x10E3/uL (ref 1.4–7.0)
Neutrophils: 63 %
Platelets: 166 x10E3/uL (ref 150–450)
RBC: 4.27 x10E6/uL (ref 3.77–5.28)
RDW: 12.2 % (ref 11.7–15.4)
WBC: 6.4 x10E3/uL (ref 3.4–10.8)

## 2023-11-10 LAB — IRON,TIBC AND FERRITIN PANEL
Ferritin: 126 ng/mL (ref 15–150)
Iron Saturation: 24 % (ref 15–55)
Iron: 75 ug/dL (ref 27–139)
Total Iron Binding Capacity: 311 ug/dL (ref 250–450)
UIBC: 236 ug/dL (ref 118–369)

## 2023-11-10 LAB — TSH: TSH: 1.75 u[IU]/mL (ref 0.450–4.500)

## 2023-11-15 LAB — MICROALBUMIN / CREATININE URINE RATIO

## 2023-11-21 ENCOUNTER — Telehealth: Payer: Self-pay

## 2023-11-21 NOTE — Telephone Encounter (Signed)
 Received call from [facility] in regards to Urine Microalbumin Creatinine.  She reports a lab processing error and the sample will need to re recollected.   Will forward to PCP to see if this can be done at next office visit.   If sooner, please place a future order.

## 2023-11-28 ENCOUNTER — Ambulatory Visit: Payer: Medicare HMO | Admitting: Podiatry

## 2023-11-28 VITALS — Ht 63.0 in | Wt 177.0 lb

## 2023-11-28 DIAGNOSIS — M79675 Pain in left toe(s): Secondary | ICD-10-CM | POA: Diagnosis not present

## 2023-11-28 DIAGNOSIS — E119 Type 2 diabetes mellitus without complications: Secondary | ICD-10-CM | POA: Diagnosis not present

## 2023-11-28 DIAGNOSIS — B351 Tinea unguium: Secondary | ICD-10-CM

## 2023-11-28 DIAGNOSIS — M79674 Pain in right toe(s): Secondary | ICD-10-CM

## 2023-11-28 DIAGNOSIS — L84 Corns and callosities: Secondary | ICD-10-CM | POA: Diagnosis not present

## 2023-11-29 ENCOUNTER — Ambulatory Visit: Admitting: Podiatry

## 2023-11-29 ENCOUNTER — Encounter: Payer: Self-pay | Admitting: Podiatry

## 2023-11-29 NOTE — Progress Notes (Signed)
  Subjective:  Patient ID: Linda Arnold, female    DOB: 10-Jun-1950,  MRN: [id number]  Linda Arnold presents to clinic today for: preventative diabetic foot care and callus(es) of both feet and painful mycotic toenails that are difficult to trim. Painful toenails interfere with ambulation. Aggravating factors include wearing enclosed shoe gear. Pain is relieved with periodic professional debridement. Painful calluses are aggravated when weightbearing with and without shoegear. Pain is relieved with periodic professional debridement.  Chief Complaint  Patient presents with   Nail Problem    Rm 15 RFC.  Patient is diabetic (A1C 5.9). PCP-Dr. Elna Redo, last visit 11/07/23.    PCP is Redo Elna, MD.  Allergies  Allergen Reactions   Betadine [Povidone Iodine] Rash   Lisinopril  Rash    Review of Systems: Negative except as noted in the HPI.  Objective: No changes noted in today's physical examination. There were no vitals filed for this visit.  Linda Arnold is a pleasant 73 y.o. female in NAD. AAO x 3.  Vascular Examination: Capillary refill time <3 seconds b/l LE. Palpable pedal pulses b/l LE. Digital hair present b/l. No pedal edema b/l. Skin temperature gradient WNL b/l. No varicosities b/l. No cyanosis or clubbing. No ischemia or gangrene. .  Dermatological Examination: Pedal skin with normal turgor, texture and tone b/l. No open wounds. No interdigital macerations b/l. Toenails 1-5 b/l thickened, discolored, dystrophic with subungual debris. There is pain on palpation to dorsal aspect of nailplates. Hyperkeratotic lesion(s) submet head 5 b/l.  No erythema, no edema, no drainage, no fluctuance.  Neurological Examination: Protective sensation intact with 10 gram monofilament b/l LE. Vibratory sensation intact b/l LE.   Musculoskeletal Examination: Muscle strength 5/5 to all lower extremity muscle groups bilaterally. HAV with bunion deformity noted b/l LE.Linda Arnold No pain,  crepitus or joint limitation noted with ROM b/l LE.     Latest Ref Rng & Units 11/07/2023    1:45 PM 06/27/2023    2:35 PM  Hemoglobin A1C  Hemoglobin-A1c 0.0 - 7.0 % 5.8  5.9    Assessment/Plan: 1. Pain due to onychomycosis of toenails of both feet   2. Callus of foot   3. Diabetes mellitus type 2, noninsulin dependent [facility])   Consent given for treatment. Patient examined. All patient's and/or POA's questions/concerns addressed on today's visit. Mycotic toenails 1-5 debrided in length and girth without incident. Callus(es) submet head 5 b/l pared with sharp debridement without incident. Continue foot and shoe inspections daily. Monitor blood glucose per PCP/Endocrinologist's recommendations.Continue soft, supportive shoe gear daily. Report any pedal injuries to medical professional. Call office if there are any quesitons/concerns. Return in about 3 months (around 02/27/2024).  Linda Arnold, DPM       LOCATION: [address] [place] [address]                   Office [phone]   [facility] LOCATION: [address] [place], [place] [address] Office [phone]

## 2023-11-30 NOTE — Progress Notes (Signed)
 Linda Arnold                                          MRN: [id number]   11/30/2023   The [facility] Quality Team Specialist reviewed this patient medical record for the purposes of chart review for care gap closure. The following were reviewed: chart review for care gap closure-colorectal cancer screening.    [facility] Quality Team

## 2023-11-30 NOTE — Progress Notes (Signed)
 Linda Arnold                                          MRN: [id number]   11/30/2023   The [facility] Quality Team Specialist reviewed this patient medical record for the purposes of chart review for care gap closure. The following were reviewed: abstraction for care gap closure-glycemic status assessment.    [facility] Quality Team

## 2023-11-30 NOTE — Progress Notes (Signed)
 Linda Arnold                                          MRN: [id number]   11/30/2023   The [facility] Quality Team Specialist reviewed this patient medical record for the purposes of chart review for care gap closure. The following were reviewed: abstraction for care gap closure-controlling blood pressure.    [facility] Quality Team

## 2024-01-04 ENCOUNTER — Other Ambulatory Visit: Payer: Self-pay | Admitting: Family Medicine

## 2024-02-06 ENCOUNTER — Other Ambulatory Visit: Payer: Self-pay | Admitting: Family Medicine

## 2024-02-06 DIAGNOSIS — Z1231 Encounter for screening mammogram for malignant neoplasm of breast: Secondary | ICD-10-CM

## 2024-02-21 ENCOUNTER — Other Ambulatory Visit: Payer: Self-pay | Admitting: Family Medicine

## 2024-02-27 ENCOUNTER — Encounter: Payer: Self-pay | Admitting: Family Medicine

## 2024-02-27 ENCOUNTER — Ambulatory Visit: Admitting: Family Medicine

## 2024-02-27 VITALS — BP 132/70 | HR 87 | Ht 63.0 in | Wt 176.4 lb

## 2024-02-27 DIAGNOSIS — E119 Type 2 diabetes mellitus without complications: Secondary | ICD-10-CM

## 2024-02-27 DIAGNOSIS — R3914 Feeling of incomplete bladder emptying: Secondary | ICD-10-CM

## 2024-02-27 DIAGNOSIS — Z1211 Encounter for screening for malignant neoplasm of colon: Secondary | ICD-10-CM | POA: Diagnosis not present

## 2024-02-27 LAB — POCT URINALYSIS DIP (MANUAL ENTRY)
Bilirubin, UA: NEGATIVE
Blood, UA: NEGATIVE
Glucose, UA: NEGATIVE mg/dL
Ketones, POC UA: NEGATIVE mg/dL
Leukocytes, UA: NEGATIVE
Nitrite, UA: NEGATIVE
Protein Ur, POC: NEGATIVE mg/dL
Spec Grav, UA: <=1.005 — AB
Urobilinogen, UA: 0.2 U/dL
pH, UA: 5.5

## 2024-02-27 NOTE — Patient Instructions (Signed)
 VISIT SUMMARY: You came in today because of abdominal pain that happens when you urinate and increased frequency of urination, especially at night.  YOUR PLAN: URINARY SYMPTOMS: You have been experiencing intermittent abdominal pain during urination and increased frequency of urination. -We have ordered a urinalysis to check for a urinary tract infection (UTI) and to assess your kidney function. -Please monitor the color of your urine and try to adjust your fluid intake so that your urine is a light yellow color. -If the urinalysis does not show a UTI, we may consider adjusting the dosage of your medication, Gemtesa. -Try to reduce your caffeine intake to help decrease the frequency of urination.  Please let me know if you have any other questions.  Dr. Tharon

## 2024-02-27 NOTE — Assessment & Plan Note (Signed)
 Urine microalb/cr collected.

## 2024-02-27 NOTE — Progress Notes (Signed)
" ° °  SUBJECTIVE:   CHIEF COMPLAINT / HPI:  Discussed the use of AI scribe software for clinical note transcription with the patient, who gave verbal consent to proceed.  History of Present Illness Linda Arnold is a 73 year old female who presents with abdominal pain associated with urination.  Suprapubic pain associated with urination - Intermittent suprapubic pain for 1.5 months - Occurs only while sitting on the toilet and urinating - Pain resolves before urination is finished - Nonradiating - Episodes have occurred two to three times since making the appointment  Urinary frequency and nocturia - Increased urinary frequency - Frequency improves with reduced evening fluid intake - Nocturia persists despite taking Gemtesa for urinary symptoms though is improved  Associated and negative symptoms - No dysuria - No fever - No nausea or vomiting - No constipation or diarrhea - No vaginal bulging   OBJECTIVE:  BP 132/70   Pulse 87   Ht 5' 3 (1.6 m)   Wt 176 lb 6.4 oz (80 kg)   SpO2 100%   BMI 31.25 kg/m   Physical Exam GENERAL: Alert, cooperative, well developed, no acute distress HEENT: Normocephalic, normal oropharynx, moist mucous membranes ABDOMEN: Soft, non-tender, non-distended, without organomegaly, normal bowel sounds, no costovertebral angle tenderness NEUROLOGICAL: Cranial nerves grossly intact, moves all extremities without gross motor or sensory deficit  ASSESSMENT/PLAN:   Assessment & Plan Sensation as if bladder still full Along with abdominal pain only before voids while sitting on toilet. Increased urinary frequency though has history on Mybetriq. No true dysuria. Differential includes UTI and pelvic floor weakness. Previous kidney function tests normal. - Ordered urinalysis for UTI and kidney function. - Advised monitoring urine color and adjusting fluid intake for light yellow urine. - Discussed potential Myrbetriq  dosage adjustment if no UTI. -  Advised reducing caffeine intake to decrease urination frequency. - If persistent, would recommend return to urology and possible pelvic exam to assess for pelvic organ prolapse Diabetes mellitus type 2, noninsulin dependent (HCC) Urine microalb/cr collected. Screening for colon cancer Cologuard ordered.  Stuart Redo, MD [facility] "

## 2024-02-28 LAB — MICROALBUMIN / CREATININE URINE RATIO
Creatinine, Urine: 16.1 mg/dL
Microalb/Creat Ratio: <19 mg/g{creat} (ref 0–29)
Microalbumin, Urine: <3 ug/mL

## 2024-02-29 ENCOUNTER — Ambulatory Visit: Payer: Self-pay | Admitting: Family Medicine

## 2024-03-19 ENCOUNTER — Encounter: Payer: Self-pay | Admitting: Podiatry

## 2024-03-19 ENCOUNTER — Ambulatory Visit: Admitting: Podiatry

## 2024-03-19 DIAGNOSIS — B351 Tinea unguium: Secondary | ICD-10-CM

## 2024-03-19 DIAGNOSIS — L84 Corns and callosities: Secondary | ICD-10-CM

## 2024-03-19 DIAGNOSIS — M79675 Pain in left toe(s): Secondary | ICD-10-CM

## 2024-03-19 DIAGNOSIS — M79674 Pain in right toe(s): Secondary | ICD-10-CM

## 2024-03-19 DIAGNOSIS — E119 Type 2 diabetes mellitus without complications: Secondary | ICD-10-CM | POA: Diagnosis not present

## 2024-03-21 LAB — COLOGUARD: COLOGUARD: NEGATIVE

## 2024-03-25 NOTE — Progress Notes (Signed)
"  °  Subjective:  Patient ID: Linda Arnold, female    DOB: 1950/11/21,  MRN: [id number]  Linda Arnold presents to clinic today for preventative diabetic foot care and callus(es) b/l feet and painful thick toenails that are difficult to trim. Painful toenails interfere with ambulation. Aggravating factors include wearing enclosed shoe gear. Pain is relieved with periodic professional debridement. Painful calluses are aggravated when weightbearing with and without shoegear. Pain is relieved with periodic professional debridement.  Chief Complaint  Patient presents with   Diabetes    My toes and corn  Saw Dr. Elna Mabe - 02/27/2024; A1c - 6.2   New problem(s): None.   PCP is Tharon Elna, MD.  Allergies[1]  Review of Systems: Negative except as noted in the HPI.  Objective: No changes noted in today's physical examination. There were no vitals filed for this visit. Linda Arnold is a pleasant 74 y.o. female in NAD. AAO x 3.  Vascular Examination: Capillary refill time <3 seconds b/l LE. Palpable pedal pulses b/l LE. Digital hair present b/l. No pedal edema b/l. Skin temperature gradient WNL b/l. No varicosities b/l. No cyanosis or clubbing. No ischemia or gangrene. .  Dermatological Examination: Pedal skin with normal turgor, texture and tone b/l. No open wounds. No interdigital macerations b/l. Toenails 1-5 b/l thickened, discolored, dystrophic with subungual debris. There is pain on palpation to dorsal aspect of nailplates. Hyperkeratotic lesion(s) submet head 5 b/l.  No erythema, no edema, no drainage, no fluctuance.  Neurological Examination: Protective sensation intact with 10 gram monofilament b/l LE. Vibratory sensation intact b/l LE.   Musculoskeletal Examination: Muscle strength 5/5 to all lower extremity muscle groups bilaterally. HAV with bunion deformity noted b/l LE.SABRA No pain, crepitus or joint limitation noted with ROM b/l LE.  Assessment/Plan: 1. Pain due to  onychomycosis of toenails of both feet   2. Callus of foot   3. Diabetes mellitus type 2, noninsulin dependent (HCC)   Patient was evaluated and treated. All patient's and/or POA's questions/concerns addressed on today's visit. Toenails 1-5 b/l debrided in length and girth without incident. Callus(es) submet head 5 left foot and submet head 5 right foot pared with sharp debridement without incident. Continue daily foot inspections and monitor blood glucose per PCP/Endocrinologist's recommendations. Continue soft, supportive shoe gear daily. Report any pedal injuries to medical professional. Call office if there are any questions/concerns.  Return in about 3 months (around 06/17/2024).  Delon LITTIE Merlin, DPM       LOCATION: [address] [place] [address]                   Office [phone]   [place] LOCATION: [address] [place], [place] [address] Office [phone]     [1]  Allergies Allergen Reactions   Betadine [Povidone Iodine] Rash   Lisinopril  Rash   "

## 2024-04-01 ENCOUNTER — Ambulatory Visit

## 2024-04-04 ENCOUNTER — Ambulatory Visit

## 2024-05-10 ENCOUNTER — Ambulatory Visit

## 2024-05-30 ENCOUNTER — Ambulatory Visit: Admitting: Neurology

## 2024-06-25 ENCOUNTER — Ambulatory Visit: Admitting: Podiatry
